# Patient Record
Sex: Female | Born: 1960 | Race: Asian | Hispanic: No | Marital: Married | Smoking: Never smoker
Health system: Southern US, Community
[De-identification: ages and names within clinical notes are randomized; demographics above are authoritative.]

## PROBLEM LIST (undated history)

## (undated) DIAGNOSIS — L719 Rosacea, unspecified: Secondary | ICD-10-CM

## (undated) DIAGNOSIS — I10 Essential (primary) hypertension: Secondary | ICD-10-CM

## (undated) DIAGNOSIS — J45909 Unspecified asthma, uncomplicated: Secondary | ICD-10-CM

## (undated) DIAGNOSIS — T7840XA Allergy, unspecified, initial encounter: Secondary | ICD-10-CM

## (undated) DIAGNOSIS — Z780 Asymptomatic menopausal state: Secondary | ICD-10-CM

## (undated) DIAGNOSIS — K219 Gastro-esophageal reflux disease without esophagitis: Secondary | ICD-10-CM

## (undated) DIAGNOSIS — Z9289 Personal history of other medical treatment: Secondary | ICD-10-CM

## (undated) DIAGNOSIS — E785 Hyperlipidemia, unspecified: Secondary | ICD-10-CM

## (undated) HISTORY — PX: COLONOSCOPY: SHX174

## (undated) HISTORY — PX: HEMORRHOID SURGERY: SHX153

## (undated) HISTORY — PX: UPPER GASTROINTESTINAL ENDOSCOPY: SHX188

## (undated) HISTORY — DX: Gastro-esophageal reflux disease without esophagitis: K21.9

## (undated) HISTORY — DX: Asymptomatic menopausal state: Z78.0

## (undated) HISTORY — DX: Essential (primary) hypertension: I10

## (undated) HISTORY — DX: Hyperlipidemia, unspecified: E78.5

## (undated) HISTORY — PX: OTHER SURGICAL HISTORY: SHX169

## (undated) HISTORY — DX: Unspecified asthma, uncomplicated: J45.909

## (undated) HISTORY — DX: Allergy, unspecified, initial encounter: T78.40XA

## (undated) HISTORY — PX: NASAL SINUS SURGERY: SHX719

## (undated) HISTORY — DX: Personal history of other medical treatment: Z92.89

## (undated) HISTORY — DX: Rosacea, unspecified: L71.9

---

## 1999-11-27 ENCOUNTER — Other Ambulatory Visit: Admission: RE | Admit: 1999-11-27 | Discharge: 1999-11-27 | Payer: Self-pay | Admitting: Obstetrics and Gynecology

## 2004-01-24 ENCOUNTER — Ambulatory Visit (HOSPITAL_COMMUNITY): Admission: RE | Admit: 2004-01-24 | Discharge: 2004-01-24 | Payer: Self-pay | Admitting: Family Medicine

## 2005-01-24 ENCOUNTER — Ambulatory Visit (HOSPITAL_COMMUNITY): Admission: RE | Admit: 2005-01-24 | Discharge: 2005-01-24 | Payer: Self-pay | Admitting: Family Medicine

## 2006-03-06 ENCOUNTER — Encounter (INDEPENDENT_AMBULATORY_CARE_PROVIDER_SITE_OTHER): Payer: Self-pay | Admitting: Specialist

## 2006-03-06 ENCOUNTER — Ambulatory Visit (HOSPITAL_COMMUNITY): Admission: RE | Admit: 2006-03-06 | Discharge: 2006-03-06 | Payer: Self-pay | Admitting: General Surgery

## 2006-03-24 ENCOUNTER — Ambulatory Visit (HOSPITAL_COMMUNITY): Admission: RE | Admit: 2006-03-24 | Discharge: 2006-03-24 | Payer: Self-pay | Admitting: *Deleted

## 2007-02-22 ENCOUNTER — Ambulatory Visit: Payer: Self-pay | Admitting: Internal Medicine

## 2007-02-22 LAB — CONVERTED CEMR LAB
ALT: 34 units/L (ref 0–35)
Albumin: 4.5 g/dL (ref 3.5–5.2)
Basophils Absolute: 0 10*3/uL (ref 0.0–0.1)
CO2: 21 meq/L (ref 19–32)
Chloride: 105 meq/L (ref 96–112)
Glucose, Bld: 118 mg/dL — ABNORMAL HIGH (ref 70–99)
HCT: 41.3 % (ref 36.0–46.0)
Lymphocytes Relative: 33 % (ref 12–46)
Lymphs Abs: 2.9 10*3/uL (ref 0.7–3.3)
Neutro Abs: 4.7 10*3/uL (ref 1.7–7.7)
Neutrophils Relative %: 54 % (ref 43–77)
Platelets: 242 10*3/uL (ref 150–400)
Potassium: 3.7 meq/L (ref 3.5–5.3)
RDW: 14.9 % — ABNORMAL HIGH (ref 11.5–14.0)
Sodium: 137 meq/L (ref 135–145)
TSH: 2.147 microintl units/mL (ref 0.350–5.50)
Total Bilirubin: 0.3 mg/dL (ref 0.3–1.2)
Total Protein: 8.2 g/dL (ref 6.0–8.3)
WBC: 8.8 10*3/uL (ref 4.0–10.5)

## 2007-02-23 ENCOUNTER — Ambulatory Visit: Payer: Self-pay | Admitting: *Deleted

## 2007-05-11 ENCOUNTER — Ambulatory Visit (HOSPITAL_COMMUNITY): Admission: RE | Admit: 2007-05-11 | Discharge: 2007-05-11 | Payer: Self-pay | Admitting: *Deleted

## 2007-05-24 ENCOUNTER — Ambulatory Visit: Payer: Self-pay | Admitting: Internal Medicine

## 2007-05-24 LAB — CONVERTED CEMR LAB
Cholesterol: 261 mg/dL — ABNORMAL HIGH (ref 0–200)
Total CHOL/HDL Ratio: 5.6

## 2008-07-17 ENCOUNTER — Ambulatory Visit (HOSPITAL_COMMUNITY): Admission: RE | Admit: 2008-07-17 | Discharge: 2008-07-17 | Payer: Self-pay | Admitting: Family Medicine

## 2010-07-07 ENCOUNTER — Encounter: Payer: Self-pay | Admitting: Family Medicine

## 2010-07-07 ENCOUNTER — Encounter: Payer: Self-pay | Admitting: *Deleted

## 2010-07-17 DIAGNOSIS — Z9289 Personal history of other medical treatment: Secondary | ICD-10-CM

## 2010-07-17 HISTORY — DX: Personal history of other medical treatment: Z92.89

## 2010-07-23 ENCOUNTER — Ambulatory Visit (INDEPENDENT_AMBULATORY_CARE_PROVIDER_SITE_OTHER): Payer: 59 | Admitting: Internal Medicine

## 2010-07-23 ENCOUNTER — Encounter: Payer: Self-pay | Admitting: Internal Medicine

## 2010-07-23 DIAGNOSIS — J3089 Other allergic rhinitis: Secondary | ICD-10-CM

## 2010-07-23 DIAGNOSIS — R079 Chest pain, unspecified: Secondary | ICD-10-CM

## 2010-07-23 DIAGNOSIS — R42 Dizziness and giddiness: Secondary | ICD-10-CM | POA: Insufficient documentation

## 2010-07-23 DIAGNOSIS — J302 Other seasonal allergic rhinitis: Secondary | ICD-10-CM | POA: Insufficient documentation

## 2010-07-23 DIAGNOSIS — E785 Hyperlipidemia, unspecified: Secondary | ICD-10-CM | POA: Insufficient documentation

## 2010-07-24 ENCOUNTER — Ambulatory Visit (INDEPENDENT_AMBULATORY_CARE_PROVIDER_SITE_OTHER)
Admission: RE | Admit: 2010-07-24 | Discharge: 2010-07-24 | Disposition: A | Discharge status: Home / Self Care | Payer: 59 | Source: Ambulatory Visit | Attending: Internal Medicine | Admitting: Internal Medicine

## 2010-07-24 ENCOUNTER — Other Ambulatory Visit: Payer: Self-pay | Admitting: Internal Medicine

## 2010-07-24 DIAGNOSIS — R079 Chest pain, unspecified: Secondary | ICD-10-CM

## 2010-07-26 ENCOUNTER — Other Ambulatory Visit (INDEPENDENT_AMBULATORY_CARE_PROVIDER_SITE_OTHER): Payer: 59

## 2010-07-26 ENCOUNTER — Encounter (INDEPENDENT_AMBULATORY_CARE_PROVIDER_SITE_OTHER): Payer: Self-pay | Admitting: *Deleted

## 2010-07-26 ENCOUNTER — Other Ambulatory Visit: Payer: Self-pay | Admitting: Internal Medicine

## 2010-07-26 DIAGNOSIS — R079 Chest pain, unspecified: Secondary | ICD-10-CM

## 2010-07-26 DIAGNOSIS — E785 Hyperlipidemia, unspecified: Secondary | ICD-10-CM

## 2010-07-26 LAB — BASIC METABOLIC PANEL
Calcium: 8.8 mg/dL (ref 8.4–10.5)
GFR: 121.1 mL/min (ref >60.00)
Potassium: 4.5 mEq/L (ref 3.5–5.1)
Sodium: 136 mEq/L (ref 135–145)

## 2010-07-26 LAB — LIPID PANEL
Cholesterol: 235 mg/dL — ABNORMAL HIGH (ref 0–200)
HDL: 57.6 mg/dL (ref >39.00)
Total CHOL/HDL Ratio: 4
Triglycerides: 83 mg/dL (ref 0.0–149.0)

## 2010-07-26 LAB — CBC WITH DIFFERENTIAL/PLATELET
Basophils Absolute: 0 10*3/uL (ref 0.0–0.1)
HCT: 39.2 % (ref 36.0–46.0)
Lymphs Abs: 2.3 10*3/uL (ref 0.7–4.0)
MCV: 87.7 fl (ref 78.0–100.0)
Monocytes Absolute: 0.5 10*3/uL (ref 0.1–1.0)
Monocytes Relative: 7.2 % (ref 3.0–12.0)
Neutrophils Relative %: 60.3 % (ref 43.0–77.0)
Platelets: 282 10*3/uL (ref 150.0–400.0)
RDW: 13.6 % (ref 11.5–14.6)
WBC: 7.4 10*3/uL (ref 4.5–10.5)

## 2010-07-29 ENCOUNTER — Encounter (INDEPENDENT_AMBULATORY_CARE_PROVIDER_SITE_OTHER): Payer: Self-pay | Admitting: *Deleted

## 2010-07-29 ENCOUNTER — Telehealth: Payer: Self-pay | Admitting: Internal Medicine

## 2010-07-30 ENCOUNTER — Encounter (INDEPENDENT_AMBULATORY_CARE_PROVIDER_SITE_OTHER): Payer: Self-pay | Admitting: *Deleted

## 2010-08-01 NOTE — Assessment & Plan Note (Signed)
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 her BP was elevated at 184/84.  Since that episode, she had   3 or 4 more episodes of dizziness and chest discomfort, usually at work when she is active. No symptoms at rest  The discomfort was mild, located at the left anterior chest, usually lasts one minute, sometimes she feels like it radiates to the left shoulder and the neck symptoms are not associated with palpitations or SOB Worse with eating? Her BP has been rechecked by herself several times and is within normal.  ROS No fever She has developed cough but only for the last 3 days She has developed heartburn and some upper abdominal discomfort but only for the last 2 days No nausea, vomiting, diarrhea No lower extremity edema or leg pain. No recent airplane trips No slurred speech, face weakness or motor deficits LMP 1 week ago   Preventive Screening-Counseling & Management  Alcohol-Tobacco     Smoking Status: never  Caffeine-Diet-Exercise     Does Patient Exercise: no      Drug Use:  no.    Current Medications (verified): 1)  None  Allergies (verified): 1)  ! Pcn  Past History:  Family History: Last updated: 07/23/2010 Diabetes -- M High cholesterol-mother Hypertension--mother stroke-- M MI--no Breast cancer----sister dx  ~ age 17  Gastric  Cancer-Father  Social History: Last updated: 07/23/2010 Married 2 children, 4 and 17 y/o Never Smoked Alcohol use-no Drug use-no Regular exercise-no but very active at work  Risk Factors: Exercise: no (07/23/2010)  Risk Factors: Smoking Status: never (07/23/2010)  Past Medical History: Allergic rhinitis Hyperlipidemia, mild not on BCP  Past Surgical History: "eardrum" surgery sinus surgery   Family History: Diabetes -- M High cholesterol-mother Hypertension--mother stroke-- M MI--no Breast cancer----sister dx  ~ age 43  Gastric Cancer-Father  Social History: Married 2 children, 9 and 34 y/o Never Smoked Alcohol use-no Drug use-no Regular exercise-no but very active at work Smoking Status:  never Drug Use:  no Does Patient Exercise:  no  Physical Exam  General:  alert and well-developed.   Neck:  no masses and no thyromegaly.   Lungs:  normal respiratory effort, no intercostal retractions, no accessory muscle use, and normal breath sounds.   Heart:  normal rate, regular rhythm, no murmur, and no gallop.   Abdomen:  soft, non-tender, no distention, no masses, no guarding, and no rigidity.   Extremities:  no lower extremity edema, calves  are symmetric and not tender Neurologic:  alert & oriented X3, cranial nerves II-XII intact, strength normal in all extremities, and gait normal.   Psych:  Oriented X3, memory intact for recent and remote, normally interactive, good eye contact, not anxious appearing, and not depressed appearing.  Impression & Recommendations:  Problem # 1:  CHEST PAIN (ICD-26.78)  51 year old lady who presents with episodes of dizziness and chest pain. Her cardiovascular risk are  : sedentary lifestyle, history of high cholesterol. no family history of MI, she does not smoke  EKG today -- no acute or worrisome changes  Plan: chest x-ray Labs Aspirin daily Stress test ER if symptoms severe.  Orders: T-2 View CXR  (71020TC) Cardiology Referral (Cardiology)  Problem # 2:  DIZZINESS (ICD-780.4) see #1, she has dizziness in the context of many other symptoms including chest pain. Neurological exam normal.  Complete Medication List: 1)  Aspirin 81 Mg Tbec (Aspirin) .... One by mouth daily  Patient Instructions: 1)  chest x-ray 2)  please contact fasting Within one week 3)  FLP, CBC, BMP--- dx CP 4)  call or go to the ER if you have  severe dizziness or chest pain 5)  Please schedule a follow-up appointment in 1 month.    Orders Added: 1)  T-2 View CXR [71020TC] 2)  New Patient Level III [[address]] 3)  Cardiology Referral [Cardiology]     Risk Factors:  Tobacco use:  never Drug use:  no Alcohol use:  no Exercise:  no

## 2010-08-06 ENCOUNTER — Telehealth (INDEPENDENT_AMBULATORY_CARE_PROVIDER_SITE_OTHER): Payer: Self-pay | Admitting: *Deleted

## 2010-08-07 ENCOUNTER — Encounter: Payer: Self-pay | Admitting: Cardiovascular Disease

## 2010-08-07 ENCOUNTER — Ambulatory Visit (HOSPITAL_COMMUNITY): Payer: 59 | Attending: Cardiology

## 2010-08-07 DIAGNOSIS — R079 Chest pain, unspecified: Secondary | ICD-10-CM

## 2010-08-07 NOTE — Letter (Signed)
Summary: Unable To Reach-Consult Scheduled  [facility] [place]  [address] [place], [place] [address]   Phone: [phone]  Fax: [phone]    07/29/2010 MRN: [id number]    Dear Ms. Ciaravino,   We have been unable to reach you by phone.  Please contact our office with an updated phone number.  If you have any question please call us.     Thank you,  Army Fossa CMA  July 29, 2010 11:17 AM

## 2010-08-07 NOTE — Progress Notes (Signed)
Summary: PEER TO PEER REQUEST  Phone Note Outgoing Call   Call placed by: Magdalen Spatz [facility],  July 29, 2010 10:09 AM Call placed to: Database administrator of Call: IN REFERENCE TO CARDIO/NUC STRESS TEST REFERRAL, [employer] INS[employer] DID NOT APPROVE BY PHONE.  ALL CLINICALS WERE FAXED TO CLINICAL REVIEW.  PER PHONE CALL W/SECURE HORIZONS, CLINICALS DID NOT MEET GUIDELINES FOR APPROVAL, NOW REQUIRING "PEER TO PEER", BY CALLING [phone], OPTION 4.  Initial call taken by: Magdalen Spatz [facility]  July 29, 2010 10:09 AM  Follow-up for Phone Call        please call and let me talk to them [facility] E. Osei Anger MD  July 29, 2010 1:08 PM   Additional Follow-up for Phone Call Additional follow up Details #1::        Renee-- do you have the case #, they want let me do anything without this. Army Fossa CMA,  July 29, 2010 4:25 PM  THE CASE # IS [id number]. Magdalen Spatz [facility]  July 29, 2010 4:36 PM     Additional Follow-up for Phone Call Additional follow up Details #2::    discussed with the company doctor, test approved. The approval number is as follows: [id number] please schedule test [facility] E. Prateek Knipple MD  July 29, 2010 4:47 PM

## 2010-08-07 NOTE — Letter (Signed)
Summary: Primary Care Consult Scheduled Letter  [facility] [place]  [address] [place], [place] [address]   Phone: [phone]  Fax: [phone]      07/30/2010 MRN: [id number]  [facility] [address] [place], [place]  [address]  [place]    Dear Ms. Pelzer,    We have scheduled an appointment for you.  At the recommendation of Dr. Willow Ora, we have scheduled you for a Cardiac Stress Test with Selena Batten on 08-07-2010 at 12:30pm.  Their address is [address], [address] floor, [place] [address]. The office phone number is [phone].  If this appointment day and time is not convenient for you, please feel free to call the office of the doctor you are being referred to at the number listed above and reschedule the appointment.    It is important for you to keep your scheduled appointments. We are here to make sure you are given good patient care.   Thank you,    Renee, Patient Care Coordinator [place]

## 2010-08-08 ENCOUNTER — Telehealth: Payer: Self-pay | Admitting: Internal Medicine

## 2010-08-13 NOTE — Progress Notes (Signed)
Summary: Nuclear Pre-Procedure  Phone Note Outgoing Call   Call placed by: Stanton Kidney, EMT-P,  August 06, 2010 2:54 PM Summary of Call: No Current Phone Number.... Unable to leave message with information on Myoview Information Sheet (see scanned document for details). Stanton Kidney, EMT-P  August 06, 2010 2:54 PM      Nuclear Med Background Indications for Stress Test: Evaluation for Ischemia     Symptoms: Chest Pain, Diaphoresis, Dizziness    Nuclear Pre-Procedure Height (in): 60.25

## 2010-08-13 NOTE — Progress Notes (Signed)
Summary: Updated info  Phone Note Call from Patient Call back at Home Phone [phone]   Summary of Call: Patient called the office stating that she was returning a call. Please advise. Initial call taken by: Lucious Groves CMA,  August 08, 2010 9:32 AM  Follow-up for Phone Call        Left message for pt to call back-- I do not see where anyone has called the pt. Army Fossa CMA  August 08, 2010 9:35 AM   Additional Follow-up for Phone Call Additional follow up Details #1::        Patient contact info had changed and was corrected. No further action required.  Additional Follow-up by: Lucious Groves CMA,  August 08, 2010 11:30 AM

## 2010-08-13 NOTE — Assessment & Plan Note (Addendum)
Summary: Cardiology Nuclear Testing--negative,+ hypertension  Nuclear Med Background Indications for Stress Test: Evaluation for Ischemia   History: Asthma   Symptoms: Chest Pain, Chest Tightness with Exertion, Diaphoresis, Dizziness, DOE, Fatigue, Fatigue with Exertion, Rapid HR, SOB  Symptoms Comments: Last CP 3 weeks ago.   Nuclear Pre-Procedure Cardiac Risk Factors: Hypertension, Lipids, NIDDM Caffeine/Decaff Intake: 530AM NPO After: 12:00 PM Lungs: Clear IV 0.9% NS with Angio Cath: 22g     IV Site: R Hand IV Started by: Bonnita Levan, RN Chest Size (in) 38     Cup Size D     Height (in): 60.25 Weight (lb): 152 BMI: 29.55  Nuclear Med Study 1 or 2 day study:  1 day     Stress Test Type:  Stress Reading MD:  Charlton Haws, MD     Referring MD:  J.Paz Resting Radionuclide:  Technetium 70m Tetrofosmin     Resting Radionuclide Dose:  11 mCi  Stress Radionuclide:  Technetium 25m Tetrofosmin     Stress Radionuclide Dose:  33 mCi   Stress Protocol Exercise Time (min):  8:00 min     Max HR:  160 bpm     Predicted Max HR:  173 bpm  Max Systolic BP: 218 mm Hg     Percent Max HR:  92.49 %     METS: 10.1 Rate Pressure Product:  04540    Stress Test Technologist:  Irean Hong,  RN     Nuclear Technologist:  Doyne Keel, CNMT  Rest Procedure  Myocardial perfusion imaging was performed at rest 45 minutes following the intravenous administration of Technetium 67m Tetrofosmin.  Stress Procedure  The patient exercised for eight minutes, RPE=15.  The patient stopped due to DOE   and denied any chest pain.  There were nonspecific ST-T wave changes, rare PVC. The patient had a hypertensive response to exercise.  Technetium 72m Tetrofosmin was injected at peak exercise and myocardial perfusion imaging was performed after a brief delay.  QPS Raw Data Images:  Normal; no motion artifact; normal heart/lung ratio. Stress Images:  Normal homogeneous uptake in all areas of the  myocardium. Rest Images:  Normal homogeneous uptake in all areas of the myocardium. Subtraction (SDS):  Normal Transient Ischemic Dilatation:  .90  (Normal <1.22)  Lung/Heart Ratio:  .34  (Normal <0.45)  Quantitative Gated Spect Images QGS EDV:  48 ml QGS ESV:  12 ml QGS EF:  76 % QGS cine images:  normal  Findings Normal nuclear study      Overall Impression  Exercise Capacity: Good exercise capacity. BP Response: Hypertensive blood pressure response. Clinical Symptoms: Dyspnea ECG Impression: No significant ST segment change suggestive of ischemia. Overall Impression: Normal stress nuclear study. Overall Impression Comments: Hypertensive at rest and stess  Appended Document: Cardiology Nuclear Testing advise patient: stress test was negative Followup next month as recommended  Appended Document: Stress Test   left message for pt to call back. Army Fossa CMA  August 12, 2010 10:13 AM  Patient notified. Lucious Groves CMA  August 12, 2010 4:39 PM

## 2010-08-23 ENCOUNTER — Ambulatory Visit (INDEPENDENT_AMBULATORY_CARE_PROVIDER_SITE_OTHER): Payer: 59 | Admitting: Internal Medicine

## 2010-08-23 ENCOUNTER — Encounter: Payer: Self-pay | Admitting: Internal Medicine

## 2010-08-23 DIAGNOSIS — I10 Essential (primary) hypertension: Secondary | ICD-10-CM

## 2010-08-23 DIAGNOSIS — E119 Type 2 diabetes mellitus without complications: Secondary | ICD-10-CM | POA: Insufficient documentation

## 2010-08-23 DIAGNOSIS — R7309 Other abnormal glucose: Secondary | ICD-10-CM

## 2010-08-23 DIAGNOSIS — R079 Chest pain, unspecified: Secondary | ICD-10-CM

## 2010-09-03 NOTE — Assessment & Plan Note (Signed)
Summary: 1 month followup///sph   Vital Signs:  Patient profile:   50 year old female Weight:      155.38 pounds Pulse rate:   86 / minute Pulse rhythm:   regular BP sitting:   126 / 84  (left arm) Cuff size:   regular  Vitals Entered By: Army Fossa CMA (August 23, 2010 9:02 AM) CC: 1 Month f/u- not fasting  Comments [employer]   History of Present Illness:  followup  she was seen recently with a number of symptoms including chest pain and dizziness Since the last office visit, no further symptoms.  she is doing better as far as diet    review of systems   denies anxiety  she occasionally has hot flashes  Has not checked her blood pressure lately    Current Medications (verified): 1)  Aspirin 81 Mg Tbec (Aspirin) .... One By Mouth Daily  Allergies (verified): 1)  ! Pcn  Past History:  Past Medical History: Reviewed history from 07/23/2010 and no changes required. Allergic rhinitis Hyperlipidemia, mild not on BCP  Past Surgical History: Reviewed history from 07/23/2010 and no changes required. "eardrum" surgery sinus surgery   Social History: Reviewed history from 07/23/2010 and no changes required. Married 2 children, 4 and 59 y/o Never Smoked Alcohol use-no Drug use-no Regular exercise-no but very active at work  Physical Exam  General:  alert and well-developed.   Lungs:  normal respiratory effort, no intercostal retractions, no accessory muscle use, and normal breath sounds.   Heart:  normal rate, regular rhythm, no murmur, and no gallop.     Impression & Recommendations:  Problem # 1:  CHEST PAIN (ICD-786.50)  recent episodes of chest pain and dizziness, resolved stress test negative, she did have increased blood pressure at that time.  plan: No further workup,  I wonder if the symptoms were anxiety related or hot  flashes.  Problem # 2:  HYPERLIPIDEMIA (ICD-272.4)  her last  cholesterol numbers were discussed, recommend diet and  exercise Labs Reviewed: SGOT: 30 (02/22/2007)   SGPT: 34 (02/22/2007)   HDL:57.60 (07/26/2010), 47 (05/24/2007)  LDL:172 (05/24/2007)  Chol:235 (07/26/2010), 261 (05/24/2007)  Trig:83.0 (07/26/2010), 210 (05/24/2007)  Problem # 3:  HYPERGLYCEMIA (ICD-790.29)  mild hyperglycemia, patient aware, recommend diet and exercise  Problem # 4:  ESSENTIAL HYPERTENSION, BENIGN (ICD-401.1)   mild elevated blood pressure  note that during her stress test Start on low-dose of coreg , see instructions  Her updated medication list for this problem includes:    Carvedilol 6.25 Mg Tabs (Carvedilol) .Marland Kitchen... 1 by mouth two times a day  Complete Medication List: 1)  Aspirin 81 Mg Tbec (Aspirin) .... One by mouth daily 2)  Carvedilol 6.25 Mg Tabs (Carvedilol) .Marland Kitchen.. 1 by mouth two times a day  Patient Instructions: 1)  Check your blood pressure 2 or 3 times a week. If it is more than 140/85 consistently,please let us know  2)  Please schedule a follow-up appointment in 4 months . Fasting physical  Prescriptions: CARVEDILOL 6.25 MG TABS (CARVEDILOL) 1 by mouth two times a day  #60 x 6   Entered and Authorized by:   Elita Quick E. Shadee Rathod MD   Signed by:   Nolon Rod. Mayara Paulson MD on 08/23/2010   Method used:   Print then Give to Patient   RxID:   [id number]    Orders Added: 1)  Est. Patient Level III [[address]]

## 2010-11-01 NOTE — Op Note (Signed)
Barbara Flores, Barbara Flores               ACCOUNT NO.:  [id number]   MEDICAL RECORD NO.:  [id number]          PATIENT TYPE:  AMB   LOCATION:  DAY                          FACILITY:  [facility]   PHYSICIAN:  Timothy E. Earlene Plater, M.D. DATE OF BIRTH:  [birth year]   DATE OF PROCEDURE:  03/08/2006  DATE OF DISCHARGE:  03/06/2006                                 OPERATIVE REPORT   PREOPERATIVE DIAGNOSIS:  Internal and external hemorrhoids.   POSTOPERATIVE DIAGNOSIS:  Internal and external hemorrhoids.   PROCEDURE:  Complex hemorrhoidectomy.   SURGEON:  Timothy E. Earlene Plater, M.D.   ANESTHESIA:  General.   This patient is otherwise healthy, perhaps overweight.  She does not give a  GI history that is abnormal, and she has since pregnancies developed rather  large, unpleasant hemorrhoids with frequent pain, swelling, burning and  bright red blood.  After careful discussion, she wishes to proceed with this  surgery.  It has been carefully discussed.  She was seen, identified and a  permit signed today.   She was taken to the operating room and placed supine and LMA anesthesia  provided.  She was placed in lithotomy, the perianal area inspected, prepped  and draped in the usual fashion.  The hemorrhoidal component of the problem  was extensive with a large, shaggy right anterior external hemorrhoid  extending across the anterior midline.  There was also an internal  component.  Left anterior there was a small internal and a moderate  external, and left posterior there was a large external.  I injected the  anus around and about with Marcaine, epinephrine and Wydase and massaged  that in well.  I placed the anoscope in the anorectum and then removed the  larger right anterior internal and external component, modified the external  component to remove the remaining excess skin along the anterior rim of the  anus.  The left anterior internal hemorrhoid was removed and oversewn with a  2-0 chromic.  The right  anterior hemorrhoid had been repaired with 2-0  chromic and 3-0 chromic external.  The large flap of skin in the left  posterior area was all external.  I incised it.  I removed all the  superficial varicosities and then trimmed the edges so that they would lay  smooth and flat at the anal opening.  This was closed also with 3-0 chromic.  This completed the procedure.  The anus was adequate, accepting the  operating anoscope.  The sphincter was intact.  There was no bleeding.  Gelfoam and dry sterile dressing applied.  She tolerated it well, count was  correct, and she was removed to the recovery room in good condition.   Written and verbal instructions were given, including Vicodin #48.  She will  be seen and followed as an outpatient.      Timothy E. Earlene Plater, M.D.  Electronically Signed    TED/MEDQ  D:  03/06/2006  T:  03/09/2006  Job:  [id number]

## 2010-12-23 ENCOUNTER — Encounter: Payer: Self-pay | Admitting: Internal Medicine

## 2010-12-23 ENCOUNTER — Ambulatory Visit (INDEPENDENT_AMBULATORY_CARE_PROVIDER_SITE_OTHER): Payer: 59 | Admitting: Internal Medicine

## 2010-12-23 DIAGNOSIS — R7309 Other abnormal glucose: Secondary | ICD-10-CM

## 2010-12-23 DIAGNOSIS — I10 Essential (primary) hypertension: Secondary | ICD-10-CM

## 2010-12-23 DIAGNOSIS — Z0000 Encounter for general adult medical examination without abnormal findings: Secondary | ICD-10-CM

## 2010-12-23 DIAGNOSIS — Z23 Encounter for immunization: Secondary | ICD-10-CM

## 2010-12-23 DIAGNOSIS — R079 Chest pain, unspecified: Secondary | ICD-10-CM

## 2010-12-23 DIAGNOSIS — R739 Hyperglycemia, unspecified: Secondary | ICD-10-CM

## 2010-12-23 LAB — TSH: TSH: 1.18 u[IU]/mL (ref 0.35–5.50)

## 2010-12-23 LAB — AST: AST: 21 U/L (ref 0–37)

## 2010-12-23 LAB — HEMOGLOBIN A1C: Hgb A1c MFr Bld: 6.2 % (ref 4.6–6.5)

## 2010-12-23 LAB — LDL CHOLESTEROL, DIRECT: Direct LDL: 206.5 mg/dL

## 2010-12-23 LAB — ALT: ALT: 20 U/L (ref 0–35)

## 2010-12-23 NOTE — Progress Notes (Signed)
  Subjective:    Patient ID: Barbara Flores, female    DOB: 28-May-1961, 50 y.o.   MRN: [id number]  HPI Complete physical exam, doing well  Past Medical History  Diagnosis Date  . Allergic rhinitis   . Hyperlipidemia   . History of exercise stress test 07-2010    normal except for elevated BP    Past Surgical History  Procedure Date  . Eardrum surgery   . Nasal sinus surgery     Family History: Diabetes -- M High cholesterol-mother Hypertension--mother stroke-- M MI--no Breast cancer----sister dx ~ age 73  Gastric Cancer-Father Colon ca--no  Social History: Married 2 children, 16 and 107 y/o Never Smoked Alcohol use-no Drug use-no Regular exercise-no but very active at work Smoking Status:  never Drug Use:  no Does Patient Exercise:  no  Review of Systems Ambulatory blood pressure is 138-140/70 Checks her BP several times a week Was recently seen with chest pain and dizziness, stress test negative, no further symptoms. No nausea, vomiting, diarrhea or blood in the stools. No GERD symptoms or abdominal pain. No dysuria or gross hematuria, and uterus are normal and regular. Lost 3 pounds in the last 4 months, is trying to eat better. She remains active at work, no routine exercises .      Objective:   Physical Exam  Constitutional: She is oriented to person, place, and time. She appears well-developed and well-nourished. No distress.  HENT:  Head: Normocephalic and atraumatic.  Neck: No thyromegaly present.  Cardiovascular: Normal rate, regular rhythm and normal heart sounds.   No murmur heard. Pulmonary/Chest: Effort normal and breath sounds normal. No respiratory distress. She has no wheezes. She has no rales.  Abdominal: Soft. She exhibits no distension. There is no tenderness. There is no rebound and no guarding.  Musculoskeletal: She exhibits no edema.  Lymphadenopathy:    She has no cervical adenopathy.  Neurological: She is alert and oriented to person,  place, and time.  Skin: Skin is warm and dry.  Psychiatric: She has a normal mood and affect. Her behavior is normal. Judgment and thought content normal.          Assessment & Plan:

## 2010-12-23 NOTE — Assessment & Plan Note (Addendum)
Td today Never had a colonoscopy, no family history of colon cancer Has not seen her gynecologist since 2008, she has a family history of breast cancer, strongly encouraged to see her gynecologist. Schedule mammogram? States she will call and make an appointment History of mildly elevated cholesterol and blood sugar---->  will check labs.  She has already starting to eating better and loose weight. Encouraged to continue doing so.

## 2010-12-23 NOTE — Patient Instructions (Signed)
Continue eating healthy and loos weight Check the  blood pressure 2 or 3 times a week, be sure it is less than 140/85. If it is consistently higher, let me know

## 2010-12-23 NOTE — Assessment & Plan Note (Signed)
Symptoms resolve, stress test negative-2012. She did have slightly elevated blood pressure

## 2010-12-23 NOTE — Assessment & Plan Note (Addendum)
Mildly elevated BP at time of stress test. Ambulatory BPs ok, See instructions

## 2010-12-26 ENCOUNTER — Telehealth: Payer: Self-pay | Admitting: *Deleted

## 2010-12-26 NOTE — Telephone Encounter (Signed)
Pt states that she has s/e to Lipitor she has been on this in the past.

## 2010-12-26 NOTE — Telephone Encounter (Signed)
Message left for patient to return my call.  

## 2010-12-26 NOTE — Telephone Encounter (Signed)
Message copied by Leanne Lovely on Thu Dec 26, 2010  3:09 PM ------      Message from: Barbara Flores      Created: Wed Dec 25, 2010  5:38 PM       Advised patient:      Her cholesterol is definitely increased, the bad cholesterol , LDL, has gotten worse. Recommend to start Lipitor 20 mg one by mouth each bedtime; she's not on birth control, needs to stop Lipitor if she suspected pregnancy.       She also has developed mild diabetes, she does not need medication but needs to improve her diet. Please arrange for dietitian referral.      Recommend to come back to the office in 3 months for a routine office visit, fasting.      Other labs wnl

## 2010-12-27 NOTE — Telephone Encounter (Signed)
Message left for patient to return my call.  

## 2010-12-27 NOTE — Telephone Encounter (Signed)
Change to pravachol 40 mg 1 po qhs

## 2010-12-30 MED ORDER — PRAVASTATIN SODIUM 40 MG PO TABS: 40.0000 mg | ORAL_TABLET | Freq: Every evening | ORAL | Status: DC

## 2010-12-30 NOTE — Telephone Encounter (Signed)
Pt is aware. She is also taking Red Yeast rice would like for me to let you know.

## 2010-12-30 NOTE — Telephone Encounter (Signed)
noted 

## 2011-03-23 ENCOUNTER — Other Ambulatory Visit: Payer: Self-pay | Admitting: Internal Medicine

## 2011-03-24 NOTE — Telephone Encounter (Signed)
Done

## 2011-04-17 ENCOUNTER — Ambulatory Visit (INDEPENDENT_AMBULATORY_CARE_PROVIDER_SITE_OTHER): Payer: BC Managed Care – PPO | Admitting: Internal Medicine

## 2011-04-17 ENCOUNTER — Encounter: Payer: Self-pay | Admitting: Internal Medicine

## 2011-04-17 VITALS — BP 128/70 | HR 69 | Temp 98.3°F | Resp 18 | Ht 60.0 in | Wt 148.1 lb

## 2011-04-17 DIAGNOSIS — J069 Acute upper respiratory infection, unspecified: Secondary | ICD-10-CM | POA: Insufficient documentation

## 2011-04-17 DIAGNOSIS — E785 Hyperlipidemia, unspecified: Secondary | ICD-10-CM

## 2011-04-17 DIAGNOSIS — E119 Type 2 diabetes mellitus without complications: Secondary | ICD-10-CM

## 2011-04-17 NOTE — Assessment & Plan Note (Signed)
Report that previously Lipitor causes headaches Pravachol caused feet cramps, self discontinued around 03-2011 She also discontinue red yeast rice Plan: Change to crestor, samples provided, reports no side effects from previous experience

## 2011-04-17 NOTE — Assessment & Plan Note (Signed)
Has changed her diet, is losing weight. Doing great

## 2011-04-17 NOTE — Assessment & Plan Note (Addendum)
Having a URI (?), see instructions. Also her BP was elevated today, related to over-the-counter medications?Marland Kitchen Would recommend simply Mucinex DM.

## 2011-04-17 NOTE — Progress Notes (Signed)
  Subjective:    Patient ID: Barbara Flores, female    DOB: [birth year], 50 y.o.   MRN: [id number]  HPI she developed what she thinks is a cold 2 days ago: Ringing in both ears, slightly dizzy, started to take TheraFlu over-the-counter. This morning she checked her blood pressure and reportedly was 229/84, she is here because he is concerned about her blood pressure. Additionally, she discontinue Pravachol due to  feet cramps  Past Medical History  Diagnosis Date  . Allergic rhinitis   . Hyperlipidemia   . History of exercise stress test 07-2010    normal except for elevated BP  . Diabetes mellitus 08/23/2010   Past Surgical History  Procedure Date  . Eardrum surgery   . Nasal sinus surgery     Review of Systems No fever, mild chills. No nausea, vomiting, cough. Very mild headache, no chest pain.    Objective:   Physical Exam  Constitutional: She is oriented to person, place, and time. She appears well-developed and well-nourished. No distress.  HENT:  Head: Normocephalic and atraumatic.       Face is symmetric, nontender to palpation. Tympanic membranes without redness or discharge. Nose is slightly congested. Throat without redness.  Eyes: EOM are normal. Pupils are equal, round, and reactive to light.  Cardiovascular: Normal rate, regular rhythm and normal heart sounds.   No murmur heard. Pulmonary/Chest: Effort normal and breath sounds normal. No respiratory distress. She has no wheezes. She has no rales.  Neurological: She is alert and oriented to person, place, and time.       Speech, gait and motor are normal  Skin: She is not diaphoretic.  Psychiatric: She has a normal mood and affect. Her behavior is normal. Judgment and thought content normal.        Assessment & Plan:

## 2011-04-17 NOTE — Patient Instructions (Signed)
Rest, fluids , tylenol For cough, take Mucinex DM twice a day as needed  Call if no better in few days Call anytime if the symptoms are severe Check the  blood pressure 2   times a day, be sure it is less than 140/85. If it is consistently higher, let me know Change your follow up to 1 month from today

## 2011-04-28 ENCOUNTER — Other Ambulatory Visit: Payer: Self-pay | Admitting: Internal Medicine

## 2011-04-30 ENCOUNTER — Ambulatory Visit (INDEPENDENT_AMBULATORY_CARE_PROVIDER_SITE_OTHER): Payer: BC Managed Care – PPO | Admitting: Internal Medicine

## 2011-04-30 ENCOUNTER — Encounter: Payer: Self-pay | Admitting: Internal Medicine

## 2011-04-30 ENCOUNTER — Ambulatory Visit: Payer: 59 | Admitting: Internal Medicine

## 2011-04-30 ENCOUNTER — Ambulatory Visit (INDEPENDENT_AMBULATORY_CARE_PROVIDER_SITE_OTHER)
Admission: RE | Admit: 2011-04-30 | Discharge: 2011-04-30 | Disposition: A | Discharge status: Home / Self Care | Payer: BC Managed Care – PPO | Source: Ambulatory Visit | Attending: Internal Medicine | Admitting: Internal Medicine

## 2011-04-30 VITALS — BP 140/84 | Temp 98.4°F | Ht 59.0 in | Wt 149.2 lb

## 2011-04-30 DIAGNOSIS — M79673 Pain in unspecified foot: Secondary | ICD-10-CM

## 2011-04-30 DIAGNOSIS — M79609 Pain in unspecified limb: Secondary | ICD-10-CM

## 2011-04-30 MED ORDER — CARVEDILOL 6.25 MG PO TABS: 6.2500 mg | ORAL_TABLET | Freq: Every day | ORAL | Status: DC

## 2011-04-30 MED ORDER — MELOXICAM 7.5 MG PO TABS: 7.5000 mg | ORAL_TABLET | Freq: Every day | ORAL | Status: DC

## 2011-04-30 NOTE — Patient Instructions (Signed)
Wide shoes, XR meloxicam as needed, take with food because may upset the stomach; stop it if problems  Call if no better

## 2011-04-30 NOTE — Progress Notes (Signed)
  Subjective:    Patient ID: Barbara Flores, female    DOB: 25-Nov-1960, 50 y.o.   MRN: [id number]  HPI R foot pain x 2 months, located at the plantar area, no swelling or redness  Past Medical History  Diagnosis Date  . Allergic rhinitis   . Hyperlipidemia   . History of exercise stress test 07-2010    normal except for elevated BP  . Diabetes mellitus 08/23/2010     Review of Systems No acute injury but works 7 d/week, does a lot of walking    Objective:   Physical Exam  Constitutional: She is oriented to person, place, and time. She appears well-developed and well-nourished.  HENT:  Head: Normocephalic and atraumatic.  Cardiovascular:       Normal pedal pulses   Musculoskeletal: She exhibits no edema.       Feet:       Feet, ankles: FROM  Neurological: She is alert and oriented to person, place, and time.          Assessment & Plan:  Foot pain: Has pesplanus, rec shoe insert, wide shoes XR to r/o stress fracture Pain is too proximal to be typical morton's neuroma If no better: ortho?

## 2011-04-30 NOTE — Telephone Encounter (Signed)
Rx sent to pharmacy   

## 2011-05-02 ENCOUNTER — Telehealth: Payer: Self-pay

## 2011-05-02 NOTE — Telephone Encounter (Signed)
Message copied by Beverely Low on Fri May 02, 2011 11:04 AM ------      Message from: Willow Ora E      Created: Thu May 01, 2011  5:56 PM       Advised patient, x-ray negative

## 2011-05-02 NOTE — Telephone Encounter (Signed)
Left message on personally identified voicemail to notify pt x-ray nl

## 2011-06-23 ENCOUNTER — Ambulatory Visit: Payer: Self-pay | Admitting: Internal Medicine

## 2011-07-11 ENCOUNTER — Other Ambulatory Visit: Payer: Self-pay | Admitting: Internal Medicine

## 2011-07-11 NOTE — Telephone Encounter (Signed)
Refill done.  

## 2011-08-01 ENCOUNTER — Encounter: Payer: Self-pay | Admitting: *Deleted

## 2011-08-01 ENCOUNTER — Ambulatory Visit (INDEPENDENT_AMBULATORY_CARE_PROVIDER_SITE_OTHER): Payer: BC Managed Care – PPO | Admitting: Internal Medicine

## 2011-08-01 VITALS — BP 112/64 | HR 85 | Temp 98.2°F | Wt 144.0 lb

## 2011-08-01 DIAGNOSIS — E785 Hyperlipidemia, unspecified: Secondary | ICD-10-CM

## 2011-08-01 DIAGNOSIS — Z0289 Encounter for other administrative examinations: Secondary | ICD-10-CM

## 2011-08-01 DIAGNOSIS — Z780 Asymptomatic menopausal state: Secondary | ICD-10-CM | POA: Insufficient documentation

## 2011-08-01 DIAGNOSIS — N951 Menopausal and female climacteric states: Secondary | ICD-10-CM

## 2011-08-01 DIAGNOSIS — E119 Type 2 diabetes mellitus without complications: Secondary | ICD-10-CM

## 2011-08-01 DIAGNOSIS — K5289 Other specified noninfective gastroenteritis and colitis: Secondary | ICD-10-CM

## 2011-08-01 LAB — HEMOGLOBIN A1C
Hgb A1c MFr Bld: 5.9 % — ABNORMAL HIGH (ref <5.7)
Mean Plasma Glucose: 123 mg/dL — ABNORMAL HIGH (ref <117)

## 2011-08-01 MED ORDER — PROMETHAZINE HCL 12.5 MG PO TABS: 12.5000 mg | ORAL_TABLET | Freq: Four times a day (QID) | ORAL | Status: DC | PRN

## 2011-08-01 MED ORDER — ROSUVASTATIN CALCIUM 20 MG PO TABS: 10.0000 mg | ORAL_TABLET | Freq: Every day | ORAL | Status: DC

## 2011-08-01 NOTE — Assessment & Plan Note (Signed)
Saw gyn today, to start BCP for menopause soon

## 2011-08-01 NOTE — Assessment & Plan Note (Signed)
Off crestor x 2 months, RF, labs on RTC

## 2011-08-01 NOTE — Patient Instructions (Signed)
Rest , lots of clear fluids, bland diet until better Call if no better in 2 days, fever, severe symptoms, blood in the stools  Phenergan as needed for nausea, will make you sleepy Do not go to work until better

## 2011-08-01 NOTE — Progress Notes (Signed)
  Subjective:    Patient ID: Barbara Flores, female    DOB: Jun 09, 1961, 51 y.o.   MRN: [id number]  HPI ROV As far as her diabetes, her diet has not been the best. High cholesterol, has not taking Crestor in 2 months. Needs a refill. She also developed nausea, vomiting, diarrhea 2 days ago. Husband was sick with similar symptoms.   PMH: Borderline diabetes hyperlipidemia Menopause  allergi rhinitis Stress test neg 07-2010, increased BP noted   Past Surgical History: "eardrum" surgery sinus surgery   Social History: Married, 2 children, 93 and 79 y/o Never Smoked Alcohol use-no Drug use-no Regular exercise-no but very active at work      Review of Systems No fever or chills Stools are described as loose on gray in color but no bloating or blood. She has abdominal cramps throughout. She saw her gynecologist today, dx w/ menopause, to start birth control pills pretty soon.    Objective:   Physical Exam Alert, oriented, in no apparent distress. Not pale or jaundice. Lungs are clear to auscultation bilaterally Cardiovascular regular rate and rhythm without a murmur. Abdomen not distended, soft, slight increased bowel sounds. Extremities without edema.    Assessment & Plan:  Gastroeneteritis: Recent cases of noravirus in the community Recommend conservative RX, see instructions  Patient works on [employer], strongly recommend not to go back to work until better.

## 2011-08-01 NOTE — Assessment & Plan Note (Signed)
Diet exercise discussed. Labs  

## 2011-08-03 ENCOUNTER — Encounter: Payer: Self-pay | Admitting: Internal Medicine

## 2011-08-14 ENCOUNTER — Other Ambulatory Visit: Payer: Self-pay | Admitting: Internal Medicine

## 2011-08-14 DIAGNOSIS — Z1231 Encounter for screening mammogram for malignant neoplasm of breast: Secondary | ICD-10-CM

## 2011-09-09 ENCOUNTER — Ambulatory Visit (HOSPITAL_COMMUNITY)
Admission: RE | Admit: 2011-09-09 | Discharge: 2011-09-09 | Disposition: A | Discharge status: Home / Self Care | Payer: BC Managed Care – PPO | Source: Ambulatory Visit | Attending: Internal Medicine | Admitting: Internal Medicine

## 2011-09-09 DIAGNOSIS — Z1231 Encounter for screening mammogram for malignant neoplasm of breast: Secondary | ICD-10-CM | POA: Insufficient documentation

## 2011-09-26 ENCOUNTER — Telehealth: Payer: Self-pay | Admitting: Internal Medicine

## 2011-09-26 NOTE — Telephone Encounter (Signed)
Patient states her blood pressure has been running about 140/82 and would like to know if she can increase the dosage on her bp medication.

## 2011-09-26 NOTE — Telephone Encounter (Signed)
Pt returned your call.  

## 2011-09-26 NOTE — Telephone Encounter (Signed)
Today BP 142/82, Pt does c/o some dizziness when BP elevated. Pt denies any SOB, chest pain, numbness or tingling headache or blurred vision.

## 2011-09-26 NOTE — Telephone Encounter (Signed)
142/82 may be slightly elevated for her. If she continues with dizziness or her BP continues to be more than 140/80, needs office visit. Otherwise recommend low salt diet, avoid Motrin or Motrin-like meds (if she is taking them).

## 2011-09-26 NOTE — Telephone Encounter (Signed)
Left message to call office

## 2011-09-29 NOTE — Telephone Encounter (Signed)
Pt returned your call.  

## 2011-09-29 NOTE — Telephone Encounter (Signed)
Pt return call,Left message to call office.  

## 2011-09-30 NOTE — Telephone Encounter (Signed)
Discuss with patient  

## 2011-10-06 ENCOUNTER — Other Ambulatory Visit: Payer: Self-pay | Admitting: Internal Medicine

## 2011-10-06 NOTE — Telephone Encounter (Signed)
Refill done.  

## 2011-11-26 ENCOUNTER — Ambulatory Visit (INDEPENDENT_AMBULATORY_CARE_PROVIDER_SITE_OTHER): Payer: BC Managed Care – PPO | Admitting: Internal Medicine

## 2011-11-26 VITALS — BP 122/84 | HR 86 | Temp 98.1°F | Wt 149.0 lb

## 2011-11-26 DIAGNOSIS — J309 Allergic rhinitis, unspecified: Secondary | ICD-10-CM

## 2011-11-26 MED ORDER — FLUTICASONE PROPIONATE 50 MCG/ACT NA SUSP: 2.0000 | Freq: Every day | NASAL | Status: DC

## 2011-11-26 NOTE — Assessment & Plan Note (Signed)
Sx c/w allergies x 2 weeks See instructions

## 2011-11-26 NOTE — Patient Instructions (Addendum)
flonase every day x several weeks OTC claritin 10 mg 1 a day x 3-4 weeks Mucinex twice a day as needed Call if no better in 10 days, call any time if you get worse, severe symptoms, fever

## 2011-11-26 NOTE — Progress Notes (Signed)
  Subjective:    Patient ID: Barbara Flores, female    DOB: 1961-04-21, 51 y.o.   MRN: [id number]  HPI Acute visit 2 weeks history of frontal headache, watery eyes, sinus pressure. Is not taking any medications but has a history of allergies. Symptoms are not associated with nasal discharge.  PMH: Borderline diabetes hyperlipidemia Menopause   allergi rhinitis Stress test neg 07-2010, increased BP noted   Past Surgical History: "eardrum" surgery sinus surgery    Social History: Married, 2 children, 67 and 76 y/o Never Smoked Alcohol use-no Drug use-no Regular exercise-no but very active at work   Review of Systems No fever, chills for the last 2 days. No cough or wheezing No postnasal dripping but she has frequent sneezing and itchy-watery eyes.    Objective:   Physical Exam  General -- alert, well-developed, and overweight appearing. No apparent distress.  HEENT -- TMs normal, throat w/o redness, face symmetric and not tender to palpation, nose not congested  Lungs -- normal respiratory effort, no intercostal retractions, no accessory muscle use, and normal breath sounds.   Heart-- normal rate, regular rhythm, no murmur, and no gallop.   Neurologic-- alert & oriented X3 and strength normal in all extremities. Psych-- Cognition and judgment appear intact. Alert and cooperative with normal attention span and concentration.  not anxious appearing and not depressed appearing.      Assessment & Plan:

## 2011-11-27 ENCOUNTER — Encounter: Payer: Self-pay | Admitting: Internal Medicine

## 2011-12-01 ENCOUNTER — Ambulatory Visit: Payer: BC Managed Care – PPO | Admitting: Internal Medicine

## 2011-12-29 ENCOUNTER — Encounter: Payer: Self-pay | Admitting: Internal Medicine

## 2011-12-29 ENCOUNTER — Ambulatory Visit (INDEPENDENT_AMBULATORY_CARE_PROVIDER_SITE_OTHER): Payer: BC Managed Care – PPO | Admitting: Internal Medicine

## 2011-12-29 VITALS — BP 160/82 | HR 76 | Temp 97.8°F | Wt 154.0 lb

## 2011-12-29 DIAGNOSIS — E119 Type 2 diabetes mellitus without complications: Secondary | ICD-10-CM

## 2011-12-29 DIAGNOSIS — I10 Essential (primary) hypertension: Secondary | ICD-10-CM

## 2011-12-29 DIAGNOSIS — J45909 Unspecified asthma, uncomplicated: Secondary | ICD-10-CM

## 2011-12-29 DIAGNOSIS — E785 Hyperlipidemia, unspecified: Secondary | ICD-10-CM

## 2011-12-29 DIAGNOSIS — Z0000 Encounter for general adult medical examination without abnormal findings: Secondary | ICD-10-CM

## 2011-12-29 DIAGNOSIS — J4520 Mild intermittent asthma, uncomplicated: Secondary | ICD-10-CM | POA: Insufficient documentation

## 2011-12-29 MED ORDER — ALBUTEROL SULFATE HFA 108 (90 BASE) MCG/ACT IN AERS: 2.0000 | INHALATION_SPRAY | Freq: Four times a day (QID) | RESPIRATORY_TRACT | Status: DC | PRN

## 2011-12-29 NOTE — Patient Instructions (Signed)
Albuterol inhaler as needed, if the  "tightness" does not improve in the next few days or if you get worse--->  let me know. Please schedule labs, fasting FLP, CMP, CBC, TSH--- dx v70 Hemoglobin A1c--- dx  diabetes

## 2011-12-29 NOTE — Assessment & Plan Note (Signed)
today for the first time she mentioned she has asthma, in the past she used to see a specialist and used an inhaler. At this point she c/o some chest tightness, she believes due to asthma. No chest pain. I also noted a negstress test last year. Plan: Empiric albuterol. If no better she needs to let me know.

## 2011-12-29 NOTE — Assessment & Plan Note (Addendum)
Td 2012 Never had a colonoscopy, no family history of colon cancer Saw gynecologist 2013, neg MMG 07-2011 ( she has a family history of breast cancer) hot flashes, recommend to discuss with gynecology Diet and exercise discussed

## 2011-12-29 NOTE — Assessment & Plan Note (Signed)
Due for labs

## 2011-12-29 NOTE — Assessment & Plan Note (Addendum)
Currently on Crestor, reports good compliance. Labs

## 2011-12-29 NOTE — Progress Notes (Signed)
  Subjective:    Patient ID: Barbara Flores, female    DOB: 1961/05/06, 51 y.o.   MRN: [id number]  HPI CPX  PMH: Borderline diabetes hyperlipidemia Menopause   allergic rhinitis asthma Stress test neg 07-2010, increased BP noted   Past Surgical History: "eardrum" surgery sinus surgery    Social History: Married, 2 children, 97 and 73 y/o Never Smoked Alcohol use-no Drug use-no Regular exercise-no but very active at work Diet-- not healthy  FH DM-- M CAD-- no Beast ca-- siter Colon ca--no  Review of Systems In general doing well, for the last couple of days she is having some "chest tightness" thinks related to asthma, she however denies specifically cough or wheezing. She also denies chest pain or shortness of breath. Currently she ran out of albuterol  which she takes sporadically. No nausea, vomiting, diarrhea or blood in the stools. No anxiety or depression. She admits to hot flashes, was prescribed HRT per gynecology according to the patient but she couldn't tolerate it.     Objective:   Physical Exam General -- alert, well-developed. No apparent distress.  Neck, no thyromegaly HEENT -- TMs normal, throat w/o redness, face symmetric and not tender to palpation, nose not congested   Lungs -- normal respiratory effort, no intercostal retractions, no accessory muscle use, and normal breath sounds.   Heart-- normal rate, regular rhythm, no murmur, and no gallop.   Abdomen--soft, non-tender, no distention, no masses, no HSM, no guarding, and no rigidity.   Extremities-- no pretibial edema bilaterally  Neurologic-- alert & oriented X3 and strength normal in all extremities. Psych-- Cognition and judgment appear intact. Alert and cooperative with normal attention span and concentration.  not anxious appearing and not depressed appearing.      Assessment & Plan:

## 2011-12-29 NOTE — Assessment & Plan Note (Signed)
BP elevated today, ambulatory BPs usually 140/80, she did not take her medication today. Recommend to check her BP from time to time, if not close to her goal of 120/80, she needs to let me know.

## 2012-01-02 ENCOUNTER — Other Ambulatory Visit (INDEPENDENT_AMBULATORY_CARE_PROVIDER_SITE_OTHER): Payer: BC Managed Care – PPO

## 2012-01-02 DIAGNOSIS — E119 Type 2 diabetes mellitus without complications: Secondary | ICD-10-CM

## 2012-01-02 DIAGNOSIS — Z0000 Encounter for general adult medical examination without abnormal findings: Secondary | ICD-10-CM

## 2012-01-02 LAB — CBC WITH DIFFERENTIAL/PLATELET
Basophils Relative: 0.3 % (ref 0.0–3.0)
Eosinophils Relative: 1.4 % (ref 0.0–5.0)
Lymphocytes Relative: 27.1 % (ref 12.0–46.0)
MCV: 86.6 fl (ref 78.0–100.0)
Monocytes Absolute: 0.5 10*3/uL (ref 0.1–1.0)
Monocytes Relative: 7.1 % (ref 3.0–12.0)
Neutrophils Relative %: 64.1 % (ref 43.0–77.0)
Platelets: 262 10*3/uL (ref 150.0–400.0)
RBC: 4.36 Mil/uL (ref 3.87–5.11)
WBC: 7 10*3/uL (ref 4.5–10.5)

## 2012-01-02 LAB — COMPREHENSIVE METABOLIC PANEL
Albumin: 4.1 g/dL (ref 3.5–5.2)
Alkaline Phosphatase: 57 U/L (ref 39–117)
BUN: 14 mg/dL (ref 6–23)
CO2: 23 mEq/L (ref 19–32)
Calcium: 8.7 mg/dL (ref 8.4–10.5)
GFR: 110.78 mL/min (ref >60.00)
Glucose, Bld: 113 mg/dL — ABNORMAL HIGH (ref 70–99)
Potassium: 4.1 mEq/L (ref 3.5–5.1)
Sodium: 138 mEq/L (ref 135–145)
Total Protein: 7.5 g/dL (ref 6.0–8.3)

## 2012-01-02 LAB — TSH: TSH: 1.61 u[IU]/mL (ref 0.35–5.50)

## 2012-01-02 LAB — LIPID PANEL
Cholesterol: 165 mg/dL (ref 0–200)
VLDL: 12.8 mg/dL (ref 0.0–40.0)

## 2012-01-02 LAB — HEMOGLOBIN A1C: Hgb A1c MFr Bld: 6.1 % (ref 4.6–6.5)

## 2012-01-02 NOTE — Progress Notes (Signed)
Labs only

## 2012-01-05 ENCOUNTER — Encounter: Payer: Self-pay | Admitting: *Deleted

## 2012-02-24 ENCOUNTER — Encounter: Payer: Self-pay | Admitting: *Deleted

## 2012-02-24 ENCOUNTER — Ambulatory Visit (INDEPENDENT_AMBULATORY_CARE_PROVIDER_SITE_OTHER): Payer: BC Managed Care – PPO | Admitting: Internal Medicine

## 2012-02-24 ENCOUNTER — Encounter: Payer: Self-pay | Admitting: Internal Medicine

## 2012-02-24 VITALS — BP 130/80 | HR 80 | Temp 98.1°F | Wt 152.0 lb

## 2012-02-24 DIAGNOSIS — J069 Acute upper respiratory infection, unspecified: Secondary | ICD-10-CM

## 2012-02-24 MED ORDER — AZITHROMYCIN 250 MG PO TABS: ORAL_TABLET | ORAL | Status: AC

## 2012-02-24 NOTE — Patient Instructions (Addendum)
Rest, fluids , tylenol For cough, take Mucinex DM twice a day as needed  For congestion okay to use a low dose of behind the counter Sudafed 30 mg 3 times a day as needed. Take the antibiotic as prescribed , Zithromax, only if you are not feeling better in the next few days. Use albuterol as needed for wheezing Call if no better next week Call anytime if the symptoms are severe

## 2012-02-24 NOTE — Assessment & Plan Note (Signed)
URI, Symptoms consistent with URI, likely viral. Recommend conservative treatment including a low dose of Sudafed since her blood pressure is very well-controlled. If not better she is to start Zithromax, she is allergic to penicillin

## 2012-02-24 NOTE — Progress Notes (Signed)
  Subjective:    Patient ID: Barbara Flores, female    DOB: August 17, 1960, 51 y.o.   MRN: [id number]  HPI Acute visit  sudden onset of sinus pressure, cough, runny nose and frontal headache yesterday. Taking Mucinex.  PMH: Borderline diabetes hyperlipidemia Menopause   allergic rhinitis asthma Stress test neg 07-2010, increased BP noted   Past Surgical History: "eardrum" surgery sinus surgery    Social History: Married, 2 children, 23 and 6 y/o Never Smoked Alcohol use-no Drug use-no Regular exercise-no but very active at work Diet-- not healthy  FH DM-- M CAD-- no Beast ca-- siter Colon ca--no   Review of Systems She has a history of asthma, currently not wheezing, not using  Albuterol in the last few days  Denies shortness of breath No nausea, vomiting or diarrhea     Objective:   Physical Exam  General -- alert, well-developed. No apparent distress.  HEENT -- R TM normal, L TM slt bulge, no red or d/c, throat w/o redness, face symmetric and  tender to palpation at both the maxillary and frontal sinuses, nose quite congested EOMI. Lungs -- normal respiratory effort, no intercostal retractions, no accessory muscle use, and normal breath sounds.   Heart-- normal rate, regular rhythm, no murmur, and no gallop.   Psych-- Cognition and judgment appear intact. Alert and cooperative with normal attention span and concentration.  not anxious appearing and not depressed appearing.       Assessment & Plan:

## 2012-04-09 ENCOUNTER — Other Ambulatory Visit: Payer: Self-pay | Admitting: Internal Medicine

## 2012-04-09 NOTE — Telephone Encounter (Signed)
Refill done.  

## 2012-05-11 ENCOUNTER — Other Ambulatory Visit: Payer: Self-pay | Admitting: Internal Medicine

## 2012-05-11 NOTE — Telephone Encounter (Signed)
Spoke with pharmacy & confirmed pt still has refills left on file. Left msg on pt's vmail notifying her.

## 2012-05-11 NOTE — Telephone Encounter (Signed)
Pt called and scheduled January med check - would not take a December appt- but she needs to have her refills done before that time. She would like you to call her and let her know if she can get refills. I told her that I would pass the message along.

## 2012-05-11 NOTE — Telephone Encounter (Signed)
refill Coreg 6.25MG  tab #30 Take one tablet by mouth every day last fill 9.23.13 last ov 7.15.13 V70

## 2012-06-05 ENCOUNTER — Other Ambulatory Visit: Payer: Self-pay | Admitting: Internal Medicine

## 2012-06-22 ENCOUNTER — Ambulatory Visit: Payer: BC Managed Care – PPO | Admitting: Internal Medicine

## 2012-06-24 LAB — HM MAMMOGRAPHY

## 2012-06-24 LAB — HM PAP SMEAR: HM PAP: NORMAL

## 2012-06-29 ENCOUNTER — Ambulatory Visit (INDEPENDENT_AMBULATORY_CARE_PROVIDER_SITE_OTHER): Payer: BC Managed Care – PPO | Admitting: Internal Medicine

## 2012-06-29 ENCOUNTER — Encounter: Payer: Self-pay | Admitting: Internal Medicine

## 2012-06-29 VITALS — BP 136/84 | HR 77 | Temp 98.1°F | Wt 154.0 lb

## 2012-06-29 DIAGNOSIS — E785 Hyperlipidemia, unspecified: Secondary | ICD-10-CM

## 2012-06-29 DIAGNOSIS — E119 Type 2 diabetes mellitus without complications: Secondary | ICD-10-CM

## 2012-06-29 DIAGNOSIS — I10 Essential (primary) hypertension: Secondary | ICD-10-CM

## 2012-06-29 MED ORDER — ROSUVASTATIN CALCIUM 20 MG PO TABS: 10.0000 mg | ORAL_TABLET | Freq: Every day | ORAL | Status: DC

## 2012-06-29 NOTE — Assessment & Plan Note (Signed)
Good compliance with medication, good BP today

## 2012-06-29 NOTE — Assessment & Plan Note (Signed)
Last FLP satisfactory, continue with Crestor, refill provided

## 2012-06-29 NOTE — Assessment & Plan Note (Signed)
Reports a negative eye exam July 2013. We discussed diet and exercise, read "Diabetes for Dummies"? Plan: Labs

## 2012-06-29 NOTE — Progress Notes (Signed)
  Subjective:    Patient ID: Barbara Flores, female    DOB: 06-23-1960, 52 y.o.   MRN: [id number]  HPI ROV Good medication compliance with medications. Has a history of asthma, has not used Ventolin lately.  Past Medical History  Diagnosis Date  . Allergic rhinitis   . Hyperlipidemia   . History of exercise stress test 07-2010    normal except for elevated BP  . Diabetes mellitus 08/23/2010  . Asthma   . Menopause    Past Surgical History  Procedure Date  . Eardrum surgery   . Nasal sinus surgery    History   Social History  . Marital Status: Married    Spouse Name: N/A    Number of Children: 2  . Years of Education: N/A   Occupational History  . [employer]    Social History Main Topics  . Smoking status: Never Smoker   . Smokeless tobacco: Never Used  . Alcohol Use: Yes     Comment: red wine sometimes   . Drug Use: No  . Sexually Active: Not on file   Other Topics Concern  . Not on file   Social History Narrative   2 children, 22 and 20 y/oRegular exercise- no but very active at work     Review of Systems In general feels well, her diet has not changed much, there is room for improvement. She remains extremity active, has 2 jobs. Still having issues with hot flashes. Ambulatory BPs around 130/80.  Had a negative eye exam July 2013    Objective:   Physical Exam General -- alert, well-developed, and well-nourished.   Lungs -- normal respiratory effort, no intercostal retractions, no accessory muscle use, and normal breath sounds.   Heart-- normal rate, regular rhythm, no murmur, and no gallop.   Extremities-- no pretibial edema bilaterally  Psych-- Cognition and judgment appear intact. Alert and cooperative with normal attention span and concentration.  not anxious appearing and not depressed appearing.        Assessment & Plan:

## 2012-06-29 NOTE — Patient Instructions (Signed)

## 2012-06-30 LAB — HEMOGLOBIN A1C: Hgb A1c MFr Bld: 6 % (ref 4.6–6.5)

## 2012-07-01 ENCOUNTER — Encounter: Payer: Self-pay | Admitting: *Deleted

## 2012-08-09 ENCOUNTER — Ambulatory Visit (INDEPENDENT_AMBULATORY_CARE_PROVIDER_SITE_OTHER): Payer: BC Managed Care – PPO | Admitting: Family Medicine

## 2012-08-09 ENCOUNTER — Encounter: Payer: Self-pay | Admitting: Family Medicine

## 2012-08-09 VITALS — BP 112/68 | HR 77 | Temp 98.2°F | Wt 156.2 lb

## 2012-08-09 DIAGNOSIS — M7710 Lateral epicondylitis, unspecified elbow: Secondary | ICD-10-CM

## 2012-08-09 DIAGNOSIS — M7712 Lateral epicondylitis, left elbow: Secondary | ICD-10-CM

## 2012-08-09 MED ORDER — PREDNISONE 10 MG PO TABS: ORAL_TABLET | ORAL | Status: DC

## 2012-08-09 NOTE — Progress Notes (Signed)
  Subjective:    Barbara Flores is a 52 y.o. female who presents with left elbow pain. Onset of the symptoms was several weeks ago. Inciting event: none known. Current symptoms include: point tenderness laterally and medially. Pain is aggravated by: grasping, lifting heavy objects, lifting trays of food. Symptoms have progressed to a point and plateaued. Patient has had no prior elbow problems. Evaluation to date: none. Treatment to date: OTC analgesics.  The following portions of the patient's history were reviewed and updated as appropriate: allergies, current medications, past family history, past medical history, past social history, past surgical history and problem list.  Review of Systems Pertinent items are noted in HPI.   Objective:    BP 112/68  Pulse 77  Temp(Src) 98.2 F (36.8 C) (Oral)  Wt 156 lb 3.2 oz (70.852 kg)  BMI 31.53 kg/m2  SpO2 77% Right elbow: without deformity  Left elbow:  tenderness over medial epicondyle and tenderness over lateral epicondyle   X-ray right elbow: not indicated   Assessment:    left elbow tendonitis    Plan:    Natural history and expected course discussed. Questions answered. Rest, ice, compression, and elevation (RICE) therapy. Reduction in offending activity. Tennis elbow splint. pred taper and ortho if no better

## 2012-08-09 NOTE — Patient Instructions (Signed)
Tennis Elbow  Your caregiver has diagnosed you with a condition often referred to as "tennis elbow." This results from small tears or soreness (inflammation) at the start (origin) of the extensor muscles of the forearm. Although the condition is often called tennis or golfer's elbow, it is caused by any repetitive action performed by your elbow.  HOME CARE INSTRUCTIONS   If the condition has been short lived, rest may be the only treatment required. Using your opposite hand or arm to perform the task may help. Even changing your grip may help rest the extremity. These may even prevent the condition from recurring.   Longer standing problems, however, will often be relieved faster by:   Using anti-inflammatory agents.   Applying ice packs for 30 minutes at the end of the working day, at bed time, or when activities are finished.   Your caregiver may also have you wear a splint or sling. This will allow the inflamed tendon to heal.  At times, steroid injections aided with a local anesthetic will be required along with splinting for 1 to 2 weeks. Two to three steroid injections will often solve the problem. In some long standing cases, the inflamed tendon does not respond to conservative (non-surgical) therapy. Then surgery may be required to repair it.  MAKE SURE YOU:    Understand these instructions.   Will watch your condition.   Will get help right away if you are not doing well or get worse.  Document Released: 06/02/2005 Document Revised: 08/25/2011 Document Reviewed: 01/19/2008  [employer] Patient Information 2013 [employer].

## 2012-08-19 ENCOUNTER — Telehealth: Payer: Self-pay | Admitting: Internal Medicine

## 2012-08-19 DIAGNOSIS — M7712 Lateral epicondylitis, left elbow: Secondary | ICD-10-CM

## 2012-08-19 NOTE — Telephone Encounter (Signed)
Referral entered  

## 2012-08-19 NOTE — Telephone Encounter (Signed)
Patient called requesting referral for her tennis elbow.

## 2012-08-19 NOTE — Telephone Encounter (Signed)
Please advise 

## 2012-08-19 NOTE — Telephone Encounter (Signed)
To Dr Pearletha Forge, sports med

## 2012-08-25 ENCOUNTER — Ambulatory Visit (INDEPENDENT_AMBULATORY_CARE_PROVIDER_SITE_OTHER): Payer: BC Managed Care – PPO | Admitting: Family Medicine

## 2012-08-25 ENCOUNTER — Other Ambulatory Visit: Payer: Self-pay | Admitting: Internal Medicine

## 2012-08-25 ENCOUNTER — Encounter: Payer: Self-pay | Admitting: Family Medicine

## 2012-08-25 VITALS — BP 172/94 | HR 87 | Ht 59.0 in | Wt 150.0 lb

## 2012-08-25 DIAGNOSIS — M25522 Pain in left elbow: Secondary | ICD-10-CM

## 2012-08-25 DIAGNOSIS — Z1231 Encounter for screening mammogram for malignant neoplasm of breast: Secondary | ICD-10-CM

## 2012-08-25 DIAGNOSIS — IMO0002 Reserved for concepts with insufficient information to code with codable children: Secondary | ICD-10-CM

## 2012-08-25 DIAGNOSIS — S56912A Strain of unspecified muscles, fascia and tendons at forearm level, left arm, initial encounter: Secondary | ICD-10-CM

## 2012-08-25 DIAGNOSIS — M25529 Pain in unspecified elbow: Secondary | ICD-10-CM

## 2012-08-25 MED ORDER — IBUPROFEN 800 MG PO TABS: 800.0000 mg | ORAL_TABLET | Freq: Three times a day (TID) | ORAL | Status: DC | PRN

## 2012-08-25 NOTE — Patient Instructions (Addendum)
You have a forearm strain, not true lateral epicondylitis though the treatment is similar. The only difference is a cortisone shot is unlikely to be helpful for this. Try to avoid painful activities as much as possible. Ice the area 3-4 times a day for 15 minutes at a time. Ibuprofen 800mg  three times a day with food - I've sent this to your [facility]. Counterforce brace as directed can help unload area - wear this regularly if it provides you with relief. Home Pronation/supination with 1 pound weight (or hammer), wrist extension, stretching - do these once a day; exercises 3 sets of 10. Consider formal physical therapy. Consider nitro patches if the above is not helping. Follow up with me in 1 month to 6 weeks for reevaluation.

## 2012-08-26 ENCOUNTER — Encounter: Payer: Self-pay | Admitting: Family Medicine

## 2012-08-26 DIAGNOSIS — M25522 Pain in left elbow: Secondary | ICD-10-CM | POA: Insufficient documentation

## 2012-08-26 NOTE — Assessment & Plan Note (Signed)
Left forearm strain - not a true epicondylitis as she is not tender here though treated similarly except injection is unlikely to be of benefit to her.  Start home exercise program which I demonstrated to her today.  She states she doesn't have time to do formal PT.  Ibuprofen three times a day, counterforce brace, wrist brace to help prevent overuse.  Consider formal PT, nitro patches if not improving.  F/u in 1 month to 6 weeks.

## 2012-08-26 NOTE — Progress Notes (Signed)
Subjective:    Patient ID: Barbara Flores, female    DOB: 1960-09-23, 52 y.o.   MRN: [id number]  PCP: Dr. Drue Novel  HPI 52 yo F here for left elbow pain.  Patient denies known injury. She states elbow and forearm just started hurting on outside about 3 weeks ago. 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 Some slight swelling maybe but no bruising. Is right handed. No prior elbow/arm injuries. Has been icing, taking tylenol, massaging area. Using a counterforce brace with minimal relief. Difficulty gripping things. No numbness/tingling. Pain radiates down forearm.  Past Medical History  Diagnosis Date  . Allergic rhinitis   . Hyperlipidemia   . History of exercise stress test 07-2010    normal except for elevated BP  . Diabetes mellitus 08/23/2010  . Asthma   . Menopause     Current Outpatient Prescriptions on File Prior to Visit  Medication Sig Dispense Refill  . albuterol (VENTOLIN HFA) 108 (90 BASE) MCG/ACT inhaler Inhale 2 puffs into the lungs every 6 (six) hours as needed for wheezing.  1 Inhaler  1  . aspirin 81 MG tablet Take 81 mg by mouth daily.        . carvedilol (COREG) 6.25 MG tablet TAKE ONE TABLET BY MOUTH EVERY DAY  30 tablet  4  . ERRIN 0.35 MG tablet Take 1 tablet by mouth daily.      Marland Kitchen FINACEA 15 % cream       . fluticasone (FLONASE) 50 MCG/ACT nasal spray Place 2 sprays into the nose daily.  16 g  6  . MIRVASO 0.33 % GEL       . rosuvastatin (CRESTOR) 20 MG tablet Take 0.5 tablets (10 mg total) by mouth daily.  30 tablet  6   No current facility-administered medications on file prior to visit.    Past Surgical History  Procedure Laterality Date  . Eardrum surgery    . Nasal sinus surgery      Allergies  Allergen Reactions  . Penicillins     History   Social History  . Marital Status: Married    Spouse Name: Barbara Flores    Number of Children: 2  . Years of Education: Barbara Flores   Occupational History  . [employer]    Social History  Main Topics  . Smoking status: Never Smoker   . Smokeless tobacco: Never Used  . Alcohol Use: Yes     Comment: red wine sometimes   . Drug Use: No  . Sexually Active: Not on file   Other Topics Concern  . Not on file   Social History Narrative   2 children, 62 and 38 y/o   Regular exercise- no but very active at work           Family History  Problem Relation Age of Onset  . Diabetes Mother   . Hyperlipidemia Mother   . Hypertension Mother   . Stroke Mother   . Heart attack Neg Hx   . Breast cancer Sister 61  . Stomach cancer Father     BP 172/94  Pulse 87  Ht 4\' 11"  (1.499 m)  Wt 150 lb (68.04 kg)  BMI 30.28 kg/m2  Review of Systems See HPI above.    Objective:   Physical Exam Gen: NAD  L elbow: No gross deformity, swelling, bruising. TTP extensor mass of forearm.  No tenderness at lateral epicondyle, elsewhere about elbow or forearm. Collateral ligaments intact.  Pain reproduced with wrist extension.  No pain with 3rd finger extension, supination, elbow motions (flexion/extension). Strength 5/5 with all these motions. NVI distally.    Assessment & Plan:  1. Left forearm strain - not a true epicondylitis as she is not tender here though treated similarly except injection is unlikely to be of benefit to her.  Start home exercise program which I demonstrated to her today.  She states she doesn't have time to do formal PT.  Ibuprofen three times a day, counterforce brace, wrist brace to help prevent overuse.  Consider formal PT, nitro patches if not improving.  F/u in 1 month to 6 weeks.

## 2012-09-08 ENCOUNTER — Ambulatory Visit: Payer: BC Managed Care – PPO | Admitting: Family Medicine

## 2012-09-09 ENCOUNTER — Ambulatory Visit (HOSPITAL_COMMUNITY)
Admission: RE | Admit: 2012-09-09 | Discharge: 2012-09-09 | Disposition: A | Discharge status: Home / Self Care | Payer: BC Managed Care – PPO | Source: Ambulatory Visit | Attending: Internal Medicine | Admitting: Internal Medicine

## 2012-09-09 DIAGNOSIS — Z1231 Encounter for screening mammogram for malignant neoplasm of breast: Secondary | ICD-10-CM | POA: Insufficient documentation

## 2012-09-29 ENCOUNTER — Encounter: Payer: Self-pay | Admitting: Family Medicine

## 2012-09-29 ENCOUNTER — Ambulatory Visit (INDEPENDENT_AMBULATORY_CARE_PROVIDER_SITE_OTHER): Payer: BC Managed Care – PPO | Admitting: Family Medicine

## 2012-09-29 VITALS — BP 178/104 | HR 81 | Ht 59.0 in | Wt 155.0 lb

## 2012-09-29 DIAGNOSIS — M25529 Pain in unspecified elbow: Secondary | ICD-10-CM

## 2012-09-29 DIAGNOSIS — M25522 Pain in left elbow: Secondary | ICD-10-CM

## 2012-09-29 NOTE — Patient Instructions (Addendum)
Your pain has now localized to the lateral epicondyle, consistent with lateral epicondylitis. Try to avoid painful activities as much as possible. Ice the area 3-4 times a day for 15 minutes at a time. Ibuprofen 800mg  three times a day with food. Counterforce brace as directed can help unload area - wear this regularly if it provides you with relief. Home Pronation/supination with 1 pound weight (or hammer), wrist extension, stretching - do these once a day; exercises 3 sets of 10. You were given a cortisone shot today. If you want to try the patches, call me and we can send this to your pharmacy. Follow up with me in 1 month to 6 weeks for reevaluation. Light duty until Monday because you were given a shot today.

## 2012-09-30 ENCOUNTER — Encounter: Payer: Self-pay | Admitting: Family Medicine

## 2012-09-30 NOTE — Progress Notes (Signed)
Subjective:    Patient ID: Barbara Flores, female    DOB: 1960-12-31, 52 y.o.   MRN: [id number]  PCP: Dr. Drue Novel  HPI  52 yo F here for f/u left elbow pain.  3/12: Patient denies known injury. She states elbow and forearm just started hurting on outside about 3 weeks ago. 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 Some slight swelling maybe but no bruising. Is right handed. No prior elbow/arm injuries. Has been icing, taking tylenol, massaging area. Using a counterforce brace with minimal relief. Difficulty gripping things. No numbness/tingling. Pain radiates down forearm.  4/16: Patient reports pain has worsened instead of improved since last visit. Using counterforce brace. Doing home exercises. Taking ibuprofen regularly. Difficulty lifting anything. Pain has localized more at elbow bone laterally now.  Past Medical History  Diagnosis Date  . Allergic rhinitis   . Hyperlipidemia   . History of exercise stress test 07-2010    normal except for elevated BP  . Diabetes mellitus 08/23/2010  . Asthma   . Menopause     Current Outpatient Prescriptions on File Prior to Visit  Medication Sig Dispense Refill  . albuterol (VENTOLIN HFA) 108 (90 BASE) MCG/ACT inhaler Inhale 2 puffs into the lungs every 6 (six) hours as needed for wheezing.  1 Inhaler  1  . aspirin 81 MG tablet Take 81 mg by mouth daily.        . carvedilol (COREG) 6.25 MG tablet TAKE ONE TABLET BY MOUTH EVERY DAY  30 tablet  4  . ERRIN 0.35 MG tablet Take 1 tablet by mouth daily.      Marland Kitchen FINACEA 15 % cream       . fluticasone (FLONASE) 50 MCG/ACT nasal spray Place 2 sprays into the nose daily.  16 g  6  . ibuprofen (ADVIL,MOTRIN) 800 MG tablet Take 1 tablet (800 mg total) by mouth every 8 (eight) hours as needed for pain.  90 tablet  1  . MIRVASO 0.33 % GEL       . rosuvastatin (CRESTOR) 20 MG tablet Take 0.5 tablets (10 mg total) by mouth daily.  30 tablet  6   No current  facility-administered medications on file prior to visit.    Past Surgical History  Procedure Laterality Date  . Eardrum surgery    . Nasal sinus surgery      Allergies  Allergen Reactions  . Penicillins     History   Social History  . Marital Status: Married    Spouse Name: N/A    Number of Children: 2  . Years of Education: N/A   Occupational History  . [employer]    Social History Main Topics  . Smoking status: Never Smoker   . Smokeless tobacco: Never Used  . Alcohol Use: Yes     Comment: red wine sometimes   . Drug Use: No  . Sexually Active: Not on file   Other Topics Concern  . Not on file   Social History Narrative   2 children, 65 and 42 y/o   Regular exercise- no but very active at work           Family History  Problem Relation Age of Onset  . Diabetes Mother   . Hyperlipidemia Mother   . Hypertension Mother   . Stroke Mother   . Heart attack Neg Hx   . Breast cancer Sister 43  . Stomach cancer Father  BP 178/104  Pulse 81  Ht 4\' 11"  (1.499 m)  Wt 155 lb (70.308 kg)  BMI 31.29 kg/m2  LMP 09/01/2012  Review of Systems  See HPI above.    Objective:   Physical Exam  Gen: NAD  L elbow: No gross deformity, swelling, bruising. TTP lateral epicondyle focally.  Less TTP extensor mass of forearm.  No tenderness elsewhere about elbow or forearm. Collateral ligaments intact. Pain reproduced with wrist extension, less with 3rd finger extension.  No pain supination, elbow motions (flexion/extension). Strength 5/5 with all these motions. NVI distally.    Assessment & Plan:  1. Left lateral epicondylitis - has now localized to epicondyle consistent with epicondylitis.  Discussed options (PT, nitro, injection).  She is limited regarding doing PT because of working two jobs, not much free time.  She would like to try injection.  If not improving over next couple weeks advised to call me and we can send in nitro patches.  Reinforced doing home  exercises daily.  Will place on light duty until Monday.  After informed written consent patient was seated on exam table.  Left elbow prepped with alcohol swab just distal to lateral epicondyle in maximal area of pain and injected with 2:1 marcaine: depomedrol with several needle fenestrations.  Patient tolerated procedure well without immediate complications.

## 2012-09-30 NOTE — Assessment & Plan Note (Signed)
Left lateral epicondylitis - has now localized to epicondyle consistent with epicondylitis.  Discussed options (PT, nitro, injection).  She is limited regarding doing PT because of working two jobs, not much free time.  She would like to try injection.  If not improving over next couple weeks advised to call me and we can send in nitro patches.  Reinforced doing home exercises daily.  Will place on light duty until Monday.  After informed written consent patient was seated on exam table.  Left elbow prepped with alcohol swab just distal to lateral epicondyle in maximal area of pain and injected with 2:1 marcaine: depomedrol with several needle fenestrations.  Patient tolerated procedure well without immediate complications.

## 2012-11-01 ENCOUNTER — Ambulatory Visit (INDEPENDENT_AMBULATORY_CARE_PROVIDER_SITE_OTHER): Payer: BC Managed Care – PPO | Admitting: Family Medicine

## 2012-11-01 ENCOUNTER — Encounter: Payer: Self-pay | Admitting: Family Medicine

## 2012-11-01 VITALS — BP 162/89 | HR 85 | Ht 59.0 in | Wt 155.0 lb

## 2012-11-01 DIAGNOSIS — M25529 Pain in unspecified elbow: Secondary | ICD-10-CM

## 2012-11-01 DIAGNOSIS — M25522 Pain in left elbow: Secondary | ICD-10-CM

## 2012-11-02 ENCOUNTER — Encounter: Payer: Self-pay | Admitting: Family Medicine

## 2012-11-02 NOTE — Patient Instructions (Addendum)
Follow up as needed

## 2012-11-02 NOTE — Assessment & Plan Note (Signed)
Left lateral epicondylitis - about 90% improved with injection.  Encouraged to continue with home exercise program until pain is completely gone.  F/u as needed at this point.  Consider repeat injection, PT, nitro, if she worsens instead of completely improves.

## 2012-11-02 NOTE — Progress Notes (Signed)
Subjective:    Patient ID: Barbara Flores, female    DOB: 04-17-1961, 52 y.o.   MRN: [id number]  PCP: Dr. Drue Novel  HPI  52 yo F here for f/u left elbow pain.  3/12: Patient denies known injury. She states elbow and forearm just started hurting on outside about 3 weeks ago. 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 Some slight swelling maybe but no bruising. Is right handed. No prior elbow/arm injuries. Has been icing, taking tylenol, massaging area. Using a counterforce brace with minimal relief. Difficulty gripping things. No numbness/tingling. Pain radiates down forearm.  4/16: Patient reports pain has worsened instead of improved since last visit. Using counterforce brace. Doing home exercises. Taking ibuprofen regularly. Difficulty lifting anything. Pain has localized more at elbow bone laterally now.  5/19: Patient reports she is nearly completely improved. Injection helped a lot. Still doing home exercises. Wears counterforce brace as needed. Ibuprofen occasionally when she has some pain. Is back to full duty.  Past Medical History  Diagnosis Date  . Allergic rhinitis   . Hyperlipidemia   . History of exercise stress test 07-2010    normal except for elevated BP  . Diabetes mellitus 08/23/2010  . Asthma   . Menopause     Current Outpatient Prescriptions on File Prior to Visit  Medication Sig Dispense Refill  . albuterol (VENTOLIN HFA) 108 (90 BASE) MCG/ACT inhaler Inhale 2 puffs into the lungs every 6 (six) hours as needed for wheezing.  1 Inhaler  1  . aspirin 81 MG tablet Take 81 mg by mouth daily.        . carvedilol (COREG) 6.25 MG tablet TAKE ONE TABLET BY MOUTH EVERY DAY  30 tablet  4  . ERRIN 0.35 MG tablet Take 1 tablet by mouth daily.      Marland Kitchen FINACEA 15 % cream       . fluticasone (FLONASE) 50 MCG/ACT nasal spray Place 2 sprays into the nose daily.  16 g  6  . ibuprofen (ADVIL,MOTRIN) 800 MG tablet Take 1 tablet (800 mg total)  by mouth every 8 (eight) hours as needed for pain.  90 tablet  1  . MIRVASO 0.33 % GEL       . rosuvastatin (CRESTOR) 20 MG tablet Take 0.5 tablets (10 mg total) by mouth daily.  30 tablet  6   No current facility-administered medications on file prior to visit.    Past Surgical History  Procedure Laterality Date  . Eardrum surgery    . Nasal sinus surgery      Allergies  Allergen Reactions  . Penicillins     History   Social History  . Marital Status: Married    Spouse Name: N/A    Number of Children: 2  . Years of Education: N/A   Occupational History  . [employer]    Social History Main Topics  . Smoking status: Never Smoker   . Smokeless tobacco: Never Used  . Alcohol Use: Yes     Comment: red wine sometimes   . Drug Use: No  . Sexually Active: Not on file   Other Topics Concern  . Not on file   Social History Narrative   2 children, 58 and 37 y/o   Regular exercise- no but very active at work           Family History  Problem Relation Age of Onset  . Diabetes Mother   . Hyperlipidemia  Mother   . Hypertension Mother   . Stroke Mother   . Heart attack Neg Hx   . Breast cancer Sister 16  . Stomach cancer Father     BP 162/89  Pulse 85  Ht 4\' 11"  (1.499 m)  Wt 155 lb (70.308 kg)  BMI 31.29 kg/m2  Review of Systems  See HPI above.    Objective:   Physical Exam  Gen: NAD  L elbow: No gross deformity, swelling, bruising. No longer with TTP lateral epicondyle or extensor mass of forearm.No tenderness elsewhere about elbow or forearm. No pain with resisted wrist extension, 3rd finger extension.  No pain supination, elbow motions (flexion/extension). Strength 5/5 with all these motions. NVI distally.    Assessment & Plan:  1. Left lateral epicondylitis - about 90% improved with injection.  Encouraged to continue with home exercise program until pain is completely gone.  F/u as needed at this point.  Consider repeat injection, PT, nitro, if she  worsens instead of completely improves.

## 2012-11-04 ENCOUNTER — Telehealth: Payer: Self-pay | Admitting: Internal Medicine

## 2012-11-04 NOTE — Telephone Encounter (Signed)
Patient Information:  Caller Name: Marcelle Overlie  Phone: [phone]  Patient: Barbara Flores, Barbara Flores  Gender: Female  DOB: 11-22-1960  Age: 52 Years  PCP: Willow Ora  Pregnant: No  Office Follow Up:  Does the office need to follow up with this patient?: Yes  Instructions For The Office: Regarding blood pressure med and side effects.  Thank You.  RN Note:  Appt declined.  Caller requests Provider to review and will come in if Provider requests.  Ok to leave a VM per caller.    Symptoms  Reason For Call & Symptoms: Increased in blood pressure on 5/21 with dizziness.   Pt had HBP readings for 184/95, 174/95, and 150/95, yesterday (5/21).  Blood pressure, today 5/22 is150/95.  Intermittent lightheadedness.  Caller wants to know if her blood pressure medications needs adjusted.   Reviewed Health History In EMR: Yes  Reviewed Medications In EMR: Yes  Reviewed Allergies In EMR: Yes  Reviewed Surgeries / Procedures: Yes  Date of Onset of Symptoms: 11/03/2012 OB / GYN:  LMP: 10/13/2012  Guideline(s) Used:  High Blood Pressure  Disposition Per Guideline:   Discuss with PCP and Callback by Nurse Today  Reason For Disposition Reached:   Taking BP medications and feels is having side effects (e.g., impotence, cough, dizziness)  Advice Given:  Call Back If:  Headache, blurred vision, difficulty talking, or difficulty walking occurs  Chest pain or difficulty breathing occurs  You want to go in to the office for a blood pressure check  You become worse.  Patient Will Follow Care Advice:  YES

## 2012-11-04 NOTE — Telephone Encounter (Signed)
Pt declined appt. Please advise.   

## 2012-11-04 NOTE — Telephone Encounter (Signed)
BP is elevated, if she has any headaches, chest pain, shortness of breath or leg edema -- needs to go to the ER. Otherwise needs office visit to a BP check . In the meantime, if she is taking motrin-advil or similar medications OTC, needs to avoid them  as they may increase her BP.

## 2012-11-04 NOTE — Telephone Encounter (Signed)
Discussed with pt, schedule appt for 5.23.14 @ 2pm.

## 2012-11-05 ENCOUNTER — Encounter: Payer: Self-pay | Admitting: Internal Medicine

## 2012-11-05 ENCOUNTER — Ambulatory Visit (INDEPENDENT_AMBULATORY_CARE_PROVIDER_SITE_OTHER): Payer: BC Managed Care – PPO | Admitting: Internal Medicine

## 2012-11-05 VITALS — BP 162/94 | HR 87 | Temp 98.2°F | Wt 155.0 lb

## 2012-11-05 DIAGNOSIS — I10 Essential (primary) hypertension: Secondary | ICD-10-CM

## 2012-11-05 MED ORDER — LOSARTAN POTASSIUM-HCTZ 50-12.5 MG PO TABS: 1.0000 | ORAL_TABLET | Freq: Every day | ORAL | Status: DC

## 2012-11-05 MED ORDER — FLUTICASONE PROPIONATE 50 MCG/ACT NA SUSP: 2.0000 | Freq: Every day | NASAL | Status: DC

## 2012-11-05 NOTE — Assessment & Plan Note (Addendum)
BPs elevated for the last 2 months, etiology unclear. May be related to sporadic Motrin intake. Plan: D/c coreg (she is on a low dose) start losartan HCT BMP in 2 weeks Continue with low salt diet Discontinued tumeric  although is unclear if that is causing any problems.

## 2012-11-05 NOTE — Patient Instructions (Addendum)
Stop coreg, tumeric  and motrin Tylenol  500 mg OTC 2 tabs a day every 8 hours as needed for pain Start losartan HCT, take one every morning Continue checking your BPs daily, call if BPs are not gradually going down to around 130/80. Schedule labs in 2 weeks (BMP --dx hypertension) Please come back in 2 months for your routine checkup.

## 2012-11-05 NOTE — Progress Notes (Signed)
  Subjective:    Patient ID: Barbara Flores, female    DOB: [birth year], 52 y.o.   MRN: [id number]  HPI Acute visit for hypertension management. The patient stats her BP was well-controlled up until last week, in the last few days it has been as high as in the 180s. On chart review however, her BP has been elevated for the last 2 months. In general she feels well. For the last few months she has been doing a low-salt diet. She has been prescribed Motrin 800 mg at night, she takes no more than one or 2 tablets a week. On BCP for at least 6 months. Stress is not increased from baseline. Good compliance with Coreg and the rest of the medications.  Past Medical History  Diagnosis Date  . Allergic rhinitis   . Hyperlipidemia   . History of exercise stress test 07-2010    normal except for elevated BP  . Diabetes mellitus 08/23/2010  . Asthma   . Menopause    Past Surgical History  Procedure Laterality Date  . Eardrum surgery    . Nasal sinus surgery     History   Social History  . Marital Status: Married    Spouse Name: N/A    Number of Children: 2  . Years of Education: N/A   Occupational History  . [employer]    Social History Main Topics  . Smoking status: Never Smoker   . Smokeless tobacco: Never Used  . Alcohol Use: Yes     Comment: red wine sometimes   . Drug Use: No  . Sexually Active: Not on file   Other Topics Concern  . Not on file   Social History Narrative   2 children, 59 and 49 y/o   Regular exercise- no but very active at work           Review of Systems No chest pain or shortness of breath. No lower extremity edema. No nosebleeds. Has elbow pain, sees the sports medicine doctor, had a local injection 4 weeks ago.  Denies taking any herbs or over-the-counter except for tumeric for the last week.    Objective:   Physical Exam BP 162/94  Pulse 87  Temp(Src) 98.2 F (36.8 C) (Oral)  Wt 155 lb (70.308 kg)  BMI 31.29 kg/m2  SpO2 98%  General -- alert,  well-developed, NAD Neck --no thyromegaly  Lungs -- normal respiratory effort, no intercostal retractions, no accessory muscle use, and normal breath sounds.   Heart-- normal rate, regular rhythm, no murmur, and no gallop.   Abdomen--soft, non-tender, no bruit Extremities-- no pretibial edema bilaterally  Psych-- Cognition and judgment appear intact. Alert and cooperative with normal attention span and concentration.  not anxious appearing and not depressed appearing.       Assessment & Plan:

## 2012-11-17 ENCOUNTER — Other Ambulatory Visit: Payer: Self-pay | Admitting: Internal Medicine

## 2012-11-17 DIAGNOSIS — I10 Essential (primary) hypertension: Secondary | ICD-10-CM

## 2012-11-19 ENCOUNTER — Other Ambulatory Visit (INDEPENDENT_AMBULATORY_CARE_PROVIDER_SITE_OTHER): Payer: BC Managed Care – PPO

## 2012-11-19 DIAGNOSIS — I10 Essential (primary) hypertension: Secondary | ICD-10-CM

## 2012-11-19 LAB — BASIC METABOLIC PANEL
BUN: 14 mg/dL (ref 6–23)
Chloride: 106 mEq/L (ref 96–112)
GFR: 117.91 mL/min (ref >60.00)
Potassium: 3.9 mEq/L (ref 3.5–5.1)
Sodium: 137 mEq/L (ref 135–145)

## 2012-11-24 ENCOUNTER — Encounter: Payer: Self-pay | Admitting: *Deleted

## 2012-12-29 ENCOUNTER — Encounter: Payer: BC Managed Care – PPO | Admitting: Internal Medicine

## 2013-01-27 ENCOUNTER — Ambulatory Visit (INDEPENDENT_AMBULATORY_CARE_PROVIDER_SITE_OTHER): Payer: BC Managed Care – PPO | Admitting: Internal Medicine

## 2013-01-27 ENCOUNTER — Encounter: Payer: Self-pay | Admitting: *Deleted

## 2013-01-27 ENCOUNTER — Encounter: Payer: Self-pay | Admitting: Internal Medicine

## 2013-01-27 VITALS — BP 148/80 | HR 81 | Temp 98.2°F | Ht 60.5 in | Wt 156.4 lb

## 2013-01-27 DIAGNOSIS — I10 Essential (primary) hypertension: Secondary | ICD-10-CM

## 2013-01-27 DIAGNOSIS — Z0000 Encounter for general adult medical examination without abnormal findings: Secondary | ICD-10-CM

## 2013-01-27 DIAGNOSIS — E785 Hyperlipidemia, unspecified: Secondary | ICD-10-CM

## 2013-01-27 DIAGNOSIS — E119 Type 2 diabetes mellitus without complications: Secondary | ICD-10-CM

## 2013-01-27 LAB — CBC WITH DIFFERENTIAL/PLATELET
Basophils Relative: 0.5 % (ref 0.0–3.0)
Eosinophils Absolute: 0.1 10*3/uL (ref 0.0–0.7)
HCT: 40.5 % (ref 36.0–46.0)
Hemoglobin: 13.7 g/dL (ref 12.0–15.0)
Lymphocytes Relative: 31.5 % (ref 12.0–46.0)
Lymphs Abs: 2.6 10*3/uL (ref 0.7–4.0)
MCHC: 33.8 g/dL (ref 30.0–36.0)
Monocytes Relative: 7.4 % (ref 3.0–12.0)
Neutro Abs: 4.8 10*3/uL (ref 1.4–7.7)
RBC: 4.52 Mil/uL (ref 3.87–5.11)

## 2013-01-27 LAB — LIPID PANEL
Cholesterol: 161 mg/dL (ref 0–200)
LDL Cholesterol: 91 mg/dL (ref 0–99)
Triglycerides: 119 mg/dL (ref 0.0–149.0)

## 2013-01-27 LAB — ALT: ALT: 18 U/L (ref 0–35)

## 2013-01-27 LAB — AST: AST: 21 U/L (ref 0–37)

## 2013-01-27 NOTE — Assessment & Plan Note (Addendum)
Good compliance and tolerance w/ losratan HCT, post losartan BMP ok amb BP yesterday good  Plan-- monitor BPs more often, see instructions

## 2013-01-27 NOTE — Assessment & Plan Note (Signed)
Labs

## 2013-01-27 NOTE — Progress Notes (Signed)
  Subjective:    Patient ID: Barbara Flores, female    DOB: [birth year], 52 y.o.   MRN: [id number]  HPI CPX  Past Medical History  Diagnosis Date  . Allergic rhinitis   . Hyperlipidemia   . History of exercise stress test 07-2010    normal except for elevated BP  . Diabetes mellitus 08/23/2010  . Asthma   . Menopause    Past Surgical History  Procedure Laterality Date  . Eardrum surgery    . Nasal sinus surgery     History   Social History  . Marital Status: Married    Spouse Name: N/A    Number of Children: 2  . Years of Education: N/A   Occupational History  . [employer]    Social History Main Topics  . Smoking status: Never Smoker   . Smokeless tobacco: Never Used  . Alcohol Use: Yes     Comment: red wine sometimes   . Drug Use: No  . Sexual Activity: Not on file   Other Topics Concern  . Not on file   Social History Narrative   2 children, 73 and 2 y/o   Regular exercise- no but very active at work          Family History  Problem Relation Age of Onset  . Diabetes Mother   . Hyperlipidemia Mother   . Hypertension Mother   . Stroke Mother   . Heart attack Neg Hx   . Breast cancer Sister 50  . Stomach cancer Father   . Colon cancer Neg Hx       Review of Systems Diet--trying to watch Exercise--has 2 jobs, very active all the time, not routine exercise per se. No chest pain or shortness or breath No  nausea, vomiting, abdominal pain or blood in the stools. No dysuria gross hematuria. No Anxiety depression.     Objective:   Physical Exam BP 148/80  Pulse 81  Temp(Src) 98.2 F (36.8 C) (Oral)  Ht 5' 0.5" (1.537 m)  Wt 156 lb 6.4 oz (70.943 kg)  BMI 30.03 kg/m2  SpO2 98%\ General -- alert, well-developed, NAD Neck --no thyromegaly Lungs -- normal respiratory effort, no intercostal retractions, no accessory muscle use, and normal breath sounds.   Heart-- normal rate, regular rhythm, no murmur, and no gallop.   Abdomen--soft, non-tender, no  distention, no masses  Extremities-- no pretibial edema bilaterally Neurologic-- alert & oriented X3 and strength normal in all extremities. Psych-- Cognition and judgment appear intact. Alert and cooperative with normal attention span and concentration.  not anxious appearing and not depressed appearing.       Assessment & Plan:

## 2013-01-27 NOTE — Assessment & Plan Note (Addendum)
Td 2012 Never had a colonoscopy, no family history of colon cancer Saw gynecologist 2014, neg MMG 07-2012 ( she has a family history of breast cancer) labs Diet and exercise discussed

## 2013-01-27 NOTE — Assessment & Plan Note (Signed)
A1c concept, goals  discussed, labs

## 2013-01-27 NOTE — Patient Instructions (Addendum)
Check the  blood pressure 2 or 3 times a week, be sure it is between 110/60 and 140/85. If it is consistently higher or lower, let me know Next visit in 4 months, please make an appointment , no fasting

## 2013-01-31 ENCOUNTER — Encounter: Payer: Self-pay | Admitting: *Deleted

## 2013-02-23 ENCOUNTER — Other Ambulatory Visit: Payer: Self-pay | Admitting: Internal Medicine

## 2013-02-23 NOTE — Telephone Encounter (Signed)
rx refilled per protocol. DJR  

## 2013-05-30 ENCOUNTER — Ambulatory Visit: Payer: BC Managed Care – PPO | Admitting: Internal Medicine

## 2013-06-24 ENCOUNTER — Ambulatory Visit (INDEPENDENT_AMBULATORY_CARE_PROVIDER_SITE_OTHER): Payer: Private Health Insurance - Indemnity | Admitting: Internal Medicine

## 2013-06-24 ENCOUNTER — Encounter: Payer: Self-pay | Admitting: Internal Medicine

## 2013-06-24 VITALS — BP 122/78 | HR 75 | Temp 97.6°F | Wt 152.0 lb

## 2013-06-24 DIAGNOSIS — E119 Type 2 diabetes mellitus without complications: Secondary | ICD-10-CM

## 2013-06-24 DIAGNOSIS — I10 Essential (primary) hypertension: Secondary | ICD-10-CM

## 2013-06-24 DIAGNOSIS — J45909 Unspecified asthma, uncomplicated: Secondary | ICD-10-CM

## 2013-06-24 DIAGNOSIS — E785 Hyperlipidemia, unspecified: Secondary | ICD-10-CM

## 2013-06-24 NOTE — Assessment & Plan Note (Signed)
Good compliance of medication, BP today very good, seems to be doing very well, check a BMP

## 2013-06-24 NOTE — Assessment & Plan Note (Signed)
On diet control only, Check the A1c

## 2013-06-24 NOTE — Progress Notes (Signed)
Pre visit review using our clinic review tool, if applicable. No additional management support is needed unless otherwise documented below in the visit note. 

## 2013-06-24 NOTE — Assessment & Plan Note (Signed)
Had a flu shot already, uses albuterol very rarely. Doing well.

## 2013-06-24 NOTE — Assessment & Plan Note (Signed)
Good compliance with Crestor, last flp ok

## 2013-06-24 NOTE — Progress Notes (Signed)
   Subjective:    Patient ID: Barbara Flores, female    DOB: [birth year], 54 y.o.   MRN: [id number]  HPI Routine office visit Hypertension--good medication compliance, ambulatory BPs normal per patient, no readings High cholesterol--on Crestor, good compliance, no apparent side effects. Asthma--hardly ever uses her inhaler Diabetes--doing very well with exercise, diet usually okay except in the last 2 weeks. No ambulatory CBGs  Past Medical History  Diagnosis Date  . Allergic rhinitis   . Hyperlipidemia   . History of exercise stress test 07-2010    normal except for elevated BP  . Diabetes mellitus 08/23/2010  . Asthma   . Menopause   . Rosacea    Past Surgical History  Procedure Laterality Date  . Eardrum surgery    . Nasal sinus surgery     Review of Systems Denies chest pain or shortness or breath No nausea, vomiting, diarrhea    Objective:   Physical Exam BP 122/78  Pulse 75  Temp(Src) 97.6 F (36.4 C)  Wt 152 lb (68.947 kg)  SpO2 98% General -- alert, well-developed, NAD.   Lungs -- normal respiratory effort, no intercostal retractions, no accessory muscle use, and normal breath sounds.  Heart-- normal rate, regular rhythm, no murmur.   Extremities-- no pretibial edema bilaterally  Neurologic--  alert & oriented X3. Speech normal, gait normal, strength normal in all extremities.   Psych-- Cognition and judgment appear intact. Cooperative with normal attention span and concentration. No anxious or depressed appearing.      Assessment & Plan:

## 2013-06-24 NOTE — Patient Instructions (Signed)
Get your blood work before you leave   Next visit is for a physical exam in 7 months  , fasting Please make an appointment    Check the  blood pressure  Monthly be sure it is between 110/60 and 140/85. Ideal blood pressure is 120/80. If it is consistently higher or lower, let me know

## 2013-06-25 LAB — BASIC METABOLIC PANEL
BUN: 12 mg/dL (ref 6–23)
CALCIUM: 9.9 mg/dL (ref 8.4–10.5)
CO2: 25 mEq/L (ref 19–32)
CREATININE: 0.65 mg/dL (ref 0.50–1.10)
Chloride: 101 mEq/L (ref 96–112)
GLUCOSE: 83 mg/dL (ref 70–99)
Potassium: 4 mEq/L (ref 3.5–5.3)
SODIUM: 135 meq/L (ref 135–145)

## 2013-06-25 LAB — HEMOGLOBIN A1C
Hgb A1c MFr Bld: 6.2 % — ABNORMAL HIGH (ref <5.7)
Mean Plasma Glucose: 131 mg/dL — ABNORMAL HIGH (ref <117)

## 2013-06-26 ENCOUNTER — Other Ambulatory Visit: Payer: Self-pay | Admitting: Internal Medicine

## 2013-06-26 ENCOUNTER — Encounter: Payer: Self-pay | Admitting: Internal Medicine

## 2013-06-27 ENCOUNTER — Encounter: Payer: Self-pay | Admitting: *Deleted

## 2013-06-30 ENCOUNTER — Telehealth: Payer: Self-pay

## 2013-06-30 NOTE — Telephone Encounter (Signed)
Relevant patient education assigned to patient using Emmi. ° °

## 2013-08-01 ENCOUNTER — Encounter: Payer: Self-pay | Admitting: Cardiology

## 2013-08-16 ENCOUNTER — Ambulatory Visit (INDEPENDENT_AMBULATORY_CARE_PROVIDER_SITE_OTHER): Payer: Private Health Insurance - Indemnity | Admitting: Internal Medicine

## 2013-08-16 ENCOUNTER — Encounter: Payer: Self-pay | Admitting: Internal Medicine

## 2013-08-16 VITALS — BP 147/84 | HR 86 | Temp 98.1°F | Wt 153.0 lb

## 2013-08-16 DIAGNOSIS — J069 Acute upper respiratory infection, unspecified: Secondary | ICD-10-CM

## 2013-08-16 MED ORDER — SULFAMETHOXAZOLE-TMP DS 800-160 MG PO TABS: 1.0000 | ORAL_TABLET | Freq: Two times a day (BID) | ORAL | Status: DC

## 2013-08-16 NOTE — Progress Notes (Signed)
Pre visit review using our clinic review tool, if applicable. No additional management support is needed unless otherwise documented below in the visit note. 

## 2013-08-16 NOTE — Patient Instructions (Signed)
Rest, fluids , tylenol For cough, take Mucinex DM twice a day as needed  For congestion use OTC Nasocort: 2 nasal sprays on each side of the nose daily until you feel better  Take the antibiotic as prescribed  (Bactrim DS ) if not improving in the next 3 days Call anytime if the symptoms are persistent or severe

## 2013-08-16 NOTE — Progress Notes (Signed)
Subjective:    Patient ID: Barbara Flores, female    DOB: 19-May-1961, 53 y.o.   MRN: [id number]  DOS:  08/16/2013 Type of  visit: Acute visit  Symptoms started 3 days ago with pressure behind the eyes, "feels like I have sinusitis". Asthma is okay, not needing albuterol frequently. Taking different OTCs for cough without much help   ROS Mild subjective fever and chills. Dry cough, dry. + Runny nose, mild ST, occasional sneezing. Denies chest congestion   Past Medical History  Diagnosis Date  . Allergic rhinitis   . Hyperlipidemia   . History of exercise stress test 07-2010    normal except for elevated BP  . Diabetes mellitus 08/23/2010  . Asthma   . Menopause   . Rosacea     Past Surgical History  Procedure Laterality Date  . Eardrum surgery    . Nasal sinus surgery      History   Social History  . Marital Status: Married    Spouse Name: N/A    Number of Children: 2  . Years of Education: N/A   Occupational History  . [employer]    Social History Main Topics  . Smoking status: Never Smoker   . Smokeless tobacco: Never Used  . Alcohol Use: Yes     Comment: red wine sometimes   . Drug Use: No  . Sexual Activity: Not on file   Other Topics Concern  . Not on file   Social History Narrative   2 children, 63 and 22 y/o   Regular exercise- no but very active at work               Medication List       This list is accurate as of: 08/16/13  3:34 PM.  Always use your most recent med list.               albuterol 108 (90 BASE) MCG/ACT inhaler  Commonly known as:  VENTOLIN HFA  Inhale 2 puffs into the lungs every 6 (six) hours as needed for wheezing.     aspirin 81 MG tablet  Take 81 mg by mouth daily.     doxycycline 100 MG capsule  Commonly known as:  VIBRAMYCIN  For rosacea, rx elsewhere     ERRIN 0.35 MG tablet  Generic drug:  norethindrone  Take 1 tablet by mouth daily.     FINACEA 15 % cream  Generic drug:  Azelaic Acid     fluticasone 50  MCG/ACT nasal spray  Commonly known as:  FLONASE  Place 2 sprays into the nose daily.     losartan-hydrochlorothiazide 50-12.5 MG per tablet  Commonly known as:  HYZAAR  TAKE ONE TABLET BY MOUTH ONCE DAILY     rosuvastatin 20 MG tablet  Commonly known as:  CRESTOR  Take 0.5 tablets (10 mg total) by mouth daily.     sulfamethoxazole-trimethoprim 800-160 MG per tablet  Commonly known as:  BACTRIM DS  Take 1 tablet by mouth 2 (two) times daily.           Objective:   Physical Exam BP 147/84  Pulse 86  Temp(Src) 98.1 F (36.7 C)  Wt 153 lb (69.4 kg)  SpO2 98% General -- alert, well-developed, NAD.   HEENT-- Not pale. TMs: + scars ( from previous infections ?) but no bulging -red-d/c, throat symmetric, no redness or discharge. Face symmetric, sinuses not tender to palpation. Nose congested.  Lungs -- normal respiratory effort, no  intercostal retractions, no accessory muscle use, and normal breath sounds.  Heart-- normal rate, regular rhythm, no murmur.   Extremities-- no pretibial edema bilaterally  Neurologic--  alert & oriented X3. Speech normal, gait normal, strength normal in all extremities.  EOMI,  Psych-- Cognition and judgment appear intact. Cooperative with normal attention span and concentration. No anxious or depressed appearing.        Assessment & Plan:   URI,  early sinusitis?  Allergic component? See instructions. Patient is allergic to penicillin, she felt a Z-Pak was "too strong" ; takes doxy for rosacea consequently we'll use Bactrim if needed

## 2013-08-18 ENCOUNTER — Telehealth: Payer: Self-pay | Admitting: Internal Medicine

## 2013-08-18 ENCOUNTER — Other Ambulatory Visit (HOSPITAL_COMMUNITY): Payer: Self-pay | Admitting: Obstetrics & Gynecology

## 2013-08-18 DIAGNOSIS — Z1231 Encounter for screening mammogram for malignant neoplasm of breast: Secondary | ICD-10-CM

## 2013-08-18 NOTE — Telephone Encounter (Signed)
Ok to write work excuse 

## 2013-08-18 NOTE — Telephone Encounter (Signed)
Patient called and stated that dr Larose Kells was suppose to write her a letter for visit 08/16/2013 and she returned back to work on 08/17/2013. Please advise

## 2013-08-18 NOTE — Telephone Encounter (Signed)
Done . Pt notified.  

## 2013-09-23 ENCOUNTER — Ambulatory Visit (HOSPITAL_COMMUNITY)
Admission: RE | Admit: 2013-09-23 | Discharge: 2013-09-23 | Disposition: A | Discharge status: Home / Self Care | Payer: Private Health Insurance - Indemnity | Source: Ambulatory Visit | Attending: Obstetrics & Gynecology | Admitting: Obstetrics & Gynecology

## 2013-09-23 DIAGNOSIS — Z1231 Encounter for screening mammogram for malignant neoplasm of breast: Secondary | ICD-10-CM | POA: Insufficient documentation

## 2013-10-17 ENCOUNTER — Ambulatory Visit (INDEPENDENT_AMBULATORY_CARE_PROVIDER_SITE_OTHER): Payer: Private Health Insurance - Indemnity | Admitting: Family Medicine

## 2013-10-17 VITALS — BP 145/86 | HR 92

## 2013-10-17 DIAGNOSIS — IMO0002 Reserved for concepts with insufficient information to code with codable children: Secondary | ICD-10-CM

## 2013-10-17 DIAGNOSIS — M25569 Pain in unspecified knee: Secondary | ICD-10-CM

## 2013-10-17 DIAGNOSIS — S76319A Strain of muscle, fascia and tendon of the posterior muscle group at thigh level, unspecified thigh, initial encounter: Secondary | ICD-10-CM

## 2013-10-17 MED ORDER — IBUPROFEN 600 MG PO TABS: 600.0000 mg | ORAL_TABLET | Freq: Three times a day (TID) | ORAL | Status: DC | PRN

## 2013-10-18 ENCOUNTER — Encounter: Payer: Self-pay | Admitting: Family Medicine

## 2013-10-18 DIAGNOSIS — M25569 Pain in unspecified knee: Secondary | ICD-10-CM | POA: Insufficient documentation

## 2013-10-18 NOTE — Progress Notes (Signed)
Patient ID: Barbara Flores, female   DOB: 1960-11-30, 53 y.o.   MRN: [id number]  PCP: Barbara November, MD  Subjective:   HPI: Patient is a 53 y.o. female here for right leg pain.  Patient reports for past month she has had posterior right leg pain. No known injury. Feels like she pulled a muscle here. No numbness or tingling. No back pain. No radiation of the pain. Has tried some ibuprofen for this.  Past Medical History  Diagnosis Date  . Allergic rhinitis   . Hyperlipidemia   . History of exercise stress test 07-2010    normal except for elevated BP  . Diabetes mellitus 08/23/2010  . Asthma   . Menopause   . Rosacea     Current Outpatient Prescriptions on File Prior to Visit  Medication Sig Dispense Refill  . albuterol (VENTOLIN HFA) 108 (90 BASE) MCG/ACT inhaler Inhale 2 puffs into the lungs every 6 (six) hours as needed for wheezing.  1 Inhaler  1  . aspirin 81 MG tablet Take 81 mg by mouth daily.        Marland Kitchen doxycycline (VIBRAMYCIN) 100 MG capsule For rosacea, rx elsewhere      . ERRIN 0.35 MG tablet Take 1 tablet by mouth daily.      Marland Kitchen FINACEA 15 % cream       . fluticasone (FLONASE) 50 MCG/ACT nasal spray Place 2 sprays into the nose daily.  16 g  6  . losartan-hydrochlorothiazide (HYZAAR) 50-12.5 MG per tablet TAKE ONE TABLET BY MOUTH ONCE DAILY  30 tablet  6  . rosuvastatin (CRESTOR) 20 MG tablet Take 0.5 tablets (10 mg total) by mouth daily.  30 tablet  6  . sulfamethoxazole-trimethoprim (BACTRIM DS) 800-160 MG per tablet Take 1 tablet by mouth 2 (two) times daily.  20 tablet  0   No current facility-administered medications on file prior to visit.    Past Surgical History  Procedure Laterality Date  . Eardrum surgery    . Nasal sinus surgery      Allergies  Allergen Reactions  . Penicillins     History   Social History  . Marital Status: Married    Spouse Name: N/A    Number of Children: 2  . Years of Education: N/A   Occupational History  . [employer]    Social  History Main Topics  . Smoking status: Never Smoker   . Smokeless tobacco: Never Used  . Alcohol Use: Yes     Comment: red wine sometimes   . Drug Use: No  . Sexual Activity: Not on file   Other Topics Concern  . Not on file   Social History Narrative   2 children, 48 and 93 y/o   Regular exercise- no but very active at work           Family History  Problem Relation Age of Onset  . Diabetes Mother   . Hyperlipidemia Mother   . Hypertension Mother   . Stroke Mother   . Heart attack Neg Hx   . Breast cancer Sister 33  . Stomach cancer Father   . Colon cancer Neg Hx     BP 145/86  Pulse 92  Review of Systems: See HPI above.    Objective:  Physical Exam:  Gen: NAD  Right leg: No gross deformity, swelling, bruising, muscle defect. TTP mid-hamstring reproducing her pain.  No defect here. FROM knee - mild pain on resisted knee flexion at both 90 and  30 degrees.  Strength 4-/5 at 30 degrees. NVI distally. Negative logroll of hip.    Assessment & Plan:  1. Right leg pain - 2/2 hamstring strain from overuse (does a lot of walking).  Shown home rehab exercises to do daily, handout provided.  Thigh sleeve - she will find one OTC instead of ours here.  Regular ibuprofen for next 7-10 days then as needed.  Consider physical therapy if not improving.  F/u prn.

## 2013-10-18 NOTE — Assessment & Plan Note (Signed)
2/2 hamstring strain from overuse (does a lot of walking).  Shown home rehab exercises to do daily, handout provided.  Thigh sleeve - she will find one OTC instead of ours here.  Regular ibuprofen for next 7-10 days then as needed.  Consider physical therapy if not improving.  F/u prn.

## 2013-10-20 ENCOUNTER — Telehealth: Payer: Self-pay | Admitting: Internal Medicine

## 2013-10-20 DIAGNOSIS — I10 Essential (primary) hypertension: Secondary | ICD-10-CM

## 2013-10-20 MED ORDER — LOSARTAN POTASSIUM-HCTZ 50-12.5 MG PO TABS: ORAL_TABLET | ORAL | Status: DC

## 2013-10-20 NOTE — Telephone Encounter (Signed)
Caller name:Jadalee Marbach Relation to MK:[id number] Call back number: Pharmacy:[facility] on [address]  Reason for call: to request a refill for losartan-hydrochlorothiazide a 90 day supply.

## 2013-10-20 NOTE — Telephone Encounter (Signed)
Rf for [employer] sent to [facility]

## 2013-11-04 ENCOUNTER — Telehealth: Payer: Self-pay | Admitting: Internal Medicine

## 2013-11-04 DIAGNOSIS — I10 Essential (primary) hypertension: Secondary | ICD-10-CM

## 2013-11-04 MED ORDER — LOSARTAN POTASSIUM-HCTZ 50-12.5 MG PO TABS: ORAL_TABLET | ORAL | Status: DC

## 2013-11-04 NOTE — Telephone Encounter (Signed)
90 day supply with 1 refill of Losartan-HCTZ e-scribed to [facility], [place]. JG//CMA

## 2013-11-04 NOTE — Telephone Encounter (Signed)
Caller name: Anaiyah  Call back number:[phone]   Reason for call: to request a refill for losartan-hydrochlorothiazide a 90 day supply.  The pharmacy only received x30 with 6 refills.

## 2013-11-08 ENCOUNTER — Telehealth: Payer: Self-pay | Admitting: Internal Medicine

## 2013-11-08 NOTE — Telephone Encounter (Signed)
Relevant patient education assigned to patient using Emmi. ° °

## 2013-11-10 ENCOUNTER — Other Ambulatory Visit: Payer: Self-pay | Admitting: *Deleted

## 2014-01-30 ENCOUNTER — Encounter: Payer: Self-pay | Admitting: Internal Medicine

## 2014-01-30 ENCOUNTER — Ambulatory Visit (INDEPENDENT_AMBULATORY_CARE_PROVIDER_SITE_OTHER): Payer: Private Health Insurance - Indemnity | Admitting: Internal Medicine

## 2014-01-30 VITALS — BP 146/77 | HR 75 | Temp 98.2°F | Ht 60.0 in | Wt 149.5 lb

## 2014-01-30 DIAGNOSIS — Z0000 Encounter for general adult medical examination without abnormal findings: Secondary | ICD-10-CM

## 2014-01-30 DIAGNOSIS — E039 Hypothyroidism, unspecified: Secondary | ICD-10-CM

## 2014-01-30 DIAGNOSIS — Z23 Encounter for immunization: Secondary | ICD-10-CM

## 2014-01-30 DIAGNOSIS — E785 Hyperlipidemia, unspecified: Secondary | ICD-10-CM

## 2014-01-30 DIAGNOSIS — E119 Type 2 diabetes mellitus without complications: Secondary | ICD-10-CM

## 2014-01-30 LAB — COMPREHENSIVE METABOLIC PANEL
ALK PHOS: 55 U/L (ref 39–117)
ALT: 14 U/L (ref 0–35)
AST: 16 U/L (ref 0–37)
Albumin: 4.3 g/dL (ref 3.5–5.2)
BILIRUBIN TOTAL: 0.6 mg/dL (ref 0.2–1.2)
BUN: 12 mg/dL (ref 6–23)
CO2: 25 meq/L (ref 19–32)
CREATININE: 0.7 mg/dL (ref 0.4–1.2)
Calcium: 9 mg/dL (ref 8.4–10.5)
Chloride: 105 mEq/L (ref 96–112)
GFR: 106.43 mL/min (ref >60.00)
Glucose, Bld: 79 mg/dL (ref 70–99)
Potassium: 3.7 mEq/L (ref 3.5–5.1)
Sodium: 138 mEq/L (ref 135–145)
Total Protein: 7.7 g/dL (ref 6.0–8.3)

## 2014-01-30 LAB — CBC WITH DIFFERENTIAL/PLATELET
BASOS ABS: 0 10*3/uL (ref 0.0–0.1)
Basophils Relative: 0.3 % (ref 0.0–3.0)
Eosinophils Absolute: 0.1 10*3/uL (ref 0.0–0.7)
Eosinophils Relative: 0.9 % (ref 0.0–5.0)
HEMATOCRIT: 41.4 % (ref 36.0–46.0)
HEMOGLOBIN: 14.2 g/dL (ref 12.0–15.0)
LYMPHS ABS: 2.9 10*3/uL (ref 0.7–4.0)
Lymphocytes Relative: 32.7 % (ref 12.0–46.0)
MCHC: 34.3 g/dL (ref 30.0–36.0)
MCV: 90.1 fl (ref 78.0–100.0)
MONOS PCT: 7.4 % (ref 3.0–12.0)
Monocytes Absolute: 0.7 10*3/uL (ref 0.1–1.0)
NEUTROS ABS: 5.3 10*3/uL (ref 1.4–7.7)
Neutrophils Relative %: 58.7 % (ref 43.0–77.0)
Platelets: 278 10*3/uL (ref 150.0–400.0)
RBC: 4.6 Mil/uL (ref 3.87–5.11)
RDW: 14.3 % (ref 11.5–15.5)
WBC: 9 10*3/uL (ref 4.0–10.5)

## 2014-01-30 LAB — TSH: TSH: 0.27 u[IU]/mL — ABNORMAL LOW (ref 0.35–4.50)

## 2014-01-30 LAB — LIPID PANEL
CHOL/HDL RATIO: 4
Cholesterol: 225 mg/dL — ABNORMAL HIGH (ref 0–200)
HDL: 52.7 mg/dL (ref >39.00)
LDL Cholesterol: 156 mg/dL — ABNORMAL HIGH (ref 0–99)
NONHDL: 172.3
Triglycerides: 82 mg/dL (ref 0.0–149.0)
VLDL: 16.4 mg/dL (ref 0.0–40.0)

## 2014-01-30 LAB — HEMOGLOBIN A1C: HEMOGLOBIN A1C: 5.9 % (ref 4.6–6.5)

## 2014-01-30 NOTE — Assessment & Plan Note (Addendum)
Td 2012 pnm shot today Never had a colonoscopy, no family history of colon cancer, different options discussed, elected IFOB Female care per gynecologist   neg MMG 08-2013 ( she has a family history of breast cancer) labs Diet -- doing better  exercise discussed Chronic medical problems: Seems to be taken the medications as prescribed, labs, RTC 6 months

## 2014-01-30 NOTE — Progress Notes (Signed)
Subjective:    Patient ID: Barbara Flores, female    DOB: 28-Nov-1960, 53 y.o.   MRN: [id number]  DOS:  01/30/2014 Type of visit - description: CPX History: Feeling well, ambulatory BPs 120/75 usually, no recent ambulatory blood sugars. Good compliance of medications. Diet has improved    ROS No  CP, SOB Denies  nausea, vomiting diarrhea, blood in the stools No abdominal pain (-) cough, sputum production (-) wheezing, chest congestion No dysuria, gross hematuria, difficulty urinating  No anxiety, depressio No headaches.Denies dizziness       Past Medical History  Diagnosis Date  . Allergic rhinitis   . Hyperlipidemia   . History of exercise stress test 07-2010    normal except for elevated BP  . Diabetes mellitus 08/23/2010  . Asthma   . Menopause   . Rosacea     Past Surgical History  Procedure Laterality Date  . Eardrum surgery    . Nasal sinus surgery      History   Social History  . Marital Status: Married    Spouse Name: N/A    Number of Children: 2  . Years of Education: N/A   Occupational History  . [employer]    Social History Main Topics  . Smoking status: Never Smoker   . Smokeless tobacco: Never Used  . Alcohol Use: Yes     Comment: red wine sometimes   . Drug Use: No  . Sexual Activity: Not on file   Other Topics Concern  . Not on file   Social History Narrative   Household pt, husband, 1 child    2 children, 23 and 69 y/o                  Medication List       This list is accurate as of: 01/30/14  7:09 PM.  Always use your most recent med list.               albuterol 108 (90 BASE) MCG/ACT inhaler  Commonly known as:  VENTOLIN HFA  Inhale 2 puffs into the lungs every 6 (six) hours as needed for wheezing.     aspirin 81 MG tablet  Take 81 mg by mouth daily.     doxycycline 100 MG capsule  Commonly known as:  VIBRAMYCIN  For rosacea, rx elsewhere     ERRIN 0.35 MG tablet  Generic drug:  norethindrone  Take 1 tablet by  mouth daily.     FINACEA 15 % cream  Generic drug:  Azelaic Acid     fluticasone 50 MCG/ACT nasal spray  Commonly known as:  FLONASE  Place 2 sprays into the nose daily.     ibuprofen 600 MG tablet  Commonly known as:  ADVIL,MOTRIN  Take 1 tablet (600 mg total) by mouth every 8 (eight) hours as needed.     losartan-hydrochlorothiazide 50-12.5 MG per tablet  Commonly known as:  HYZAAR  TAKE ONE TABLET BY MOUTH ONCE DAILY     rosuvastatin 20 MG tablet  Commonly known as:  CRESTOR  Take 0.5 tablets (10 mg total) by mouth daily.           Objective:   Physical Exam BP 146/77  Pulse 75  Temp(Src) 98.2 F (36.8 C) (Oral)  Ht 5' (1.524 m)  Wt 149 lb 8 oz (67.813 kg)  BMI 29.20 kg/m2  SpO2 98%  LMP 01/30/2014 General -- alert, well-developed, NAD.  Neck --no thyromegaly  HEENT-- Not pale.  Lungs -- normal respiratory effort, no intercostal retractions, no accessory muscle use, and normal breath sounds.  Heart-- normal rate, regular rhythm, no murmur.  Abdomen-- Not distended, good bowel sounds,soft, non-tender.  Extremities-- no pretibial edema bilaterally  Neurologic--  alert & oriented X3. Speech normal, gait appropriate for age, strength symmetric and appropriate for age.  Psych-- Cognition and judgment appear intact. Cooperative with normal attention span and concentration. No anxious or depressed appearing.      Assessment & Plan:

## 2014-01-30 NOTE — Patient Instructions (Signed)
Get your blood work before you leave   Next visit is for routine check up regards your blood sugar , blood pressure in 6 months  No need to come back fasting Please make an appointment

## 2014-01-30 NOTE — Progress Notes (Signed)
Pre-visit discussion using our clinic review tool. No additional management support is needed unless otherwise documented below in the visit note.  

## 2014-02-02 ENCOUNTER — Telehealth: Payer: Self-pay | Admitting: Internal Medicine

## 2014-02-02 MED ORDER — ROSUVASTATIN CALCIUM 20 MG PO TABS
20.0000 mg | ORAL_TABLET | Freq: Every day | ORAL | Status: DC
Start: 2014-02-02 — End: 2014-02-17

## 2014-02-02 NOTE — Telephone Encounter (Signed)
Caller name: Harbor  Relation to pt: self  Call back number: [phone]  Reason for call: pt requesting medication change due to price being excessive. Medication is rosuvastatin (CRESTOR) 20 MG tablet and in addition requesting a 90 day supply due to insurance coverage. Pt would like a follow up regarding the new medication

## 2014-02-02 NOTE — Addendum Note (Signed)
Addended by: Wilfrid Lund on: 02/02/2014 01:29 PM   Modules accepted: Orders

## 2014-02-02 NOTE — Telephone Encounter (Signed)
Please Advise

## 2014-02-03 ENCOUNTER — Other Ambulatory Visit: Payer: Self-pay

## 2014-02-03 MED ORDER — SIMVASTATIN 40 MG PO TABS: 40.0000 mg | ORAL_TABLET | Freq: Every day | ORAL | Status: DC

## 2014-02-03 NOTE — Telephone Encounter (Signed)
Change to simvastatin 40 mg 1 by mouth each bedtime #90, no refills. Needs labs in 6 weeks as planned

## 2014-02-03 NOTE — Telephone Encounter (Signed)
Medication sent to [place], appt scheduled 04/17/2014 for lab work.

## 2014-02-17 ENCOUNTER — Ambulatory Visit (INDEPENDENT_AMBULATORY_CARE_PROVIDER_SITE_OTHER): Payer: Private Health Insurance - Indemnity | Admitting: Internal Medicine

## 2014-02-17 ENCOUNTER — Encounter: Payer: Self-pay | Admitting: Internal Medicine

## 2014-02-17 VITALS — BP 132/84 | HR 87 | Temp 98.2°F | Wt 148.8 lb

## 2014-02-17 DIAGNOSIS — R109 Unspecified abdominal pain: Secondary | ICD-10-CM

## 2014-02-17 NOTE — Progress Notes (Signed)
Pre visit review using our clinic review tool, if applicable. No additional management support is needed unless otherwise documented below in the visit note. 

## 2014-02-17 NOTE — Patient Instructions (Signed)
Go to the lab before you leave   Call if not better in few days or if the pain is severe

## 2014-02-17 NOTE — Progress Notes (Signed)
Subjective:    Patient ID: Barbara Flores, female    DOB: 24-Aug-1960, 53 y.o.   MRN: [id number]  DOS:  02/17/2014 Type of visit - description : Acute Interval history: 2 months history of on and off pain at the right anterior flank, pain is burning/sharp, last a minute or 2. No associated with nausea but increase after she eats.  ROS Denies fever or chills. No constipation, diarrhea or blood in the stools. No rash @ the flank. No dysuria, gross motor or difficulty urinating.   Past Medical History  Diagnosis Date  . Allergic rhinitis   . Hyperlipidemia   . History of exercise stress test 07-2010    normal except for elevated BP  . Diabetes mellitus 08/23/2010  . Asthma   . Menopause   . Rosacea     Past Surgical History  Procedure Laterality Date  . Eardrum surgery    . Nasal sinus surgery      History   Social History  . Marital Status: Married    Spouse Name: N/A    Number of Children: 2  . Years of Education: N/A   Occupational History  . [employer]    Social History Main Topics  . Smoking status: Never Smoker   . Smokeless tobacco: Never Used  . Alcohol Use: Yes     Comment: red wine sometimes   . Drug Use: No  . Sexual Activity: Not on file   Other Topics Concern  . Not on file   Social History Narrative   Household pt, husband, 1 child    2 children, 71 and 18 y/o                  Medication List       This list is accurate as of: 02/17/14 11:59 PM.  Always use your most recent med list.               albuterol 108 (90 BASE) MCG/ACT inhaler  Commonly known as:  VENTOLIN HFA  Inhale 2 puffs into the lungs every 6 (six) hours as needed for wheezing.     aspirin 81 MG tablet  Take 81 mg by mouth daily.     doxycycline 100 MG capsule  Commonly known as:  VIBRAMYCIN  For rosacea, rx elsewhere     ERRIN 0.35 MG tablet  Generic drug:  norethindrone  Take 1 tablet by mouth daily.     FINACEA 15 % cream  Generic drug:  Azelaic Acid     fluticasone 50 MCG/ACT nasal spray  Commonly known as:  FLONASE  Place 2 sprays into the nose daily.     ibuprofen 600 MG tablet  Commonly known as:  ADVIL,MOTRIN  Take 1 tablet (600 mg total) by mouth every 8 (eight) hours as needed.     losartan-hydrochlorothiazide 50-12.5 MG per tablet  Commonly known as:  HYZAAR  TAKE ONE TABLET BY MOUTH ONCE DAILY     simvastatin 40 MG tablet  Commonly known as:  ZOCOR  Take 1 tablet (40 mg total) by mouth at bedtime.           Objective:   Physical Exam  Abdominal:     BP 132/84  Pulse 87  Temp(Src) 98.2 F (36.8 C) (Oral)  Wt 148 lb 13 oz (67.5 kg)  SpO2 98%  LMP 01/30/2014  General -- alert, well-developed, NAD.   HEENT-- Not pale. Lungs -- normal respiratory effort, no intercostal retractions, no accessory muscle  use, and normal breath sounds.  Heart-- normal rate, regular rhythm, no murmur.  Abdomen-- Not distended, good bowel sounds,soft, non-tender. No rebound or rigidity. No mass,organomegaly. Extremities-- no pretibial edema bilaterally  Neurologic--  alert & oriented X3. Speech normal, gait appropriate for age, strength symmetric and appropriate for age.   Psych-- Cognition and judgment appear intact. Cooperative with normal attention span and concentration. No anxious or depressed appearing.       Assessment & Plan:    Right flank pain Anterior right flank pain for the last 2 months, review of systems is negative except for the fact that it increase after she eats. DDX: Atypical presentation of gallbladder or kidney stones. Plan:  UA, ultrasound, If workup negative will recommend observation for a few weeks but knows to  call me if she's not improving after that

## 2014-02-18 LAB — URINALYSIS, ROUTINE W REFLEX MICROSCOPIC
Bilirubin Urine: NEGATIVE
Glucose, UA: NEGATIVE mg/dL
Hgb urine dipstick: NEGATIVE
Ketones, ur: NEGATIVE mg/dL
LEUKOCYTES UA: NEGATIVE
NITRITE: NEGATIVE
PH: 7 (ref 5.0–8.0)
Protein, ur: NEGATIVE mg/dL
Specific Gravity, Urine: 1.009 (ref 1.005–1.030)
Urobilinogen, UA: 0.2 mg/dL (ref 0.0–1.0)

## 2014-02-21 ENCOUNTER — Ambulatory Visit (HOSPITAL_BASED_OUTPATIENT_CLINIC_OR_DEPARTMENT_OTHER)
Admission: RE | Admit: 2014-02-21 | Discharge: 2014-02-21 | Disposition: A | Discharge status: Home / Self Care | Payer: Managed Care, Other (non HMO) | Source: Ambulatory Visit | Attending: Internal Medicine | Admitting: Internal Medicine

## 2014-02-21 DIAGNOSIS — R1011 Right upper quadrant pain: Secondary | ICD-10-CM | POA: Diagnosis present

## 2014-02-21 DIAGNOSIS — R109 Unspecified abdominal pain: Secondary | ICD-10-CM

## 2014-02-21 DIAGNOSIS — E7800 Pure hypercholesterolemia, unspecified: Secondary | ICD-10-CM | POA: Diagnosis not present

## 2014-02-21 DIAGNOSIS — I10 Essential (primary) hypertension: Secondary | ICD-10-CM | POA: Insufficient documentation

## 2014-02-21 DIAGNOSIS — R7309 Other abnormal glucose: Secondary | ICD-10-CM | POA: Diagnosis not present

## 2014-02-21 DIAGNOSIS — R935 Abnormal findings on diagnostic imaging of other abdominal regions, including retroperitoneum: Secondary | ICD-10-CM | POA: Insufficient documentation

## 2014-02-22 ENCOUNTER — Other Ambulatory Visit (INDEPENDENT_AMBULATORY_CARE_PROVIDER_SITE_OTHER): Payer: Managed Care, Other (non HMO)

## 2014-02-22 DIAGNOSIS — Z0000 Encounter for general adult medical examination without abnormal findings: Secondary | ICD-10-CM

## 2014-02-22 LAB — FECAL OCCULT BLOOD, IMMUNOCHEMICAL: FECAL OCCULT BLD: NEGATIVE

## 2014-03-06 ENCOUNTER — Telehealth: Payer: Self-pay | Admitting: Internal Medicine

## 2014-03-06 NOTE — Telephone Encounter (Signed)
Please advise 

## 2014-03-06 NOTE — Telephone Encounter (Signed)
Spoke with Pt and gave her instructions and advise on medication per Dr. Larose Kells.

## 2014-03-06 NOTE — Telephone Encounter (Signed)
Pt states that she is still having pain on the right side, and wants to know if dr. Larose Kells can call her in an rx to take when the pain occurs or if there is an otc he suggest.

## 2014-03-06 NOTE — Telephone Encounter (Signed)
Recommend Tylenol  500 mg OTC 2 tabs a day every 8 hours as needed for pain

## 2014-03-31 ENCOUNTER — Encounter: Payer: Self-pay | Admitting: Family

## 2014-03-31 ENCOUNTER — Ambulatory Visit (INDEPENDENT_AMBULATORY_CARE_PROVIDER_SITE_OTHER): Payer: Managed Care, Other (non HMO) | Admitting: Family

## 2014-03-31 VITALS — BP 161/76 | HR 74 | Temp 98.0°F | Resp 18 | Ht 60.0 in | Wt 149.4 lb

## 2014-03-31 DIAGNOSIS — J4520 Mild intermittent asthma, uncomplicated: Secondary | ICD-10-CM

## 2014-03-31 DIAGNOSIS — R059 Cough, unspecified: Secondary | ICD-10-CM | POA: Insufficient documentation

## 2014-03-31 DIAGNOSIS — R05 Cough: Secondary | ICD-10-CM

## 2014-03-31 DIAGNOSIS — I10 Essential (primary) hypertension: Secondary | ICD-10-CM

## 2014-03-31 MED ORDER — BENZONATATE 100 MG PO CAPS: 100.0000 mg | ORAL_CAPSULE | Freq: Three times a day (TID) | ORAL | Status: DC | PRN

## 2014-03-31 MED ORDER — ALBUTEROL SULFATE HFA 108 (90 BASE) MCG/ACT IN AERS: 2.0000 | INHALATION_SPRAY | Freq: Four times a day (QID) | RESPIRATORY_TRACT | Status: DC | PRN

## 2014-03-31 NOTE — Progress Notes (Signed)
Pre visit review using our clinic review tool, if applicable. No additional management support is needed unless otherwise documented below in the visit note. 

## 2014-03-31 NOTE — Assessment & Plan Note (Addendum)
Most likely etiology: Viral versus allergy. Trial of zyrtec and prn tessalon. May use albuterol as needed. Pt is instructed to call if symptoms worsen or if not improved in 1 week.

## 2014-03-31 NOTE — Patient Instructions (Signed)
You may use tessalon as needed for cough. Add zyrtec once daily You may use albuterol as needed. Call if symptoms worsen, if you develop fever,  or if not improved in 1 week.

## 2014-03-31 NOTE — Assessment & Plan Note (Signed)
BP up today, but pt just resumed meds and has been well controlled on previous visits. Continue current meds.

## 2014-03-31 NOTE — Progress Notes (Signed)
Subjective:    Patient ID: Barbara Flores, female    DOB: 29-Aug-1960, 54 y.o.   MRN: [id number]  HPI  Barbara Flores is a 53 yr old female who presents today with chief complaint of cough. Reports that she developed cough while in [place] 2 weeks ago. This apparently occurred after eating fish and has been associated with an itchy throat. Reports that her cough is dry.  Denies associated fever.  Last night she reports that she developed some sinus drainage.  She reports that she was exposed to 2 family members in [place] with cough.  Reports mild nausea today which she attributes to sinus drainage.  Denies hemoptysis.   HTN- reports that she was out of BP meds x 3 weeks and restarted today.  BP Readings from Last 3 Encounters:  03/31/14 161/76  02/17/14 132/84  01/30/14 146/77      Review of Systems See HPI  Past Medical History  Diagnosis Date  . Allergic rhinitis   . Hyperlipidemia   . History of exercise stress test 07-2010    normal except for elevated BP  . Diabetes mellitus 08/23/2010  . Asthma   . Menopause   . Rosacea     History   Social History  . Marital Status: Married    Spouse Name: N/A    Number of Children: 2  . Years of Education: N/A   Occupational History  . [employer]    Social History Main Topics  . Smoking status: Never Smoker   . Smokeless tobacco: Never Used  . Alcohol Use: Yes     Comment: red wine sometimes   . Drug Use: No  . Sexual Activity: Not on file   Other Topics Concern  . Not on file   Social History Narrative   Household pt, husband, 1 child    2 children, 68 and 28 y/o              Past Surgical History  Procedure Laterality Date  . Eardrum surgery    . Nasal sinus surgery      Family History  Problem Relation Age of Onset  . Diabetes Mother   . Hyperlipidemia Mother   . Hypertension Mother   . Stroke Mother   . Heart attack Neg Hx   . Breast cancer Sister 33  . Stomach cancer Father   . Colon cancer Neg Hx      Allergies  Allergen Reactions  . Penicillins   . Amoxicillin Rash    Current Outpatient Prescriptions on File Prior to Visit  Medication Sig Dispense Refill  . aspirin 81 MG tablet Take 81 mg by mouth daily.        Marland Kitchen doxycycline (VIBRAMYCIN) 100 MG capsule For rosacea, rx elsewhere      . ERRIN 0.35 MG tablet Take 1 tablet by mouth daily.      Marland Kitchen FINACEA 15 % cream       . ibuprofen (ADVIL,MOTRIN) 600 MG tablet Take 1 tablet (600 mg total) by mouth every 8 (eight) hours as needed.  90 tablet  1  . losartan-hydrochlorothiazide (HYZAAR) 50-12.5 MG per tablet TAKE ONE TABLET BY MOUTH ONCE DAILY  90 tablet  1  . simvastatin (ZOCOR) 40 MG tablet Take 1 tablet (40 mg total) by mouth at bedtime.  90 tablet  0  . fluticasone (FLONASE) 50 MCG/ACT nasal spray Place 2 sprays into the nose daily.  16 g  6   No current facility-administered medications on  file prior to visit.    BP 161/76  Pulse 74  Temp(Src) 98 F (36.7 C) (Oral)  Resp 18  Ht 5' (1.524 m)  Wt 149 lb 6.4 oz (67.767 kg)  BMI 29.18 kg/m2  SpO2 100%       Objective:   Physical Exam  Constitutional: She is oriented to person, place, and time. She appears well-developed and well-nourished. No distress.  HENT:  Head: Normocephalic and atraumatic.  Right Ear: Tympanic membrane and ear canal normal.  Left Ear: Tympanic membrane and ear canal normal.  Mouth/Throat: No oropharyngeal exudate or posterior oropharyngeal erythema.  Cardiovascular: Normal rate and regular rhythm.   No murmur heard. Pulmonary/Chest: Effort normal and breath sounds normal. No respiratory distress. She has no wheezes. She has no rales. She exhibits no tenderness.  Neurological: She is alert and oriented to person, place, and time.  Psychiatric: She has a normal mood and affect. Her behavior is normal. Judgment and thought content normal.          Assessment & Plan:

## 2014-04-04 ENCOUNTER — Telehealth: Payer: Self-pay | Admitting: Internal Medicine

## 2014-04-04 MED ORDER — AZITHROMYCIN 250 MG PO TABS: ORAL_TABLET | ORAL | Status: DC

## 2014-04-04 NOTE — Telephone Encounter (Signed)
Caller name: Hasset  Call back number:[phone] Pharmacy: [facility]  Reason for call:  Pt was seen by Melissa on 10/16.  Still having bad cough, would like something called in if we can.  Advise

## 2014-04-04 NOTE — Telephone Encounter (Signed)
Rx sent for zpak. She can use delsym as needed for cough. Needs follow up in office if not improved in 2-3 days.

## 2014-04-04 NOTE — Telephone Encounter (Signed)
Notified pt and she voices understanding. 

## 2014-04-06 ENCOUNTER — Ambulatory Visit (HOSPITAL_BASED_OUTPATIENT_CLINIC_OR_DEPARTMENT_OTHER)
Admission: RE | Admit: 2014-04-06 | Discharge: 2014-04-06 | Disposition: A | Discharge status: Home / Self Care | Payer: Managed Care, Other (non HMO) | Source: Ambulatory Visit | Attending: Medical | Admitting: Medical

## 2014-04-06 ENCOUNTER — Ambulatory Visit (INDEPENDENT_AMBULATORY_CARE_PROVIDER_SITE_OTHER): Payer: Managed Care, Other (non HMO) | Admitting: Medical

## 2014-04-06 ENCOUNTER — Encounter: Payer: Self-pay | Admitting: Medical

## 2014-04-06 VITALS — BP 143/81 | HR 86 | Temp 98.6°F | Ht 60.0 in | Wt 146.6 lb

## 2014-04-06 DIAGNOSIS — R05 Cough: Secondary | ICD-10-CM

## 2014-04-06 DIAGNOSIS — R059 Cough, unspecified: Secondary | ICD-10-CM

## 2014-04-06 MED ORDER — BECLOMETHASONE DIPROPIONATE 40 MCG/ACT IN AERS: 2.0000 | INHALATION_SPRAY | Freq: Two times a day (BID) | RESPIRATORY_TRACT | Status: DC

## 2014-04-06 MED ORDER — HYDROCODONE-HOMATROPINE 5-1.5 MG/5ML PO SYRP: 5.0000 mL | ORAL_SOLUTION | Freq: Three times a day (TID) | ORAL | Status: DC | PRN

## 2014-04-06 MED ORDER — PREDNISONE 20 MG PO TABS: 20.0000 mg | ORAL_TABLET | Freq: Two times a day (BID) | ORAL | Status: DC

## 2014-04-06 NOTE — Patient Instructions (Signed)
For your cough that is mostly at night, I will prescribe 3 days of oral prednisone, qvar inhaler and continue albuterol inhaler every 4-6 hours as needed shortness of breath or wheezing.Your cough at night may represent a asthma flair. I will prescribe hydromet for your cough as well. Discontinue the benzonatate.  Continue azithromycin. I do want to get cxr today.  Follow up in 7 days or as needed.

## 2014-04-06 NOTE — Progress Notes (Signed)
Pre visit review using our clinic review tool, if applicable. No additional management support is needed unless otherwise documented below in the visit note. 

## 2014-04-06 NOTE — Progress Notes (Signed)
   Subjective:    Patient ID: Barbara Flores, female    DOB: [birth year], 53 y.o.   MRN: [id number]  HPI  Pt in with coughing for 3 weeks. Pt saw Melissa NP last week. Pt initially on zyrtec, benzonatate and albuterol(if needed.)  Pt states that albuterol did help some with dry cough. Zyrtec she state did not help much. Neither did benzonatate. Pt does have history of asthma. No sneezing or itching eyes.  Pt is on azithromycin. She is on the last day.   Pt states most of cough is at night. I did not hear any cough at all during the exam. Then later as I was giving her the AVS she started to get a tickle in her throat and cough a little bit.  She reports no fevers,chills or diaphoresis.    Review of Systems  Constitutional: Negative for fever, chills, diaphoresis and fatigue.  HENT: Negative for congestion, drooling, ear pain, hearing loss, nosebleeds, postnasal drip, rhinorrhea, sinus pressure, sneezing and sore throat.   Respiratory: Positive for cough. Negative for chest tightness, shortness of breath and wheezing.   Cardiovascular: Negative for chest pain and palpitations.  Gastrointestinal: Negative.   Genitourinary: Negative.   Musculoskeletal: Negative.   Skin: Negative.   Neurological: Negative.   Hematological: Negative for adenopathy. Does not bruise/bleed easily.  Psychiatric/Behavioral: Negative.        Objective:   Physical Exam  General  Mental Status - Alert. General Appearance - Well groomed. Not in acute distress.  Skin Rashes- No Rashes.  HEENT Head- Normal. Ear Auditory Canal - Left- Normal. Right - Normal.Tympanic Membrane- Left- Normal. Right- Normal. Eye Sclera/Conjunctiva- Left- Normal. Right- Normal. Nose & Sinuses Nasal Mucosa- Left- mild Boggy = Congested. Right- mild Boggy = Congested. Mouth & Throat Lips: Upper Lip- Normal: no dryness, cracking, pallor, cyanosis, or vesicular eruption. Lower Lip-Normal: no dryness, cracking, pallor, cyanosis or  vesicular eruption. Buccal Mucosa- Bilateral- No Aphthous ulcers. Oropharynx- No Discharge or Erythema. Tonsils: Characteristics- Bilateral- No Erythema or Congestion. Size/Enlargement- Bilateral- No enlargement. Discharge- bilateral-None.  Neck Neck- Supple. No Masses.   Chest and Lung Exam Auscultation: Breath Sounds:-Normal. Clear, even and unlabored. Mild shallow breathing.  Cardiovascular Auscultation:Rythm- Regular, rate and rhythm. Murmurs & Other Heart Sounds:Ausculatation of the heart reveal- No Murmurs.  Lymphatic Head & Neck General Head & Neck Lymphatics: Bilateral: Description- No Localized lymphadenopathy.        Assessment & Plan:

## 2014-04-06 NOTE — Assessment & Plan Note (Addendum)
She does have a history of allergy per her report and most of her cough is at night. She reports that albuterol helps the most of all previous treatments. So I did prescribe her 3 days of short course prednisone, Qvar inhaler and encourage her to continue the albuterol. I did go ahead and order a chest x-ray today. She will finish the last day of Zithromax today. I also advised her she could stop the benzonatate and I did prescribe her Hydromet syrup.

## 2014-04-08 ENCOUNTER — Telehealth: Payer: Self-pay | Admitting: Medical

## 2014-04-08 NOTE — Telephone Encounter (Signed)
Stop the hydromet. She was on benzonatate earlier but on low dose. She can take 2 of the benzonate every 8 hrs prn cough. She was on low dose. Max dose is 200 mg q 8 hrs. If hydromet made her nausea and caused vomiting it is possible codeine based syrup could do the same. Is she having severe coughing episodes and vomiting or just random vomiting? If vomiting persist after med change let us know.

## 2014-04-10 MED ORDER — BENZONATATE 200 MG PO CAPS: 200.0000 mg | ORAL_CAPSULE | Freq: Three times a day (TID) | ORAL | Status: DC | PRN

## 2014-04-10 NOTE — Telephone Encounter (Signed)
Will send in rx of the benzonatate 200 mg tabs. 1 tab po q 8 hrs prn cough.

## 2014-04-10 NOTE — Telephone Encounter (Signed)
Patient notified. Medication sent.

## 2014-04-17 ENCOUNTER — Other Ambulatory Visit: Payer: Private Health Insurance - Indemnity

## 2014-04-19 ENCOUNTER — Other Ambulatory Visit: Payer: Self-pay

## 2014-04-19 ENCOUNTER — Other Ambulatory Visit (INDEPENDENT_AMBULATORY_CARE_PROVIDER_SITE_OTHER): Payer: Managed Care, Other (non HMO)

## 2014-04-19 DIAGNOSIS — E039 Hypothyroidism, unspecified: Secondary | ICD-10-CM

## 2014-04-19 DIAGNOSIS — E785 Hyperlipidemia, unspecified: Secondary | ICD-10-CM

## 2014-04-19 LAB — LIPID PANEL
Cholesterol: 168 mg/dL (ref 0–200)
HDL: 55.5 mg/dL (ref >39.00)
LDL CALC: 86 mg/dL (ref 0–99)
NonHDL: 112.5
Total CHOL/HDL Ratio: 3
Triglycerides: 133 mg/dL (ref 0.0–149.0)
VLDL: 26.6 mg/dL (ref 0.0–40.0)

## 2014-04-19 LAB — T3, FREE: T3 FREE: 3.2 pg/mL (ref 2.3–4.2)

## 2014-04-19 LAB — TSH: TSH: 1.36 u[IU]/mL (ref 0.35–4.50)

## 2014-04-19 LAB — T4, FREE: Free T4: 0.86 ng/dL (ref 0.60–1.60)

## 2014-04-19 MED ORDER — SIMVASTATIN 40 MG PO TABS: 40.0000 mg | ORAL_TABLET | Freq: Every day | ORAL | Status: DC

## 2014-05-01 ENCOUNTER — Telehealth: Payer: Self-pay | Admitting: Internal Medicine

## 2014-05-01 NOTE — Telephone Encounter (Signed)
Saw Edward on 04/06/14, does not feel any better. Can antibiotics be called in ? [facility]. Please advise

## 2014-05-01 NOTE — Telephone Encounter (Signed)
To Barbara Flores

## 2014-05-01 NOTE — Telephone Encounter (Signed)
Pt seen 25 days ago and states not better. Requesting antibiotic. She needs to be seen in office. Too long of time transpired to simpy call in rx.

## 2014-05-01 NOTE — Telephone Encounter (Signed)
Please advise 

## 2014-05-03 ENCOUNTER — Ambulatory Visit (INDEPENDENT_AMBULATORY_CARE_PROVIDER_SITE_OTHER): Payer: Managed Care, Other (non HMO) | Admitting: Medical

## 2014-05-03 ENCOUNTER — Encounter: Payer: Self-pay | Admitting: Medical

## 2014-05-03 VITALS — BP 151/90 | HR 88 | Temp 98.3°F | Ht 60.0 in | Wt 147.8 lb

## 2014-05-03 DIAGNOSIS — J3089 Other allergic rhinitis: Secondary | ICD-10-CM

## 2014-05-03 DIAGNOSIS — I10 Essential (primary) hypertension: Secondary | ICD-10-CM

## 2014-05-03 DIAGNOSIS — R05 Cough: Secondary | ICD-10-CM

## 2014-05-03 DIAGNOSIS — R059 Cough, unspecified: Secondary | ICD-10-CM

## 2014-05-03 MED ORDER — LOSARTAN POTASSIUM-HCTZ 100-25 MG PO TABS: 1.0000 | ORAL_TABLET | Freq: Every day | ORAL | Status: DC

## 2014-05-03 MED ORDER — HYDROCOD POLST-CHLORPHEN POLST 10-8 MG/5ML PO LQCR: 5.0000 mL | Freq: Two times a day (BID) | ORAL | Status: DC | PRN

## 2014-05-03 MED ORDER — METHYLPREDNISOLONE ACETATE 80 MG/ML IJ SUSP
80.0000 mg | Freq: Once | INTRAMUSCULAR | Status: AC
  Administered 2014-05-04: 80 mg via INTRAMUSCULAR

## 2014-05-03 MED ORDER — AZITHROMYCIN 250 MG PO TABS: ORAL_TABLET | ORAL | Status: DC

## 2014-05-03 NOTE — Progress Notes (Signed)
Pre visit review using our clinic review tool, if applicable. No additional management support is needed unless otherwise documented below in the visit note. 

## 2014-05-03 NOTE — Progress Notes (Signed)
Subjective:    Patient ID: Barbara Flores, female    DOB: March 16, 1961, 53 y.o.   MRN: [id number]  HPI  Pt had a cough now for 2.5 months. Started while on trip in [place].  Pt used to see an allergist and weekly injection/ immune therapy years ago but stopped. She stopped that in 2002. Pt feels a lot of post nasal drainage on the back of her throat. She thinks cause is from pnd and has itchy throat.(Sneezing and itching eyes.) No fever and no chills. Pt has not been wheezing. No reflux.  Pt has failed prednisone, qvar, hycodan, zyrtec, benzonatate, and azithromycin.  Pt cxr was negative.  Past Medical History  Diagnosis Date  . Allergic rhinitis   . Hyperlipidemia   . History of exercise stress test 07-2010    normal except for elevated BP  . Diabetes mellitus 08/23/2010  . Asthma   . Menopause   . Rosacea     History   Social History  . Marital Status: Married    Spouse Name: N/A    Number of Children: 2  . Years of Education: N/A   Occupational History  . [employer]    Social History Main Topics  . Smoking status: Never Smoker   . Smokeless tobacco: Never Used  . Alcohol Use: Yes     Comment: red wine sometimes   . Drug Use: No  . Sexual Activity: Not on file   Other Topics Concern  . Not on file   Social History Narrative   Household pt, husband, 1 child    2 children, 82 and 50 y/o              Past Surgical History  Procedure Laterality Date  . Eardrum surgery    . Nasal sinus surgery      Family History  Problem Relation Age of Onset  . Diabetes Mother   . Hyperlipidemia Mother   . Hypertension Mother   . Stroke Mother   . Heart attack Neg Hx   . Breast cancer Sister 84  . Stomach cancer Father   . Colon cancer Neg Hx     Allergies  Allergen Reactions  . Penicillins   . Amoxicillin Rash    Current Outpatient Prescriptions on File Prior to Visit  Medication Sig Dispense Refill  . albuterol (VENTOLIN HFA) 108 (90 BASE) MCG/ACT inhaler  Inhale 2 puffs into the lungs every 6 (six) hours as needed for wheezing. 1 Inhaler 1  . aspirin 81 MG tablet Take 81 mg by mouth daily.      . beclomethasone (QVAR) 40 MCG/ACT inhaler Inhale 2 puffs into the lungs 2 (two) times daily. 1 Inhaler 2  . benzonatate (TESSALON) 200 MG capsule Take 1 capsule (200 mg total) by mouth 3 (three) times daily as needed for cough. 30 capsule 0  . cetirizine (ZYRTEC) 10 MG chewable tablet Chew 10 mg by mouth daily.    Marland Kitchen ERRIN 0.35 MG tablet Take 1 tablet by mouth daily.    Marland Kitchen FINACEA 15 % cream     . HYDROcodone-homatropine (HYCODAN) 5-1.5 MG/5ML syrup Take 5 mLs by mouth every 8 (eight) hours as needed for cough. 120 mL 0  . ibuprofen (ADVIL,MOTRIN) 600 MG tablet Take 1 tablet (600 mg total) by mouth every 8 (eight) hours as needed. 90 tablet 1  . simvastatin (ZOCOR) 40 MG tablet Take 1 tablet (40 mg total) by mouth at bedtime. 90 tablet 1  . fluticasone (FLONASE)  50 MCG/ACT nasal spray Place 2 sprays into the nose daily. 16 g 6   No current facility-administered medications on file prior to visit.    BP 151/90 mmHg  Pulse 88  Temp(Src) 98.3 F (36.8 C) (Oral)  Ht 5' (1.524 m)  Wt 147 lb 12.8 oz (67.042 kg)  BMI 28.87 kg/m2  SpO2 98%  LMP 01/28/2014    Review of Systems  Constitutional: Negative for fever, chills and fatigue.  HENT: Positive for congestion, postnasal drip and sneezing. Negative for ear discharge, ear pain, nosebleeds, rhinorrhea, sinus pressure, sore throat and trouble swallowing.   Eyes: Positive for itching.  Respiratory: Positive for cough. Negative for chest tightness, shortness of breath and wheezing.   Cardiovascular: Negative for chest pain and palpitations.  Gastrointestinal: Negative for nausea, vomiting, abdominal pain, diarrhea and constipation.  Musculoskeletal: Negative for back pain.  Neurological: Negative for dizziness, tremors, seizures, syncope, weakness, light-headedness, numbness and headaches.    Hematological: Negative for adenopathy. Does not bruise/bleed easily.  Psychiatric/Behavioral: Negative for suicidal ideas, behavioral problems and dysphoric mood. The patient is not nervous/anxious.        Objective:   Physical Exam   General  Mental Status - Alert. General Appearance - Well groomed. Not in acute distress.  Skin Rashes- No Rashes.  HEENT Head- Normal. Ear Auditory Canal - Left- Normal. Right - Normal.Tympanic Membrane- Left- Normal. Right- mild dull and red center Eye Sclera/Conjunctiva- Left- Normal. Right- Normal. Nose & Sinuses Nasal Mucosa- Left-  Boggy +Congested. Right-  Boggy + Congested. Mouth & Throat Lips: Upper Lip- Normal: no dryness, cracking, pallor, cyanosis, or vesicular eruption. Lower Lip-Normal: no dryness, cracking, pallor, cyanosis or vesicular eruption. Buccal Mucosa- Bilateral- No Aphthous ulcers. Oropharynx- No Discharge or Erythema. PND. Tonsils: Characteristics- Bilateral- No Erythema or Congestion. Size/Enlargement- Bilateral- No enlargement. Discharge- bilateral-None.  Neck Neck- Supple. No Masses.   Chest and Lung Exam Auscultation: Breath Sounds:-Normal.CTA  Cardiovascular Auscultation:Rythm- Regular, Rate and Rythm Murmurs & Other Heart Sounds:Ausculatation of the heart reveal- No Murmurs.  Lymphatic Head & Neck General Head & Neck Lymphatics: Bilateral: Description- No Localized lymphadenopathy.         Assessment & Plan:

## 2014-05-03 NOTE — Patient Instructions (Addendum)
I do think your dry chronic cough is related to allergies/post nasal drainage. We gave you depo-medrol im today and tussionex cough syrup. I will refer you to allergist as well.  Your rt tm does look mild red, if your ear starts to hurt then start azithromycin. Otherwise advised not to start.  Your blood pressure is elevated 3 time over last 3 visits. I will increase your bp medication today to hyzaar 100/12.5mg   Follow up in 3 wks or as needed.

## 2014-05-04 NOTE — Assessment & Plan Note (Signed)
I do think your dry chronic cough is related to allergies/post nasal drainage. We gave you depo-medrol im today and tussionex cough syrup. I will refer you to allergist as well.

## 2014-05-04 NOTE — Assessment & Plan Note (Signed)
Last 3 visits her bp has been high. Will go ahead and increase her bp med hyzaar from 50/12.5 q day to 100/12.5 q day.

## 2014-05-04 NOTE — Assessment & Plan Note (Signed)
Lilkely allergy related and will refer to allergist since failed various tx and xray was negative.

## 2014-05-16 ENCOUNTER — Other Ambulatory Visit: Payer: Self-pay

## 2014-05-29 ENCOUNTER — Telehealth: Payer: Self-pay | Admitting: Internal Medicine

## 2014-05-29 MED ORDER — LOSARTAN POTASSIUM-HCTZ 100-25 MG PO TABS: 1.0000 | ORAL_TABLET | Freq: Every day | ORAL | Status: DC

## 2014-05-29 NOTE — Telephone Encounter (Signed)
LMOM informing her of Dr. Larose Kells recommendations.

## 2014-05-29 NOTE — Telephone Encounter (Signed)
was seen with cough last month, recommend to try Mucinex DM, Tussionex is not a long-term solution. If she is not better needs a office visit or see  her allergist

## 2014-05-29 NOTE — Telephone Encounter (Signed)
Pt is requesting refill on cough medication: Tussionex Pennkinetic 10-8mg /70mL. Please advise.     Losartan refilled for 90 days.

## 2014-05-29 NOTE — Telephone Encounter (Signed)
Caller name: Corynn Relation to pt: self Call back number: [phone] [facility]: [facility] [address]  Reason for call:   Requesting a 90 day supply of losartan  Also, requesting another refill of the cough medication.

## 2014-06-27 ENCOUNTER — Institutional Professional Consult (permissible substitution): Payer: Managed Care, Other (non HMO) | Admitting: Internal Medicine

## 2014-07-17 LAB — HM DIABETES EYE EXAM

## 2014-07-20 ENCOUNTER — Ambulatory Visit (INDEPENDENT_AMBULATORY_CARE_PROVIDER_SITE_OTHER): Payer: Managed Care, Other (non HMO) | Admitting: Internal Medicine

## 2014-07-20 ENCOUNTER — Encounter: Payer: Self-pay | Admitting: Internal Medicine

## 2014-07-20 ENCOUNTER — Other Ambulatory Visit: Payer: Managed Care, Other (non HMO)

## 2014-07-20 VITALS — BP 120/78 | HR 82 | Ht 59.0 in | Wt 152.6 lb

## 2014-07-20 DIAGNOSIS — J302 Other seasonal allergic rhinitis: Secondary | ICD-10-CM

## 2014-07-20 DIAGNOSIS — Z91018 Allergy to other foods: Secondary | ICD-10-CM

## 2014-07-20 DIAGNOSIS — J45909 Unspecified asthma, uncomplicated: Secondary | ICD-10-CM

## 2014-07-20 DIAGNOSIS — J3089 Other allergic rhinitis: Principal | ICD-10-CM

## 2014-07-20 DIAGNOSIS — J4520 Mild intermittent asthma, uncomplicated: Secondary | ICD-10-CM

## 2014-07-20 DIAGNOSIS — J309 Allergic rhinitis, unspecified: Secondary | ICD-10-CM

## 2014-07-20 NOTE — Progress Notes (Signed)
07/20/14- 54  yoF never smoker, Dr Harvie Heck referral for allergy consult Originally from [place], in this country since 1980 on first arrival she had asthma which was worse when she moved to [place]. Sinus surgery in 1997. After moving here, allergist here around 2000-skin test positive and treated with allergy shots until she had large local reaction and stopped. Since then just using Claritin. Occasional sneezing and nocturnal cough but asthma has generally been well controlled with Ventolin and Qvar. Vacation in [place] 3 weeks last fall-caught a cold with persistent cough and runny nose. Now feels well, after antibiotic and cough medicine Seasonal-springtime sneeze and cough. Usual triggers-humidity, angry, pollens Food allergy-thinks shellfish make her throat itch. Home environment: Lives in apartment. Water leak was fixed. No mold. Dog indoors. No smokers. 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  Prior to Admission medications   Medication Sig Start Date End Date Taking? Authorizing Provider  albuterol (VENTOLIN HFA) 108 (90 BASE) MCG/ACT inhaler Inhale 2 puffs into the lungs every 6 (six) hours as needed for wheezing. 03/31/14 11/17/15 Yes Debbrah Alar, NP  aspirin 81 MG tablet Take 81 mg by mouth daily.     Yes Historical Provider, MD  beclomethasone (QVAR) 40 MCG/ACT inhaler Inhale 2 puffs into the lungs 2 (two) times daily. 04/06/14  Yes Meriam Sprague Saguier, PA-C  cetirizine (ZYRTEC) 10 MG chewable tablet Chew 10 mg by mouth daily.   Yes Historical Provider, MD  ERRIN 0.35 MG tablet Take 1 tablet by mouth daily. 06/09/12  Yes Historical Provider, MD  FINACEA 15 % cream  06/22/12  Yes Historical Provider, MD  fluticasone (FLONASE) 50 MCG/ACT nasal spray Place 2 sprays into the nose daily. 11/05/12 07/20/14 Yes Colon Branch, MD  ibuprofen (ADVIL,MOTRIN) 600 MG tablet Take 1 tablet (600 mg total) by mouth every 8 (eight) hours as needed. 10/17/13  Yes Dene Gentry, MD  loratadine  (CLARITIN) 10 MG tablet Take 10 mg by mouth daily.   Yes Historical Provider, MD  losartan-hydrochlorothiazide (HYZAAR) 100-25 MG per tablet Take 1 tablet by mouth daily. 05/29/14  Yes Colon Branch, MD  simvastatin (ZOCOR) 40 MG tablet Take 1 tablet (40 mg total) by mouth at bedtime. 04/19/14  Yes Colon Branch, MD   Past Medical History  Diagnosis Date  . Allergic rhinitis   . Hyperlipidemia   . History of exercise stress test 07-2010    normal except for elevated BP  . Diabetes mellitus 08/23/2010  . Asthma   . Menopause   . Rosacea    Past Surgical History  Procedure Laterality Date  . Eardrum surgery    . Nasal sinus surgery     Family History  Problem Relation Age of Onset  . Diabetes Mother   . Hyperlipidemia Mother   . Hypertension Mother   . Stroke Mother   . Heart attack Neg Hx   . Breast cancer Sister 29  . Stomach cancer Father   . Colon cancer Neg Hx    History   Social History  . Marital Status: Married    Spouse Name: N/A    Number of Children: 2  . Years of Education: N/A   Occupational History  . [employer]    Social History Main Topics  . Smoking status: Never Smoker   . Smokeless tobacco: Never Used  . Alcohol Use: Yes     Comment: red wine sometimes   . Drug Use: No  . Sexual Activity: Not on file  Other Topics Concern  . Not on file   Social History Narrative   Household pt, husband, 1 child    2 children, 71 and 23 y/o             ROS-see HPI Constitutional:   No-   weight loss, night sweats, fevers, chills, fatigue, lassitude. HEENT:   No-  headaches, difficulty swallowing, tooth/dental problems, sore throat,       No-  sneezing, itching, ear ache, +nasal congestion, +post nasal drip,  CV:  No-   chest pain, orthopnea, PND, swelling in lower extremities, anasarca,                                  dizziness, palpitations Resp: No-   shortness of breath with exertion or at rest.              No-   productive cough,  + non-productive cough,  No-  coughing up of blood.              No-   change in color of mucus.  No- wheezing.   Skin: No-   rash or lesions. GI:  No-   heartburn, indigestion, abdominal pain, nausea, vomiting, diarrhea,                 change in bowel habits, loss of appetite GU: No-   dysuria, change in color of urine, no urgency or frequency.  No- flank pain. MS:  No-   joint pain or swelling.  No- decreased range of motion.  No- back pain. Neuro-     nothing unusual Psych:  No- change in mood or affect. No depression or anxiety.  No memory loss.  OBJ- Physical Exam General- Alert, Oriented, Affect-appropriate, Distress- none acute Skin- rash-none, lesions- none, excoriation- none Lymphadenopathy- none Head- atraumatic            Eyes- Gross vision intact, PERRLA, conjunctivae and secretions clear            Ears- Hearing, canals-normal            Nose- Clear, no-Septal dev, mucus, polyps, erosion, perforation             Throat- Mallampati III , mucosa clear , drainage- none, tonsils- atrophic Neck- flexible , trachea midline, no stridor , thyroid nl, carotid no bruit Chest - symmetrical excursion , unlabored           Heart/CV- RRR , no murmur , no gallop  , no rub, nl s1 s2                           - JVD- none , edema- none, stasis changes- none, varices- none           Lung- clear to P&A, wheeze- none, cough- none , dullness-none, rub- none           Chest wall-  Abd- tender-no, distended-no, bowel sounds-present, HSM- no Br/ Gen/ Rectal- Not done, not indicated Extrem- cyanosis- none, clubbing, none, atrophy- none, strength- nl Neuro- grossly intact to observation

## 2014-07-20 NOTE — Patient Instructions (Signed)
Order- lab- Allergy profile, Food IgE profile, Seafood Allergy panel    Dx - Seasonal and perennial allergic rhinitis, Food allergy, Allergic asthma  As less sedating alternatives to benadryl- Claritin/ loratadine and Allegra/ fexofenadine  Best to avoid foods and exposures you know may trigger your allergies/ asthma

## 2014-07-21 LAB — ALLERGEN FOOD PROFILE SPECIFIC IGE
Apple: <0.1 kU/L
IgE (Immunoglobulin E), Serum: 127 kU/L — ABNORMAL HIGH (ref <115)
Peanut IgE: <0.1 kU/L
Shrimp IgE: <0.1 kU/L
Soybean IgE: <0.1 kU/L
Tuna IgE: <0.1 kU/L
Wheat IgE: <0.1 kU/L

## 2014-07-21 LAB — ALLERGY FULL PROFILE
Allergen, D pternoyssinus,d7: <0.1 kU/L
Allergen,Goose feathers, e70: <0.1 kU/L
BOX ELDER: 0.86 kU/L — AB
Bahia Grass: <0.1 kU/L
Bermuda Grass: <0.1 kU/L
Common Ragweed: 7.93 kU/L — ABNORMAL HIGH
Dog Dander: <0.1 kU/L
Fescue: <0.1 kU/L
G005 Rye, Perennial: <0.1 kU/L
GOLDENROD: 0.42 kU/L — AB
Helminthosporium halodes: <0.1 kU/L
Lamb's Quarters: <0.1 kU/L
Plantain: <0.1 kU/L
Stemphylium Botryosum: <0.1 kU/L
Timothy Grass: <0.1 kU/L

## 2014-07-21 LAB — ALLERGEN SEAFOOD PANEL
Crab: <0.1 kU/L
Crayfish: <0.1 kU/L
Scallop IgE: <0.1 kU/L
Shrimp IgE: <0.1 kU/L

## 2014-07-22 DIAGNOSIS — Z91018 Allergy to other foods: Secondary | ICD-10-CM | POA: Insufficient documentation

## 2014-07-22 NOTE — Assessment & Plan Note (Signed)
Discussed complexities of food allergy evaluation. Avoid foods which cause problems. Plan-food allergy profile, seafood profile

## 2014-07-22 NOTE — Assessment & Plan Note (Signed)
Well-controlled with Ventolin and Qvar. No sleep disturbance. Using rescue inhaler only every few weeks.

## 2014-07-22 NOTE — Assessment & Plan Note (Signed)
Initially she thought acute symptoms visiting [place] for an allergy but it sounds more as if she caught a cold while there. She now feels well. There is a history consistent with seasonal allergic rhinitis. Plan-allergy profiles. Consider therapeutic choices on return.

## 2014-07-24 ENCOUNTER — Telehealth: Payer: Self-pay | Admitting: *Deleted

## 2014-07-24 ENCOUNTER — Telehealth: Payer: Self-pay | Admitting: Internal Medicine

## 2014-07-24 NOTE — Telephone Encounter (Signed)
See prior phone note on patient.

## 2014-07-24 NOTE — Telephone Encounter (Signed)
lmomtcb for pt - will send msg to Katie to f/u on as msg was closed.

## 2014-07-24 NOTE — Telephone Encounter (Signed)
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

## 2014-07-24 NOTE — Telephone Encounter (Signed)
Pt called back-aware of recs from CY and will wait for April visit.

## 2014-07-24 NOTE — Telephone Encounter (Signed)
We will be able to treat with allergy shots. There will be time when she comes back in April to discuss this and other treatment for ragweed allergy at that visit.

## 2014-08-02 ENCOUNTER — Ambulatory Visit: Payer: Private Health Insurance - Indemnity | Admitting: Internal Medicine

## 2014-08-08 ENCOUNTER — Encounter: Payer: Self-pay | Admitting: Internal Medicine

## 2014-08-08 ENCOUNTER — Ambulatory Visit (INDEPENDENT_AMBULATORY_CARE_PROVIDER_SITE_OTHER): Payer: Managed Care, Other (non HMO) | Admitting: Internal Medicine

## 2014-08-08 ENCOUNTER — Other Ambulatory Visit (HOSPITAL_COMMUNITY): Payer: Self-pay | Admitting: Obstetrics & Gynecology

## 2014-08-08 VITALS — BP 122/79 | HR 86 | Temp 98.2°F | Ht 59.0 in | Wt 153.5 lb

## 2014-08-08 DIAGNOSIS — E119 Type 2 diabetes mellitus without complications: Secondary | ICD-10-CM

## 2014-08-08 DIAGNOSIS — Z1231 Encounter for screening mammogram for malignant neoplasm of breast: Secondary | ICD-10-CM

## 2014-08-08 DIAGNOSIS — I10 Essential (primary) hypertension: Secondary | ICD-10-CM

## 2014-08-08 LAB — BASIC METABOLIC PANEL
BUN: 11 mg/dL (ref 6–23)
CHLORIDE: 106 meq/L (ref 96–112)
CO2: 26 mEq/L (ref 19–32)
Calcium: 8.6 mg/dL (ref 8.4–10.5)
Creatinine, Ser: 0.71 mg/dL (ref 0.40–1.20)
GFR: 111.4 mL/min (ref >60.00)
GLUCOSE: 88 mg/dL (ref 70–99)
POTASSIUM: 3.7 meq/L (ref 3.5–5.1)
Sodium: 137 mEq/L (ref 135–145)

## 2014-08-08 LAB — HEMOGLOBIN A1C: HEMOGLOBIN A1C: 6.1 % (ref 4.6–6.5)

## 2014-08-08 NOTE — Progress Notes (Signed)
Pre visit review using our clinic review tool, if applicable. No additional management support is needed unless otherwise documented below in the visit note. 

## 2014-08-08 NOTE — Progress Notes (Signed)
Subjective:    Patient ID: Barbara Flores, female    DOB: Feb 28, 1961, 54 y.o.   MRN: [id number]  DOS:  08/08/2014 Type of visit - description : rov Interval history:  Was seen several months ago with abdominal pain, symptoms subsided. Hypertension, BP was elevated when she was seen for an acute problem 04-2014, losartan dose increased. Ambulatory BPs in the 120/70. Allergies, saw the specialist already, next visit by April 2016.    Review of Systems  denies chest pain, occasional shortness of breath related to asthma, good response to inhalers. No nausea, vomiting, diarrhea No dysuria or gross hematuria   Past Medical History  Diagnosis Date  . Allergic rhinitis   . Hyperlipidemia   . History of exercise stress test 07-2010    normal except for elevated BP  . Diabetes mellitus 08/23/2010  . Asthma   . Menopause   . Rosacea     Past Surgical History  Procedure Laterality Date  . Eardrum surgery    . Nasal sinus surgery      History   Social History  . Marital Status: Married    Spouse Name: N/A  . Number of Children: 2  . Years of Education: N/A   Occupational History  . [employer]    Social History Main Topics  . Smoking status: Never Smoker   . Smokeless tobacco: Never Used  . Alcohol Use: Yes     Comment: red wine sometimes   . Drug Use: No  . Sexual Activity: Not on file   Other Topics Concern  . Not on file   Social History Narrative   Household pt, husband, 1 child    2 children, 22 and 25 y/o                  Medication List       This list is accurate as of: 08/08/14 11:59 PM.  Always use your most recent med list.               albuterol 108 (90 BASE) MCG/ACT inhaler  Commonly known as:  VENTOLIN HFA  Inhale 2 puffs into the lungs every 6 (six) hours as needed for wheezing.     aspirin 81 MG tablet  Take 81 mg by mouth daily.     beclomethasone 40 MCG/ACT inhaler  Commonly known as:  QVAR  Inhale 2 puffs into the lungs 2 (two) times  daily.     ERRIN 0.35 MG tablet  Generic drug:  norethindrone  Take 1 tablet by mouth daily.     FINACEA 15 % cream  Generic drug:  Azelaic Acid     fluticasone 50 MCG/ACT nasal spray  Commonly known as:  FLONASE  Place 2 sprays into the nose daily.     ibuprofen 600 MG tablet  Commonly known as:  ADVIL,MOTRIN  Take 1 tablet (600 mg total) by mouth every 8 (eight) hours as needed.     loratadine 10 MG tablet  Commonly known as:  CLARITIN  Take 10 mg by mouth daily.     losartan-hydrochlorothiazide 100-25 MG per tablet  Commonly known as:  HYZAAR  Take 1 tablet by mouth daily.     simvastatin 40 MG tablet  Commonly known as:  ZOCOR  Take 1 tablet (40 mg total) by mouth at bedtime.           Objective:   Physical Exam BP 122/79 mmHg  Pulse 86  Temp(Src) 98.2 F (36.8 C) (  Oral)  Ht 4\' 11"  (1.499 m)  Wt 153 lb 8 oz (69.627 kg)  BMI 30.99 kg/m2  SpO2 99%  LMP 06/30/2014 (Approximate) General:   Well developed, well nourished . NAD.  HEENT:  Normocephalic . Face symmetric, atraumatic  Lungs:  CTA B Normal respiratory effort, no intercostal retractions, no accessory muscle use. Heart: RRR,  no murmur.  Muscle skeletal: no pretibial edema bilaterally  Skin: Not pale. Not jaundice Neurologic:  alert & oriented X3.  Speech normal, gait appropriate for age and unassisted Psych--  Cognition and judgment appear intact.  Cooperative with normal attention span and concentration.  Behavior appropriate. No anxious or depressed appearing.        Assessment & Plan:    Right flank pain, Ultrasound 02-2014 negative, symptoms essentially resolved.

## 2014-08-08 NOTE — Assessment & Plan Note (Signed)
Hyzaar dose increased recently, BP better, check a BMP, refill as needed

## 2014-08-08 NOTE — Patient Instructions (Signed)
Get your blood work before you leave    Come back by 8 -2016  for a  office visit physical exam   No need to come back fasting

## 2014-08-08 NOTE — Assessment & Plan Note (Signed)
Eye exam negative few days ago Labs

## 2014-08-09 ENCOUNTER — Telehealth: Payer: Self-pay | Admitting: Internal Medicine

## 2014-08-09 NOTE — Telephone Encounter (Signed)
Caller name:[name] Relationship to patient:Self Can be [phone] Pharmacy:[facility] [address] Reason for call: PT was seen yesterday and stating that she thinks she may have sinus infection- requesting zpack to be called in.

## 2014-08-09 NOTE — Telephone Encounter (Signed)
Please advise 

## 2014-08-09 NOTE — Telephone Encounter (Signed)
Recommend to continue with Flonase and Claritin. Also start taking Mucinex DM OTC Call in Astelin nasal spray 2 sprays in each side of the nose twice a day. Call a Rx w/ one refill If she's not improving in few days to let us know or OV

## 2014-08-10 MED ORDER — AZELASTINE HCL 0.1 % NA SOLN: 2.0000 | Freq: Two times a day (BID) | NASAL | Status: DC

## 2014-08-10 NOTE — Telephone Encounter (Signed)
LMOM informing Pt of Dr. Larose Kells recommendations. Astelin sent to [facility] as requested.

## 2014-09-18 ENCOUNTER — Encounter: Payer: Self-pay | Admitting: Internal Medicine

## 2014-09-18 ENCOUNTER — Ambulatory Visit (INDEPENDENT_AMBULATORY_CARE_PROVIDER_SITE_OTHER): Payer: Managed Care, Other (non HMO) | Admitting: Internal Medicine

## 2014-09-18 VITALS — BP 118/78 | HR 94 | Ht 59.0 in | Wt 151.6 lb

## 2014-09-18 DIAGNOSIS — J302 Other seasonal allergic rhinitis: Secondary | ICD-10-CM

## 2014-09-18 DIAGNOSIS — J3089 Other allergic rhinitis: Principal | ICD-10-CM

## 2014-09-18 DIAGNOSIS — J309 Allergic rhinitis, unspecified: Secondary | ICD-10-CM | POA: Diagnosis not present

## 2014-09-18 DIAGNOSIS — Z91018 Allergy to other foods: Secondary | ICD-10-CM

## 2014-09-18 MED ORDER — OLOPATADINE HCL 0.7 % OP SOLN: 1.0000 [drp] | Freq: Every day | OPHTHALMIC | Status: DC

## 2014-09-18 MED ORDER — METHYLPREDNISOLONE ACETATE 80 MG/ML IJ SUSP
80.0000 mg | Freq: Once | INTRAMUSCULAR | Status: AC
  Administered 2014-09-18: 80 mg via INTRAMUSCULAR

## 2014-09-18 NOTE — Patient Instructions (Signed)
Sample Pazeo eye drops if available   1 drop in each eye once daily for allergy  Depo 80  Generic Claritin is called loratadine Generic Allegra is fexofenadine  Please call as needed

## 2014-09-18 NOTE — Progress Notes (Signed)
07/20/14- 55  yoF never smoker, Dr Harvie Heck referral for allergy consult Originally from [place], in this country since 1980 on first arrival she had asthma which was worse when she moved to [place]. Sinus surgery in 1997. After moving here, allergist here around 2000-skin test positive and treated with allergy shots until she had large local reaction and stopped. Since then just using Claritin. Occasional sneezing and nocturnal cough but asthma has generally been well controlled with Ventolin and Qvar. Vacation in [place] 3 weeks last fall-caught a cold with persistent cough and runny nose. Now feels well, after antibiotic and cough medicine Seasonal-springtime sneeze and cough. Usual triggers-humidity, angry, pollens Food allergy-thinks shellfish make her throat itch. Home environment: Lives in apartment. Water leak was fixed. No mold. Dog indoors. No smokers. 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  09/18/14- 8 yoF from [place], followed for asthma/ allergy FOLLOWS FOR: Pt c/o itchy eyes, sinus pressure/pain, puffy watery eyes and sneezing. Using Astelin, Flonase and Claritin as directed. Denies fever. Allergy profiles 07/20/14 Total IgE 127, elevated Ragweed, no foods including seafood Onset in March of sneezing, itchy eyes and runny nose consistent with tree pollen season. Using Flonase and Claritin which helped but her eyes are too dry. Feels fine.  ROS-see HPI Constitutional:   No-   weight loss, night sweats, fevers, chills, fatigue, lassitude. HEENT:   No-  headaches, difficulty swallowing, tooth/dental problems, sore throat,       + sneezing, +itching, ear ache, +nasal congestion, +post nasal drip,  CV:  No-   chest pain, orthopnea, PND, swelling in lower extremities, anasarca,                                  dizziness, palpitations Resp: No-   shortness of breath with exertion or at rest.              No-   productive cough,  + non-productive cough,  No- coughing up of blood.           No-   change in color of mucus.  No- wheezing.   Skin: No-   rash or lesions. GI:  No-   heartburn, indigestion, abdominal pain, nausea, vomiting,  GU: . MS:  No-   joint pain or swelling.   Neuro-     nothing unusual Psych:  No- change in mood or affect. No depression or anxiety.  No memory loss.  OBJ- Physical Exam General- Alert, Oriented, Affect-appropriate, Distress- none acute Skin- rash-none, lesions- none, excoriation- none Lymphadenopathy- none Head- atraumatic            Eyes- Gross vision intact, PERRLA, conjunctivae and secretions clear            Ears- Hearing, canals-normal            Nose- Clear, no-Septal dev, mucus, polyps, erosion, perforation             Throat- Mallampati III , mucosa clear , drainage- none, tonsils- atrophic Neck- flexible , trachea midline, no stridor , thyroid nl, carotid no bruit Chest - symmetrical excursion , unlabored           Heart/CV- RRR , no murmur , no gallop  , no rub, nl s1 s2                           - JVD- none ,  edema- none, stasis changes- none, varices- none           Lung- clear to P&A, wheeze- none, cough- none , dullness-none, rub- none           Chest wall-  Abd- Br/ Gen/ Rectal- Not done, not indicated Extrem- cyanosis- none, clubbing, none, atrophy- none, strength- nl Neuro- grossly intact to observation

## 2014-09-28 ENCOUNTER — Ambulatory Visit (HOSPITAL_COMMUNITY)
Admission: RE | Admit: 2014-09-28 | Discharge: 2014-09-28 | Disposition: A | Discharge status: Home / Self Care | Payer: Managed Care, Other (non HMO) | Source: Ambulatory Visit | Attending: Obstetrics & Gynecology | Admitting: Obstetrics & Gynecology

## 2014-09-28 DIAGNOSIS — Z1231 Encounter for screening mammogram for malignant neoplasm of breast: Secondary | ICD-10-CM | POA: Diagnosis present

## 2014-09-30 ENCOUNTER — Other Ambulatory Visit: Payer: Self-pay | Admitting: Internal Medicine

## 2014-10-04 ENCOUNTER — Telehealth: Payer: Self-pay | Admitting: Internal Medicine

## 2014-10-04 MED ORDER — OLOPATADINE HCL 0.7 % OP SOLN: 1.0000 [drp] | Freq: Every day | OPHTHALMIC | Status: DC

## 2014-10-04 NOTE — Telephone Encounter (Signed)
Rx has been sent in. Pt is aware. Nothing further was needed. 

## 2014-10-08 NOTE — Assessment & Plan Note (Signed)
Symptoms now are quite consistent with seasonal allergic rhinitis associated with tree pollens despite lack of specific elevation on the allergy profile. Plan-Depo-Medrol, sample Pazeo eye drops for trial

## 2014-10-08 NOTE — Assessment & Plan Note (Addendum)
Despite lack of elevation on food IgE panels, she is advised to avoid foods which cause trouble and to pay attention. Consider keeping a food diary.

## 2014-10-09 ENCOUNTER — Encounter: Payer: Self-pay | Admitting: Internal Medicine

## 2014-10-09 ENCOUNTER — Ambulatory Visit (INDEPENDENT_AMBULATORY_CARE_PROVIDER_SITE_OTHER): Payer: Managed Care, Other (non HMO) | Admitting: Internal Medicine

## 2014-10-09 VITALS — BP 122/62 | HR 78 | Temp 97.8°F | Ht 59.0 in | Wt 149.0 lb

## 2014-10-09 DIAGNOSIS — J069 Acute upper respiratory infection, unspecified: Secondary | ICD-10-CM

## 2014-10-09 DIAGNOSIS — J020 Streptococcal pharyngitis: Secondary | ICD-10-CM

## 2014-10-09 DIAGNOSIS — Z0000 Encounter for general adult medical examination without abnormal findings: Secondary | ICD-10-CM

## 2014-10-09 LAB — POCT RAPID STREP A (OFFICE): Rapid Strep A Screen: NEGATIVE

## 2014-10-09 NOTE — Progress Notes (Signed)
Pre visit review using our clinic review tool, if applicable. No additional management support is needed unless otherwise documented below in the visit note. 

## 2014-10-09 NOTE — Patient Instructions (Signed)
Rest, fluids , tylenol If  cough, take Mucinex DM twice a day as needed  Continue all your allergy medications, albuterol as needed for wheezing. Call if not gradually better over the next  10 days Call anytime if the symptoms are severe

## 2014-10-09 NOTE — Assessment & Plan Note (Signed)
Reports she saw her gynecologist for a Pap smear few weeks ago,  had a mammogram

## 2014-10-09 NOTE — Progress Notes (Signed)
Subjective:    Patient ID: Barbara Flores, female    DOB: [birth year], 54 y.o.   MRN: [id number]  DOS:  10/09/2014 Type of visit - description : acute Interval history: Symptoms started 3 days ago with a scratchy throat and cough, the next day she felt somewhat achy. She's taking Mucinex and that is helping.   Review of Systems  No fever, mild chills? No nausea, vomiting, diarrhea. History of asthma, symptoms not exacerbated, denies current wheezing or sputum production. + Mild headache.  Past Medical History  Diagnosis Date  . Allergic rhinitis   . Hyperlipidemia   . History of exercise stress test 07-2010    normal except for elevated BP  . Diabetes mellitus 08/23/2010  . Asthma   . Menopause   . Rosacea     Past Surgical History  Procedure Laterality Date  . Eardrum surgery    . Nasal sinus surgery      History   Social History  . Marital Status: Married    Spouse Name: N/A  . Number of Children: 2  . Years of Education: N/A   Occupational History  . [employer]    Social History Main Topics  . Smoking status: Never Smoker   . Smokeless tobacco: Never Used  . Alcohol Use: Yes     Comment: red wine sometimes   . Drug Use: No  . Sexual Activity: Not on file   Other Topics Concern  . Not on file   Social History Narrative   Household pt, husband, 1 child    2 children, 89 and 16 y/o                  Medication List       This list is accurate as of: 10/09/14  6:36 PM.  Always use your most recent med list.               albuterol 108 (90 BASE) MCG/ACT inhaler  Commonly known as:  VENTOLIN HFA  Inhale 2 puffs into the lungs every 6 (six) hours as needed for wheezing.     aspirin 81 MG tablet  Take 81 mg by mouth daily.     azelastine 0.1 % nasal spray  Commonly known as:  ASTELIN  Place 2 sprays into both nostrils 2 (two) times daily.     beclomethasone 40 MCG/ACT inhaler  Commonly known as:  QVAR  Inhale 2 puffs into the lungs 2 (two)  times daily.     ERRIN 0.35 MG tablet  Generic drug:  norethindrone  Take 1 tablet by mouth daily.     FINACEA 15 % cream  Generic drug:  Azelaic Acid     fluticasone 50 MCG/ACT nasal spray  Commonly known as:  FLONASE  Place 2 sprays into the nose daily.     ibuprofen 600 MG tablet  Commonly known as:  ADVIL,MOTRIN  Take 1 tablet (600 mg total) by mouth every 8 (eight) hours as needed.     loratadine 10 MG tablet  Commonly known as:  CLARITIN  Take 10 mg by mouth daily.     losartan-hydrochlorothiazide 100-25 MG per tablet  Commonly known as:  HYZAAR  Take 1 tablet by mouth daily.     Olopatadine HCl 0.7 % Soln  Commonly known as:  PAZEO  Apply 1 drop to eye daily.     simvastatin 40 MG tablet  Commonly known as:  ZOCOR  Take 1 tablet (40 mg total) by  mouth at bedtime.           Objective:   Physical Exam BP 122/62 mmHg  Pulse 78  Temp(Src) 97.8 F (36.6 C) (Oral)  Ht 4\' 11"  (1.499 m)  Wt 149 lb (67.586 kg)  BMI 30.08 kg/m2  SpO2 97%  LMP 08/05/2014 General:   Well developed, well nourished . NAD.  HEENT:  Normocephalic . Face symmetric, atraumatic, sinuses not TTP TMs normal, throat symmetric, no red, nose is slightly congested. Lungs:  CTA B Normal respiratory effort, no intercostal retractions, no accessory muscle use. Heart: RRR,  no murmur.  Muscle skeletal: no pretibial edema bilaterally  Skin: Not pale. Not jaundice Neurologic:  alert & oriented X3.  Speech normal, gait appropriate for age and unassisted Psych--  Cognition and judgment appear intact.  Cooperative with normal attention span and concentration.  Behavior appropriate. No anxious or depressed appearing.        Assessment & Plan:    URI Strep a (-) Plan: Conservative treatment, see instructions

## 2014-11-14 ENCOUNTER — Other Ambulatory Visit: Payer: Self-pay

## 2014-11-14 ENCOUNTER — Telehealth: Payer: Self-pay | Admitting: Internal Medicine

## 2014-11-14 MED ORDER — LOSARTAN POTASSIUM-HCTZ 100-25 MG PO TABS: 1.0000 | ORAL_TABLET | Freq: Every day | ORAL | Status: DC

## 2014-11-14 NOTE — Telephone Encounter (Signed)
Caller name: Clyde Relation to pt: self Call back number: [phone] Pharmacy: [facility] on [address]  Reason for call:   Requesting 90 day supply on losartan

## 2014-11-14 NOTE — Telephone Encounter (Signed)
Losartan sent to [facility] on [address] as requested.

## 2015-01-18 ENCOUNTER — Ambulatory Visit: Payer: Managed Care, Other (non HMO) | Admitting: Internal Medicine

## 2015-02-05 ENCOUNTER — Telehealth: Payer: Self-pay | Admitting: *Deleted

## 2015-02-05 ENCOUNTER — Encounter: Payer: Self-pay | Admitting: *Deleted

## 2015-02-05 NOTE — Telephone Encounter (Signed)
Pre-Visit Call completed with patient and chart updated.   Pre-Visit Info documented in Specialty Comments under Snap[employer]ot.    

## 2015-02-06 ENCOUNTER — Ambulatory Visit (INDEPENDENT_AMBULATORY_CARE_PROVIDER_SITE_OTHER): Payer: Managed Care, Other (non HMO) | Admitting: Internal Medicine

## 2015-02-06 ENCOUNTER — Ambulatory Visit (HOSPITAL_BASED_OUTPATIENT_CLINIC_OR_DEPARTMENT_OTHER): Payer: Managed Care, Other (non HMO)

## 2015-02-06 ENCOUNTER — Encounter: Payer: Self-pay | Admitting: Internal Medicine

## 2015-02-06 VITALS — BP 118/68 | HR 85 | Temp 98.4°F | Ht 59.0 in | Wt 160.1 lb

## 2015-02-06 DIAGNOSIS — E119 Type 2 diabetes mellitus without complications: Secondary | ICD-10-CM

## 2015-02-06 DIAGNOSIS — R1031 Right lower quadrant pain: Secondary | ICD-10-CM

## 2015-02-06 DIAGNOSIS — Z23 Encounter for immunization: Secondary | ICD-10-CM

## 2015-02-06 DIAGNOSIS — Z0000 Encounter for general adult medical examination without abnormal findings: Secondary | ICD-10-CM

## 2015-02-06 LAB — CBC WITH DIFFERENTIAL/PLATELET
BASOS ABS: 0 10*3/uL (ref 0.0–0.1)
Basophils Relative: 0.3 % (ref 0.0–3.0)
Eosinophils Absolute: 0.2 10*3/uL (ref 0.0–0.7)
Eosinophils Relative: 1.8 % (ref 0.0–5.0)
HCT: 38.7 % (ref 36.0–46.0)
Hemoglobin: 13.3 g/dL (ref 12.0–15.0)
LYMPHS ABS: 2.2 10*3/uL (ref 0.7–4.0)
LYMPHS PCT: 20.9 % (ref 12.0–46.0)
MCHC: 34.4 g/dL (ref 30.0–36.0)
MCV: 90.1 fl (ref 78.0–100.0)
Monocytes Absolute: 0.7 10*3/uL (ref 0.1–1.0)
Monocytes Relative: 6.5 % (ref 3.0–12.0)
NEUTROS PCT: 70.5 % (ref 43.0–77.0)
Neutro Abs: 7.4 10*3/uL (ref 1.4–7.7)
Platelets: 262 10*3/uL (ref 150.0–400.0)
RBC: 4.3 Mil/uL (ref 3.87–5.11)
RDW: 13.8 % (ref 11.5–15.5)
WBC: 10.4 10*3/uL (ref 4.0–10.5)

## 2015-02-06 LAB — BASIC METABOLIC PANEL
BUN: 12 mg/dL (ref 6–23)
CHLORIDE: 106 meq/L (ref 96–112)
CO2: 23 mEq/L (ref 19–32)
CREATININE: 0.59 mg/dL (ref 0.40–1.20)
Calcium: 8.5 mg/dL (ref 8.4–10.5)
GFR: 137.67 mL/min (ref >60.00)
Glucose, Bld: 105 mg/dL — ABNORMAL HIGH (ref 70–99)
POTASSIUM: 3.5 meq/L (ref 3.5–5.1)
SODIUM: 136 meq/L (ref 135–145)

## 2015-02-06 LAB — HEPATITIS C ANTIBODY: HCV Ab: NEGATIVE

## 2015-02-06 LAB — URINALYSIS, ROUTINE W REFLEX MICROSCOPIC
Bilirubin Urine: NEGATIVE
KETONES UR: NEGATIVE
Leukocytes, UA: NEGATIVE
Nitrite: NEGATIVE
PH: 6.5 (ref 5.0–8.0)
RBC / HPF: NONE SEEN (ref 0–?)
SPECIFIC GRAVITY, URINE: 1.01 (ref 1.000–1.030)
Total Protein, Urine: NEGATIVE
URINE GLUCOSE: NEGATIVE
UROBILINOGEN UA: 0.2 (ref 0.0–1.0)
WBC, UA: NONE SEEN (ref 0–?)

## 2015-02-06 LAB — LIPID PANEL
CHOL/HDL RATIO: 3
Cholesterol: 148 mg/dL (ref 0–200)
HDL: 50.6 mg/dL (ref >39.00)
LDL Cholesterol: 77 mg/dL (ref 0–99)
NonHDL: 97.53
TRIGLYCERIDES: 104 mg/dL (ref 0.0–149.0)
VLDL: 20.8 mg/dL (ref 0.0–40.0)

## 2015-02-06 LAB — TSH: TSH: 1.98 u[IU]/mL (ref 0.35–4.50)

## 2015-02-06 LAB — AST: AST: 17 U/L (ref 0–37)

## 2015-02-06 LAB — ALT: ALT: 18 U/L (ref 0–35)

## 2015-02-06 LAB — HEMOGLOBIN A1C: Hgb A1c MFr Bld: 6.2 % (ref 4.6–6.5)

## 2015-02-06 NOTE — Progress Notes (Signed)
Pre visit review using our clinic review tool, if applicable. No additional management support is needed unless otherwise documented below in the visit note. 

## 2015-02-06 NOTE — Progress Notes (Signed)
Subjective:    Patient ID: Barbara Flores, female    DOB: 1960/12/15, 54 y.o.   MRN: [id number]  DOS:  02/06/2015 Type of visit - description : CPX Interval history: The patient recently saw Dr. Lisbeth Renshaw with menopausal symptoms, increased appetite and weight gain. She gets emotional sometimes. She was prescribed hormones.  Wt Readings from Last 3 Encounters:  02/06/15 160 lb 2 oz (72.632 kg)  10/09/14 149 lb (67.586 kg)  09/18/14 151 lb 9.6 oz (68.765 kg)    Review of Systems Several months history of right sided mid and  upper abdominal pain, on and off, approximately one episode a week, not associated dysuria, gross hematuria. Not associated with food intake. Saw Dr. Sheppard Evens, a urinalysis was negative according to the patient  Constitutional: No fever. No chills. No unexplained wt changes. No unusual sweats  HEENT: No dental problems, no ear discharge, no facial swelling, no voice changes. No eye discharge, no eye  redness , no  intolerance to light   Respiratory: No wheezing , no  difficulty breathing. No cough , no mucus production  Cardiovascular: No CP, no leg swelling , no  Palpitations  GI: no nausea, no vomiting, no diarrhea .  No blood in the stools. No dysphagia, no odynophagia    Endocrine: No polyphagia, no polyuria , no polydipsia  GU: No dysuria, gross hematuria, difficulty urinating. No urinary urgency, no frequency.  Musculoskeletal: No joint swellings or unusual aches or pains  Skin: No change in the color of the skin, palor , no  Rash  Allergic, immunologic: No environmental allergies , no  food allergies  Neurological: No dizziness no  syncope. No headaches. No diplopia, no slurred, no slurred speech, no motor deficits, no facial  Numbness  Hematological: No enlarged lymph nodes, no easy bruising , no unusual bleedings  Psychiatry: No suicidal ideas, no hallucinations, no beavior problems, no confusion.  No unusual/severe anxiety, no depression   Past  Medical History  Diagnosis Date  . Allergic rhinitis   . Hyperlipidemia   . History of exercise stress test 07-2010    normal except for elevated BP  . Diabetes mellitus 08/23/2010  . Asthma   . Menopause   . Rosacea     Past Surgical History  Procedure Laterality Date  . Eardrum surgery    . Nasal sinus surgery      Social History   Social History  . Marital Status: Married    Spouse Name: N/A  . Number of Children: 2  . Years of Education: N/A   Occupational History  . [employer]    Social History Main Topics  . Smoking status: Never Smoker   . Smokeless tobacco: Never Used  . Alcohol Use: Yes     Comment: red wine sometimes   . Drug Use: No  . Sexual Activity: Not on file   Other Topics Concern  . Not on file   Social History Narrative   Household pt, husband, 2 child    2 children, 61 and 6 y/o               Family History  Problem Relation Age of Onset  . Diabetes Mother   . Hyperlipidemia Mother   . Hypertension Mother   . Stroke Mother   . Heart attack Neg Hx   . Breast cancer Sister 86  . Stomach cancer Father   . Colon cancer Neg Hx        Medication List  This list is accurate as of: 02/06/15  1:02 PM.  Always use your most recent med list.               albuterol 108 (90 BASE) MCG/ACT inhaler  Commonly known as:  VENTOLIN HFA  Inhale 2 puffs into the lungs every 6 (six) hours as needed for wheezing.     aspirin 81 MG tablet  Take 81 mg by mouth daily.     beclomethasone 40 MCG/ACT inhaler  Commonly known as:  QVAR  Inhale 2 puffs into the lungs 2 (two) times daily.     ERRIN 0.35 MG tablet  Generic drug:  norethindrone  Take 1 tablet by mouth daily.     FINACEA 15 % cream  Generic drug:  Azelaic Acid     ibuprofen 600 MG tablet  Commonly known as:  ADVIL,MOTRIN  Take 1 tablet (600 mg total) by mouth every 8 (eight) hours as needed.     loratadine 10 MG tablet  Commonly known as:  CLARITIN  Take 10 mg by mouth  daily.     losartan-hydrochlorothiazide 100-25 MG per tablet  Commonly known as:  HYZAAR  Take 1 tablet by mouth daily.     simvastatin 40 MG tablet  Commonly known as:  ZOCOR  Take 1 tablet (40 mg total) by mouth at bedtime.           Objective:   Physical Exam BP 118/68 mmHg  Pulse 85  Temp(Src) 98.4 F (36.9 C) (Oral)  Ht 4\' 11"  (1.499 m)  Wt 160 lb 2 oz (72.632 kg)  BMI 32.32 kg/m2  SpO2 98%  LMP 12/07/2014 (Approximate) General:   Well developed, well nourished . NAD.  Neck:  Full range of motion. Supple. No  Thyromegaly  HEENT:  Normocephalic . Face symmetric, atraumatic Lungs:  CTA B Normal respiratory effort, no intercostal retractions, no accessory muscle use. Heart: RRR,  no murmur.  No pretibial edema bilaterally  Abdomen:  Not distended, soft, non-tender. No rebound or rigidity. No mass,organomegaly Skin: Exposed areas without rash. Not pale. Not jaundice Neurologic:  alert & oriented X3.  Speech normal, gait appropriate for age and unassisted Strength symmetric and appropriate for age.  Psych: Cognition and judgment appear intact.  Cooperative with normal attention span and concentration.  Behavior appropriate. No anxious or depressed appearing.    Assessment & Plan:   Hypertension, diabetes, asthma: Good compliance of medication, reports ambulatory BPs are very good, no recent ambulatory CBGs Will check appropriate labs  R sided abdominal pain: Check a UA and a ultrasound  Weight gain: Counseled

## 2015-02-06 NOTE — Patient Instructions (Signed)
Get your blood work before you leave    

## 2015-02-06 NOTE — Assessment & Plan Note (Addendum)
Td 2012; pnm shot 2015, prevnar today LGS:PJSUN had a colonoscopy, likes to proceed with a colonoscopy this year after tripping October. She will let me know when ready  for a referral Female care per gynecologist    Saw gyn this year, Dr Lisbeth Renshaw,  had a pap and mmg labs: Hep C, HIV, BMP, AST, ALT, FLP, CBC, A1c, TSH Diet -- exercise: Discussed

## 2015-02-07 LAB — HIV ANTIBODY (ROUTINE TESTING W REFLEX): HIV 1&2 Ab, 4th Generation: NONREACTIVE

## 2015-03-08 ENCOUNTER — Ambulatory Visit (INDEPENDENT_AMBULATORY_CARE_PROVIDER_SITE_OTHER): Payer: Managed Care, Other (non HMO) | Admitting: Internal Medicine

## 2015-03-08 ENCOUNTER — Encounter: Payer: Self-pay | Admitting: Internal Medicine

## 2015-03-08 VITALS — BP 126/78 | HR 75 | Temp 97.7°F | Ht 59.0 in | Wt 159.5 lb

## 2015-03-08 DIAGNOSIS — J069 Acute upper respiratory infection, unspecified: Secondary | ICD-10-CM

## 2015-03-08 DIAGNOSIS — R1011 Right upper quadrant pain: Secondary | ICD-10-CM

## 2015-03-08 DIAGNOSIS — Z09 Encounter for follow-up examination after completed treatment for conditions other than malignant neoplasm: Secondary | ICD-10-CM | POA: Insufficient documentation

## 2015-03-08 MED ORDER — OMEPRAZOLE 40 MG PO CPDR: 40.0000 mg | DELAYED_RELEASE_CAPSULE | Freq: Every day | ORAL | Status: DC

## 2015-03-08 NOTE — Patient Instructions (Signed)
We'll try to get your ultrasound scheduled as soon as possible  If you have severe abdominal pain, fever, chills: Go to the ER  Start taking omeprazole 1 tablet before breakfast every day. His will help with heartburn.  For your cough : Take over-the-counter medications such as Robitussin-DM. Call me if not improving soon  Please schedule a visit to see me by 2- 2017.

## 2015-03-08 NOTE — Assessment & Plan Note (Signed)
Right upper quadrant pain: Likely a gallbladder issue, a Korea was prescribed last month but never happen. Plan: Korea as soon as possible, if negative will pursue a HIDA scan URI: Conservative treatment discussed.

## 2015-03-08 NOTE — Progress Notes (Signed)
Subjective:    Patient ID: Barbara Flores, female    DOB: [birth year], 54 y.o.   MRN: [id number]  DOS:  03/08/2015 Type of visit - description : Acute Interval history: Continue w/ on and off right upper quadrant abdominal pain, episodes last a few minutes, happening few times a week, not necessarily postprandial. Also for the last few days has noted mild cough and sore throat. She has been exposed to URIs at work.   Review of Systems Denies fever chills Reports some heartburn. Feeling bloated. Occasional nausea No vomiting, diarrhea or blood in the stools.   Past Medical History  Diagnosis Date  . Allergic rhinitis   . Hyperlipidemia   . History of exercise stress test 07-2010    normal except for elevated BP  . Diabetes mellitus 08/23/2010  . Asthma   . Menopause   . Rosacea     Past Surgical History  Procedure Laterality Date  . Eardrum surgery    . Nasal sinus surgery      Social History   Social History  . Marital Status: Married    Spouse Name: N/A  . Number of Children: 2  . Years of Education: N/A   Occupational History  . [employer]    Social History Main Topics  . Smoking status: Never Smoker   . Smokeless tobacco: Never Used  . Alcohol Use: Yes     Comment: red wine sometimes   . Drug Use: No  . Sexual Activity: Not on file   Other Topics Concern  . Not on file   Social History Narrative   Household pt, husband, 2 child    2 children, 72 and 46 y/o                  Medication List       This list is accurate as of: 03/08/15  8:54 PM.  Always use your most recent med list.               albuterol 108 (90 BASE) MCG/ACT inhaler  Commonly known as:  VENTOLIN HFA  Inhale 2 puffs into the lungs every 6 (six) hours as needed for wheezing.     aspirin 81 MG tablet  Take 81 mg by mouth daily.     beclomethasone 40 MCG/ACT inhaler  Commonly known as:  QVAR  Inhale 2 puffs into the lungs 2 (two) times daily.     ERRIN 0.35 MG tablet    Generic drug:  norethindrone  Take 1 tablet by mouth daily.     FINACEA 15 % cream  Generic drug:  Azelaic Acid     ibuprofen 600 MG tablet  Commonly known as:  ADVIL,MOTRIN  Take 1 tablet (600 mg total) by mouth every 8 (eight) hours as needed.     loratadine 10 MG tablet  Commonly known as:  CLARITIN  Take 10 mg by mouth daily.     losartan-hydrochlorothiazide 100-25 MG per tablet  Commonly known as:  HYZAAR  Take 1 tablet by mouth daily.     omeprazole 40 MG capsule  Commonly known as:  PRILOSEC  Take 1 capsule (40 mg total) by mouth daily.     simvastatin 40 MG tablet  Commonly known as:  ZOCOR  Take 1 tablet (40 mg total) by mouth at bedtime.           Objective:   Physical Exam BP 126/78 mmHg  Pulse 75  Temp(Src) 97.7 F (36.5 C) (Oral)  Ht 4\' 11"  (1.499 m)  Wt 159 lb 8 oz (72.349 kg)  BMI 32.20 kg/m2  SpO2 96%  LMP 12/07/2014 (Approximate) General:   Well developed, well nourished . NAD.  HEENT:  Normocephalic . Face symmetric, atraumatic. TMs normal, throat symmetric, nose is slightly congested. No jaundice Lungs:  CTA B Normal respiratory effort, no intercostal retractions, no accessory muscle use. Heart: RRR,  no murmur.  no pretibial edema bilaterally  Abdomen:  Not distended, soft, slightly tender at the right upper quadrant without mass or rebound Skin: Not pale. Not jaundice Neurologic:  alert & oriented X3.  Speech normal, gait appropriate for age and unassisted Psych--  Cognition and judgment appear intact.  Cooperative with normal attention span and concentration.  Behavior appropriate. No anxious or depressed appearing.    Assessment & Plan:   Plan Right upper quadrant pain: Likely a gallbladder issue, a [place] was prescribed last month but never happen. Plan: Korea as soon as possible, if negative will pursue a HIDA scan URI: Conservative treatment discussed.

## 2015-03-08 NOTE — Progress Notes (Signed)
Pre visit review using our clinic review tool, if applicable. No additional management support is needed unless otherwise documented below in the visit note. 

## 2015-03-15 ENCOUNTER — Ambulatory Visit (HOSPITAL_BASED_OUTPATIENT_CLINIC_OR_DEPARTMENT_OTHER)
Admission: RE | Admit: 2015-03-15 | Discharge: 2015-03-15 | Disposition: A | Discharge status: Home / Self Care | Payer: Managed Care, Other (non HMO) | Source: Ambulatory Visit | Attending: Internal Medicine | Admitting: Internal Medicine

## 2015-03-15 DIAGNOSIS — K769 Liver disease, unspecified: Secondary | ICD-10-CM | POA: Diagnosis not present

## 2015-03-15 DIAGNOSIS — R1011 Right upper quadrant pain: Secondary | ICD-10-CM | POA: Diagnosis not present

## 2015-03-16 ENCOUNTER — Telehealth: Payer: Self-pay | Admitting: Internal Medicine

## 2015-03-16 DIAGNOSIS — R1011 Right upper quadrant pain: Secondary | ICD-10-CM

## 2015-03-16 NOTE — Telephone Encounter (Signed)
HIDA scan ordered 

## 2015-03-16 NOTE — Telephone Encounter (Signed)
Ultrasound showed no gallbladder stone. She has a fatty liver. Plan: schedule a HIDA scan, dx right upper quadrant abdominal pain If test is negative, consider a referral to GI (per Korea report, given somewhat atypical fatty liver may need a MRI of that area)

## 2015-03-16 NOTE — Telephone Encounter (Signed)
LMOM informing Pt of ultrasound results. Instructed her to limit fat in diet which may help with fatty liver. Informed her that Dr. Larose Kells would like to have a HIDA scan performed, informed her that I would place orders and she should receive a call to schedule HIDA scan sometime next week. Informed her if HIDA normal, consider GI referral and possible MRI. Instructed Pt to call if she has any further questions or concerns.

## 2015-03-16 NOTE — Telephone Encounter (Signed)
Please advise 

## 2015-03-16 NOTE — Telephone Encounter (Signed)
Relation to IF:[facility] Call back number: [phone]   Reason for call:  Patient inquiring about u/s results

## 2015-03-19 NOTE — Telephone Encounter (Signed)
HIDA scan scheduled on 03/21/2015 at 0700.

## 2015-03-21 ENCOUNTER — Ambulatory Visit (HOSPITAL_COMMUNITY)
Admission: RE | Admit: 2015-03-21 | Discharge: 2015-03-21 | Disposition: A | Discharge status: Home / Self Care | Payer: Managed Care, Other (non HMO) | Source: Ambulatory Visit | Attending: Internal Medicine | Admitting: Internal Medicine

## 2015-03-21 DIAGNOSIS — R110 Nausea: Secondary | ICD-10-CM | POA: Diagnosis not present

## 2015-03-21 DIAGNOSIS — R142 Eructation: Secondary | ICD-10-CM | POA: Insufficient documentation

## 2015-03-21 DIAGNOSIS — R1011 Right upper quadrant pain: Secondary | ICD-10-CM | POA: Insufficient documentation

## 2015-03-21 MED ORDER — SINCALIDE 5 MCG IJ SOLR
INTRAMUSCULAR | Status: AC
  Filled 2015-03-21: qty 5

## 2015-03-21 MED ORDER — TECHNETIUM TC 99M MEBROFENIN IV KIT
5.0000 | PACK | Freq: Once | INTRAVENOUS | Status: DC | PRN
  Administered 2015-03-21: 5 via INTRAVENOUS
  Filled 2015-03-21: qty 6

## 2015-03-21 MED ORDER — STERILE WATER FOR INJECTION IJ SOLN
INTRAMUSCULAR | Status: AC
  Filled 2015-03-21: qty 10

## 2015-03-21 MED ORDER — SINCALIDE 5 MCG IJ SOLR
0.0200 ug/kg | Freq: Once | INTRAMUSCULAR | Status: AC
  Administered 2015-03-21: 08:00:00 via INTRAVENOUS

## 2015-03-21 NOTE — Addendum Note (Signed)
Addended by: Wilfrid Lund on: 03/21/2015 04:47 PM   Modules accepted: Orders

## 2015-03-22 ENCOUNTER — Encounter: Payer: Self-pay | Admitting: Gastroenterology

## 2015-03-24 ENCOUNTER — Other Ambulatory Visit: Payer: Self-pay | Admitting: Internal Medicine

## 2015-04-27 ENCOUNTER — Encounter: Payer: Self-pay | Admitting: Internal Medicine

## 2015-04-27 ENCOUNTER — Ambulatory Visit (INDEPENDENT_AMBULATORY_CARE_PROVIDER_SITE_OTHER): Payer: Managed Care, Other (non HMO) | Admitting: Internal Medicine

## 2015-04-27 VITALS — BP 116/78 | HR 99 | Temp 97.9°F | Ht 59.0 in | Wt 156.2 lb

## 2015-04-27 DIAGNOSIS — Z09 Encounter for follow-up examination after completed treatment for conditions other than malignant neoplasm: Secondary | ICD-10-CM

## 2015-04-27 DIAGNOSIS — J069 Acute upper respiratory infection, unspecified: Secondary | ICD-10-CM | POA: Diagnosis not present

## 2015-04-27 DIAGNOSIS — J4520 Mild intermittent asthma, uncomplicated: Secondary | ICD-10-CM

## 2015-04-27 DIAGNOSIS — R1011 Right upper quadrant pain: Secondary | ICD-10-CM | POA: Diagnosis not present

## 2015-04-27 MED ORDER — BECLOMETHASONE DIPROPIONATE 40 MCG/ACT IN AERS: 2.0000 | INHALATION_SPRAY | Freq: Two times a day (BID) | RESPIRATORY_TRACT | Status: DC

## 2015-04-27 MED ORDER — ALBUTEROL SULFATE HFA 108 (90 BASE) MCG/ACT IN AERS: 2.0000 | INHALATION_SPRAY | Freq: Four times a day (QID) | RESPIRATORY_TRACT | Status: DC | PRN

## 2015-04-27 NOTE — Progress Notes (Signed)
Pre visit review using our clinic review tool, if applicable. No additional management support is needed unless otherwise documented below in the visit note. 

## 2015-04-27 NOTE — Patient Instructions (Signed)
Rest, fluids , tylenol  For cough: Take Mucinex DM twice a day as needed until better  Continue Flonase  Okay to take Claritin-D for a few days only. It may raise her blood pressure.  Use the inhalers if you have wheezing   Call if not gradually better over the next  10 days  Call anytime if the symptoms are severe, you have high fever, short of breath, chest pain  Next visit by February 2017

## 2015-04-27 NOTE — Progress Notes (Signed)
Subjective:    Patient ID: Barbara Flores, female    DOB: Mar 28, 1961, 54 y.o.   MRN: [id number]  DOS:  04/27/2015 Type of visit - description : Acute Interval history: Symptoms started 4 days ago, the day before she came back from [place]: Cough, sinus congestion. Taking Claritin-D, DayQuil and Flonase. States that previously DayQuil caused some HAs but is helping with the cough History of right upper quadrant abdominal pain: Much improved.   Review of Systems  No fever, some chills. Mild generalized aches. No shortness of breath. Some nausea, no vomiting or diarrhea. Currently with no wheezing Past Medical History  Diagnosis Date  . Allergic rhinitis   . Hyperlipidemia   . History of exercise stress test 07-2010    normal except for elevated BP  . Diabetes mellitus 08/23/2010  . Asthma   . Menopause   . Rosacea     Past Surgical History  Procedure Laterality Date  . Eardrum surgery    . Nasal sinus surgery      Social History   Social History  . Marital Status: Married    Spouse Name: N/A  . Number of Children: 2  . Years of Education: N/A   Occupational History  . [employer]    Social History Main Topics  . Smoking status: Never Smoker   . Smokeless tobacco: Never Used  . Alcohol Use: Yes     Comment: red wine sometimes   . Drug Use: No  . Sexual Activity: Not on file   Other Topics Concern  . Not on file   Social History Narrative   Household pt, husband, 2 child    2 children, 12 and 52 y/o                  Medication List       This list is accurate as of: 04/27/15 11:59 PM.  Always use your most recent med list.               albuterol 108 (90 BASE) MCG/ACT inhaler  Commonly known as:  VENTOLIN HFA  Inhale 2 puffs into the lungs every 6 (six) hours as needed for wheezing.     aspirin 81 MG tablet  Take 81 mg by mouth daily.     beclomethasone 40 MCG/ACT inhaler  Commonly known as:  QVAR  Inhale 2 puffs into the lungs 2 (two) times  daily.     ERRIN 0.35 MG tablet  Generic drug:  norethindrone  Take 1 tablet by mouth daily.     FINACEA 15 % cream  Generic drug:  Azelaic Acid     ibuprofen 600 MG tablet  Commonly known as:  ADVIL,MOTRIN  Take 1 tablet (600 mg total) by mouth every 8 (eight) hours as needed.     loratadine 10 MG tablet  Commonly known as:  CLARITIN  Take 10 mg by mouth daily.     losartan-hydrochlorothiazide 100-25 MG tablet  Commonly known as:  HYZAAR  Take 1 tablet by mouth daily.     omeprazole 40 MG capsule  Commonly known as:  PRILOSEC  Take 1 capsule (40 mg total) by mouth daily.     simvastatin 40 MG tablet  Commonly known as:  ZOCOR  Take 1 tablet (40 mg total) by mouth at bedtime.           Objective:   Physical Exam BP 116/78 mmHg  Pulse 99  Temp(Src) 97.9 F (36.6 C) (Oral)  Ht  4\' 11"  (1.499 m)  Wt 156 lb 4 oz (70.875 kg)  BMI 31.54 kg/m2  SpO2 96% General:   Well developed, well nourished . NAD.  HEENT:  Normocephalic . Face symmetric, atraumatic. TMs with some scar tissue but no red, bulge. Nose is slightly congested, sinuses not TTP Lungs:  CTA B Normal respiratory effort, no intercostal retractions, no accessory muscle use. Heart: RRR,  no murmur.  No pretibial edema bilaterally  Skin: Not pale. Not jaundice Neurologic:  alert & oriented X3.  Speech normal, gait appropriate for age and unassisted Psych--  Cognition and judgment appear intact.  Cooperative with normal attention span and concentration.  Behavior appropriate. No anxious or depressed appearing.      Assessment & Plan:    Assessment>  Diabetes 2012 Dyslipidemia Asthma Menopause Rosacea RUQ pain: Ultrasound and HIDA scan negative (02-2015, 03-2015) Stress test 2012 neg , elevated BP   PLAN URI: We'll recommend conservative treatment, discontinue DayQuil as it is causing some headaches. See instructions. If not better consider a Z-Pak. RUQ pain: Improved, has already an  appointment to see GI. Workup was negative

## 2015-04-29 NOTE — Assessment & Plan Note (Signed)
URI: We'll recommend conservative treatment, discontinue DayQuil as it is causing some headaches. See instructions. If not better consider a Z-Pak. RUQ pain: Improved, has already an appointment to see GI. Workup was negative

## 2015-05-18 ENCOUNTER — Ambulatory Visit (INDEPENDENT_AMBULATORY_CARE_PROVIDER_SITE_OTHER): Payer: Managed Care, Other (non HMO) | Admitting: Gastroenterology

## 2015-05-18 ENCOUNTER — Encounter: Payer: Self-pay | Admitting: Gastroenterology

## 2015-05-18 ENCOUNTER — Other Ambulatory Visit: Payer: Managed Care, Other (non HMO)

## 2015-05-18 VITALS — BP 154/86 | HR 96 | Ht 59.0 in | Wt 158.2 lb

## 2015-05-18 DIAGNOSIS — R938 Abnormal findings on diagnostic imaging of other specified body structures: Secondary | ICD-10-CM | POA: Diagnosis not present

## 2015-05-18 DIAGNOSIS — R1011 Right upper quadrant pain: Secondary | ICD-10-CM

## 2015-05-18 DIAGNOSIS — Z1211 Encounter for screening for malignant neoplasm of colon: Secondary | ICD-10-CM | POA: Diagnosis not present

## 2015-05-18 DIAGNOSIS — R9389 Abnormal findings on diagnostic imaging of other specified body structures: Secondary | ICD-10-CM

## 2015-05-18 NOTE — Progress Notes (Signed)
Barbara Flores    [id number]    Jan 26, 1961  Primary Care Physician:[name], MD  Referring Physician: Colon Branch, MD [address] [place], [place] [address]  Chief complaint:  RUQ abdominal pain HPI: 54 year old female here for new patient visit with complaints of intermittent right upper quadrant abdominal pain. She and had right upper quadrant ultrasound which showed fatty liver with focal fat sparing in the gallbladder fossa. HIDA scan was normal. LFTs are normal. She drinks one glass of red wine a day. Denies any herbal supplements or over-the-counter medications. She is taking oral contraceptive pill for past 2 years for perimenopausal symptoms. Denies any nausea, vomiting, constipation, diarrhea, blood in stool.    Outpatient Encounter Prescriptions as of 05/18/2015  Medication Sig  . albuterol (VENTOLIN HFA) 108 (90 BASE) MCG/ACT inhaler Inhale 2 puffs into the lungs every 6 (six) hours as needed for wheezing.  Marland Kitchen aspirin 81 MG tablet Take 81 mg by mouth daily.    . beclomethasone (QVAR) 40 MCG/ACT inhaler Inhale 2 puffs into the lungs 2 (two) times daily. (Patient taking differently: Inhale 2 puffs into the lungs as needed. )  . ERRIN 0.35 MG tablet Take 1 tablet by mouth daily.  Marland Kitchen FINACEA 15 % cream   . ibuprofen (ADVIL,MOTRIN) 600 MG tablet Take 1 tablet (600 mg total) by mouth every 8 (eight) hours as needed.  . loratadine (CLARITIN) 10 MG tablet Take 10 mg by mouth as needed.   Marland Kitchen losartan-hydrochlorothiazide (HYZAAR) 100-25 MG per tablet Take 1 tablet by mouth daily.  Marland Kitchen omeprazole (PRILOSEC) 40 MG capsule Take 1 capsule (40 mg total) by mouth daily.  . simvastatin (ZOCOR) 40 MG tablet Take 1 tablet (40 mg total) by mouth at bedtime. (Patient taking differently: Take 40 mg by mouth as needed. )   No facility-administered encounter medications on file as of 05/18/2015.    Allergies as of 05/18/2015 - Review Complete 05/18/2015  Allergen Reaction Noted    . Hydrocodone Nausea Only 10/09/2014  . Penicillins  07/23/2010  . Amoxicillin Rash 03/31/2014    Past Medical History  Diagnosis Date  . Allergic rhinitis   . Hyperlipidemia   . History of exercise stress test 07-2010    normal except for elevated BP  . Diabetes mellitus 08/23/2010  . Asthma   . Menopause   . Rosacea   . GERD (gastroesophageal reflux disease)     Past Surgical History  Procedure Laterality Date  . Eardrum surgery    . Nasal sinus surgery    . Hemorrhoid surgery    . Other surgical history      female surgery after miscarriage, ? D&C or edometrial surgery    Family History  Problem Relation Age of Onset  . Diabetes Mother   . Hyperlipidemia Mother   . Hypertension Mother   . Stroke Mother   . Heart attack Neg Hx   . Breast cancer Sister 32    deceased  . Stomach cancer Father     deceased  . Colon cancer Neg Hx     Social History   Social History  . Marital Status: Married    Spouse Name: N/A  . Number of Children: 2  . Years of Education: N/A   Occupational History  . [employer]    Social History Main Topics  . Smoking status: Never Smoker   . Smokeless tobacco: Never Used  . Alcohol Use:  0.0 oz/week    0 Standard drinks or equivalent per week     Comment: red wine glass per day  . Drug Use: No  . Sexual Activity: Not on file   Other Topics Concern  . Not on file   Social History Narrative   Household pt, husband, 2 child    2 children, 30 and 46 y/o                Review of systems: Review of Systems  Constitutional: Negative for fever and chills.  HENT: Negative.   Eyes: Negative for blurred vision.  Respiratory: Negative for cough, shortness of breath and wheezing.   Cardiovascular: Negative for chest pain and palpitations.  Gastrointestinal: as per HPI Genitourinary: Negative for dysuria, urgency, frequency and hematuria.  Musculoskeletal: Negative for myalgias, back pain and joint pain.  Skin: Negative for itching  and rash.  Neurological: Negative for dizziness, tremors, focal weakness, seizures and loss of consciousness.  Endo/Heme/Allergies: Negative for environmental allergies.  Psychiatric/Behavioral: Negative for depression, suicidal ideas and hallucinations.  All other systems reviewed and are negative.   Physical Exam: Filed Vitals:   05/18/15 1453  BP: 154/86  Pulse: 96   Gen:      No acute distress HEENT:  EOMI, sclera anicteric Neck:     No masses; no thyromegaly Lungs:    Clear to auscultation bilaterally; normal respiratory effort CV:         Regular rate and rhythm; no murmurs Abd:      + bowel sounds; soft, non-tender; no palpable masses, no distension Ext:    No edema; adequate peripheral perfusion Skin:      Warm and dry; no rash Neuro: alert and oriented x 3 Psych: normal mood and affect  Data Reviewed:  Abdominal ultrasound 02/2015 Liver shows increased echogenicity with probable fatty sparing near the gallbladder fossa region. This area of decreased attenuation near the gallbladder fossa is better defined compared to the previous study but was appreciable on the previous study. If there remains concern for potential mass near the gallbladder fossa within the liver, CT or MR pre and post-contrast would be advisable to further assess. Note that given underlying fatty change, subtle liver lesions could be obscured, and the sensitivity of ultrasound for focal liver lesions is diminished.  Portions of pancreas are obscured by gas. Visualized portions appear normal.  Study otherwise unremarkable.   Assessment and Plan/Recommendations:  53 year old female with complaints of intermittent right upper quadrant pain Given findings on right upper quadrant ultrasound with the above focal fat sparing in gallbladder, will schedule for MRI liver with and without contrast for better evaluation. She could possibly have liver adenomas in the setting of estrogen therapy Advised  patient to stop drinking alcohol Also check for hepatitis b serology She hasn't had screening colonoscopy yet, we'll schedule for it. Return after MRI liver  [name] , MD [phone] Mon-Fri 8a-5p [phone] after 5p, weekends, holidays

## 2015-05-18 NOTE — Patient Instructions (Addendum)
You have been scheduled for an MRI at [facility] radiology on 06/03/2015. Your appointment time is  8am. Please arrive 15 minutes prior to your appointment time for registration purposes. Please make certain not to have anything to eat or drink 6 hours prior to your test. In addition, if you have any metal in your body, have a pacemaker or defibrillator, please be sure to let your ordering physician know. This test typically takes 45 minutes to 1 hour to complete.  Go to the basement for labs today Your follow up appointment is scheduled on 07-10-2015 at 9am   It has been recommended to you by your physician that you have a(n) Colonoscopy completed. Per your request, we did not schedule the procedure(s) today. Please contact our office at [phone] should you decide to have the procedure completed.

## 2015-05-19 LAB — HEPATITIS A ANTIBODY, TOTAL: Hep A Total Ab: REACTIVE — AB

## 2015-05-19 LAB — HEPATITIS B SURFACE ANTIBODY,QUALITATIVE: HEP B S AB: POSITIVE — AB

## 2015-05-19 LAB — HEPATITIS B SURFACE ANTIGEN: HEP B S AG: NEGATIVE

## 2015-05-21 NOTE — Addendum Note (Signed)
Addended by: Oda Kilts on: 05/21/2015 08:50 AM   Modules accepted: Orders

## 2015-05-31 ENCOUNTER — Ambulatory Visit: Payer: Managed Care, Other (non HMO) | Admitting: Internal Medicine

## 2015-06-04 ENCOUNTER — Ambulatory Visit (HOSPITAL_COMMUNITY)
Admission: RE | Admit: 2015-06-04 | Discharge: 2015-06-04 | Disposition: A | Discharge status: Home / Self Care | Payer: Managed Care, Other (non HMO) | Source: Ambulatory Visit | Attending: Gastroenterology | Admitting: Gastroenterology

## 2015-06-04 DIAGNOSIS — Z1211 Encounter for screening for malignant neoplasm of colon: Secondary | ICD-10-CM | POA: Insufficient documentation

## 2015-06-04 DIAGNOSIS — K7689 Other specified diseases of liver: Secondary | ICD-10-CM | POA: Diagnosis not present

## 2015-06-04 DIAGNOSIS — R938 Abnormal findings on diagnostic imaging of other specified body structures: Secondary | ICD-10-CM | POA: Diagnosis present

## 2015-06-04 DIAGNOSIS — K760 Fatty (change of) liver, not elsewhere classified: Secondary | ICD-10-CM | POA: Insufficient documentation

## 2015-06-04 DIAGNOSIS — R1011 Right upper quadrant pain: Secondary | ICD-10-CM | POA: Insufficient documentation

## 2015-06-04 DIAGNOSIS — R9389 Abnormal findings on diagnostic imaging of other specified body structures: Secondary | ICD-10-CM

## 2015-06-04 LAB — POCT I-STAT CREATININE: Creatinine, Ser: 0.6 mg/dL (ref 0.44–1.00)

## 2015-06-04 MED ORDER — GADOBENATE DIMEGLUMINE 529 MG/ML IV SOLN
15.0000 mL | Freq: Once | INTRAVENOUS | Status: AC | PRN
  Administered 2015-06-04: 15 mL via INTRAVENOUS

## 2015-06-06 ENCOUNTER — Other Ambulatory Visit: Payer: Self-pay

## 2015-07-03 ENCOUNTER — Other Ambulatory Visit: Payer: Self-pay | Admitting: Internal Medicine

## 2015-07-10 ENCOUNTER — Ambulatory Visit (INDEPENDENT_AMBULATORY_CARE_PROVIDER_SITE_OTHER): Payer: Managed Care, Other (non HMO) | Admitting: Gastroenterology

## 2015-07-10 ENCOUNTER — Encounter: Payer: Self-pay | Admitting: Gastroenterology

## 2015-07-10 VITALS — BP 136/78 | HR 96 | Ht 59.0 in | Wt 153.0 lb

## 2015-07-10 DIAGNOSIS — K760 Fatty (change of) liver, not elsewhere classified: Secondary | ICD-10-CM

## 2015-07-10 DIAGNOSIS — Z1211 Encounter for screening for malignant neoplasm of colon: Secondary | ICD-10-CM | POA: Diagnosis not present

## 2015-07-10 MED ORDER — NA SULFATE-K SULFATE-MG SULF 17.5-3.13-1.6 GM/177ML PO SOLN: 1.0000 | Freq: Once | ORAL | Status: DC

## 2015-07-10 NOTE — Patient Instructions (Signed)
You have been scheduled for a colonoscopy. Please follow written instructions given to you at your visit today.  Please pick up your prep supplies at the pharmacy within the next 1-3 days. If you use inhalers (even only as needed), please bring them with you on the day of your procedure. Your physician has requested that you go to www.startemmi.com and enter the access code given to you at your visit today. This web site gives a general overview about your procedure. However, you should still follow specific instructions given to you by our office regarding your preparation for the procedure.  Follow up in 6 months

## 2015-07-10 NOTE — Progress Notes (Signed)
Barbara Flores    [id number]    11/04/1960  Primary Care Physician:[name], MD  Referring Physician: Colon Branch, MD [address] [place], [place] [address]  Chief complaint: Fatty liver HPI: 55 year old female here for follow-up visit.  She was last seen in office visit in December 2016 for abnormal right upper quadrant ultrasound which showed fatty liver with focal fat sparing in the gallbladder fossa. HIDA scan was normal. LFTs are normal. Denied any herbal supplements or over-the-counter medications. She was drinking one glass of red wine a day and she was taking oral contraceptive pill for past 2 years for perimenopausal symptoms which she has since stopped. Denies any nausea, vomiting, constipation, diarrhea, blood in stool. She has no specific complaints today and feels overall well.   Outpatient Encounter Prescriptions as of 07/10/2015  Medication Sig  . albuterol (VENTOLIN HFA) 108 (90 BASE) MCG/ACT inhaler Inhale 2 puffs into the lungs every 6 (six) hours as needed for wheezing.  Marland Kitchen aspirin 81 MG tablet Take 81 mg by mouth daily.    . beclomethasone (QVAR) 40 MCG/ACT inhaler Inhale 2 puffs into the lungs 2 (two) times daily. (Patient taking differently: Inhale 2 puffs into the lungs as needed. )  . FINACEA 15 % cream   . ibuprofen (ADVIL,MOTRIN) 600 MG tablet Take 1 tablet (600 mg total) by mouth every 8 (eight) hours as needed.  . loratadine (CLARITIN) 10 MG tablet Take 10 mg by mouth as needed.   Marland Kitchen losartan-hydrochlorothiazide (HYZAAR) 100-25 MG tablet Take 1 tablet by mouth daily.  Marland Kitchen omeprazole (PRILOSEC) 40 MG capsule Take 1 capsule (40 mg total) by mouth daily.  . simvastatin (ZOCOR) 40 MG tablet Take 1 tablet (40 mg total) by mouth at bedtime. (Patient taking differently: Take 40 mg by mouth as needed. )  . [DISCONTINUED] ERRIN 0.35 MG tablet Take 1 tablet by mouth daily.   No facility-administered encounter medications on file as of 07/10/2015.     Allergies as of 07/10/2015 - Review Complete 07/10/2015  Allergen Reaction Noted  . Hydrocodone Nausea Only 10/09/2014  . Penicillins  07/23/2010  . Amoxicillin Rash 03/31/2014    Past Medical History  Diagnosis Date  . Allergic rhinitis   . Hyperlipidemia   . History of exercise stress test 07-2010    normal except for elevated BP  . Diabetes mellitus 08/23/2010  . Asthma   . Menopause   . Rosacea   . GERD (gastroesophageal reflux disease)     Past Surgical History  Procedure Laterality Date  . Eardrum surgery    . Nasal sinus surgery    . Hemorrhoid surgery    . Other surgical history      female surgery after miscarriage, ? D&C or edometrial surgery    Family History  Problem Relation Age of Onset  . Diabetes Mother   . Hyperlipidemia Mother   . Hypertension Mother   . Stroke Mother   . Heart attack Neg Hx   . Breast cancer Sister 27    deceased  . Stomach cancer Father     deceased  . Colon cancer Neg Hx     Social History   Social History  . Marital Status: Married    Spouse Name: N/A  . Number of Children: 2  . Years of Education: N/A   Occupational History  . [employer]    Social History Main Topics  . Smoking status:  Never Smoker   . Smokeless tobacco: Never Used  . Alcohol Use: 0.0 oz/week    0 Standard drinks or equivalent per week     Comment: red wine glass per day  . Drug Use: No  . Sexual Activity: Not on file   Other Topics Concern  . Not on file   Social History Narrative   Household pt, husband, 2 child    2 children, 9 and 83 y/o                Review of systems: Review of Systems  Constitutional: Negative for fever and chills.  HENT: Negative.   Eyes: Negative for blurred vision.  Respiratory: Negative for cough, shortness of breath and wheezing.   Cardiovascular: Negative for chest pain and palpitations.  Gastrointestinal: as per HPI Genitourinary: Negative for dysuria, urgency, frequency and hematuria.   Musculoskeletal: Negative for myalgias, back pain and joint pain.  Skin: Negative for itching and rash.  Neurological: Negative for dizziness, tremors, focal weakness, seizures and loss of consciousness.  Endo/Heme/Allergies: Negative for environmental allergies.  Psychiatric/Behavioral: Negative for depression, suicidal ideas and hallucinations.  All other systems reviewed and are negative.   Physical Exam: Filed Vitals:   07/10/15 0906  BP: 136/78  Pulse: 96   Gen:      No acute distress HEENT:  EOMI, sclera anicteric Neck:     No masses; no thyromegaly Lungs:    Clear to auscultation bilaterally; normal respiratory effort CV:         Regular rate and rhythm; no murmurs Abd:      + bowel sounds; soft, non-tender; no palpable masses, no distension Ext:    No edema; adequate peripheral perfusion Skin:      Warm and dry; no rash Neuro: alert and oriented x 3 Psych: normal mood and affect  Data Reviewed:  HIDA 03/2015 Normal  MRI 05/2015 1. Diffuse fatty infiltration of the liver with focal fatty sparing near the gallbladder fossa. 2. Smaller focus of focal fatty sparing versus small vascular shunt in the left hepatic lobe as discussed above. 3. Small cysts in the left hepatic lobe. 4. No acute abdominal findings, mass lesions or adenopathy.   Assessment and Plan/Recommendations:  55 year old female with fatty infiltration of the liver, no LFT abnormality. She is off oral contraceptive pills and is abstaining from alcohol MRI abdomen is not concerning for any mass lesions Advised patient to continue to abstain from alcohol and also discussed diet and exercise to lose weight in setting of metabolic syndrome She is due for screening colonoscopy, will schedule it I spent 30 minutes managing her care and counseling Return in 6 months  K. Denzil Magnuson , MD [phone] Mon-Fri 8a-5p [phone] after 5p, weekends, holidays

## 2015-08-01 ENCOUNTER — Telehealth: Payer: Self-pay | Admitting: Gastroenterology

## 2015-08-01 NOTE — Telephone Encounter (Signed)
Returned patient's call and she states that she drank the first prep early today.  She also states that she ate chicken noodle soup unstrained.  I advised her that she should not have drank prep before time instructed and that she should not have had chicken noodle soup.  I also told her that she should follow the instructions given to her and to drink her second prep at the designated time on instructions.  She agreed to do so and to look at the clear liquid list again and follow it.  All questions were answered and she will arrive at her scheduled time tomorrow.

## 2015-08-02 ENCOUNTER — Ambulatory Visit (AMBULATORY_SURGERY_CENTER): Payer: Managed Care, Other (non HMO) | Admitting: Gastroenterology

## 2015-08-02 ENCOUNTER — Encounter: Payer: Self-pay | Admitting: Gastroenterology

## 2015-08-02 VITALS — BP 137/79 | HR 74 | Temp 96.9°F | Resp 18 | Ht 59.0 in | Wt 153.0 lb

## 2015-08-02 DIAGNOSIS — Z1211 Encounter for screening for malignant neoplasm of colon: Secondary | ICD-10-CM

## 2015-08-02 MED ORDER — SODIUM CHLORIDE 0.9 % IV SOLN: 500.0000 mL | INTRAVENOUS | Status: DC

## 2015-08-02 NOTE — Patient Instructions (Signed)
Impressions/recommendations:  Hemorrhoids (handout given) Repeat colonoscopy in 10 years.  YOU HAD AN ENDOSCOPIC PROCEDURE TODAY AT THE [facility]:   Refer to the procedure report that was given to you for any specific questions about what was found during the examination.  If the procedure report does not answer your questions, please call your gastroenterologist to clarify.  If you requested that your care partner not be given the details of your procedure findings, then the procedure report has been included in a sealed envelope for you to review at your convenience later.  YOU SHOULD EXPECT: Some feelings of bloating in the abdomen. Passage of more gas than usual.  Walking can help get rid of the air that was put into your GI tract during the procedure and reduce the bloating. If you had a lower endoscopy (such as a colonoscopy or flexible sigmoidoscopy) you may notice spotting of blood in your stool or on the toilet paper. If you underwent a bowel prep for your procedure, you may not have a normal bowel movement for a few days.  Please Note:  You might notice some irritation and congestion in your nose or some drainage.  This is from the oxygen used during your procedure.  There is no need for concern and it should clear up in a day or so.  SYMPTOMS TO REPORT IMMEDIATELY:   Following lower endoscopy (colonoscopy or flexible sigmoidoscopy):  Excessive amounts of blood in the stool  Significant tenderness or worsening of abdominal pains  Swelling of the abdomen that is new, acute  Fever of 100F or higher  For urgent or emergent issues, a gastroenterologist can be reached at any hour by calling [phone].   DIET: Your first meal following the procedure should be a small meal and then it is ok to progress to your normal diet. Heavy or fried foods are harder to digest and may make you feel nauseous or bloated.  Likewise, meals heavy in dairy and vegetables can increase  bloating.  Drink plenty of fluids but you should avoid alcoholic beverages for 24 hours.  ACTIVITY:  You should plan to take it easy for the rest of today and you should NOT DRIVE or use heavy machinery until tomorrow (because of the sedation medicines used during the test).    FOLLOW UP: Our staff will call the number listed on your records the next business day following your procedure to check on you and address any questions or concerns that you may have regarding the information given to you following your procedure. If we do not reach you, we will leave a message.  However, if you are feeling well and you are not experiencing any problems, there is no need to return our call.  We will assume that you have returned to your regular daily activities without incident.  If any biopsies were taken you will be contacted by phone or by letter within the next 1-3 weeks.  Please call us at [phone] if you have not heard about the biopsies in 3 weeks.    SIGNATURES/CONFIDENTIALITY: You and/or your care partner have signed paperwork which will be entered into your electronic medical record.  These signatures attest to the fact that that the information above on your After Visit Summary has been reviewed and is understood.  Full responsibility of the confidentiality of this discharge information lies with you and/or your care-partner. 

## 2015-08-02 NOTE — Progress Notes (Signed)
Starleen Arms CRNA made aware that pt finished a bottle of water at 1000

## 2015-08-02 NOTE — Progress Notes (Signed)
Patient awakening,vss,report to rn 

## 2015-08-02 NOTE — Op Note (Signed)
[place]  [address]. [place], [address]   COLONOSCOPY PROCEDURE REPORT  PATIENT: Barbara Flores, Barbara Flores  MR#: [id number] BIRTHDATE: [birth year] , 41  yrs. old GENDER: female ENDOSCOPIST: Harl Bowie, MD REFERRED [id number] Larose Kells, M.D. PROCEDURE DATE:  08/02/2015 PROCEDURE:   Colonoscopy, screening First Screening Colonoscopy - Avg.  risk and is 50 yrs.  old or older Yes.  Prior Negative Screening - Now for repeat screening. N/A  History of Adenoma - Now for follow-up colonoscopy & has been > or = to 3 yrs.  N/A  Polyps removed today? No Recommend repeat exam, <10 yrs? No ASA CLASS:   Class II INDICATIONS:Screening for colonic neoplasia and Colorectal Neoplasm Risk Assessment for this procedure is average risk. MEDICATIONS: Propofol 250 mg IV  DESCRIPTION OF PROCEDURE:   After the risks benefits and alternatives of the procedure were thoroughly explained, informed consent was obtained.  The digital rectal exam revealed no abnormalities of the rectum and revealed external hemorrhoids. The LB [id number]  endoscope was introduced through the anus and advanced to the terminal ileum which was intubated for a short distance. No adverse events experienced.   The quality of the prep was good.  The instrument was then slowly withdrawn as the colon was fully examined. Estimated blood loss is zero unless otherwise noted in this procedure report.   COLON FINDINGS: The examined terminal ileum appeared to be normal. A normal appearing cecum, ileocecal valve, and appendiceal orifice were identified.  The ascending, transverse, descending, sigmoid colon, and rectum appeared unremarkable.   Small internal hemorrhoids were found.   Small external hemorrhoids were found. Retroflexed views revealed internal hemorrhoids. The time to cecum = 2.9 Withdrawal time = 6.8   The scope was withdrawn and the procedure completed. COMPLICATIONS: There were no immediate  complications.  ENDOSCOPIC IMPRESSION: 1.   The examined terminal ileum appeared to be normal 2.   Normal colonoscopy 3.   Small internal hemorrhoids 4.   Small external hemorrhoids  RECOMMENDATIONS: You should continue to follow colorectal cancer screening guidelines for "routine risk" patients with a repeat colonoscopy in 10 years. There is no need for FOBT (stool) testing for at least 5 years.  eSigned:  Harl Bowie, MD 08/02/2015 12:15 PM

## 2015-08-03 ENCOUNTER — Telehealth: Payer: Self-pay

## 2015-08-03 NOTE — Telephone Encounter (Signed)
  Follow up Call-  Call back number 08/02/2015  Post procedure Call Back phone  # [phone]  Permission to leave phone message Yes     Patient questions:  Do you have a fever, pain , or abdominal swelling? No. Pain Score  0 *  Have you tolerated food without any problems? Yes.    Have you been able to return to your normal activities? Yes.    Do you have any questions about your discharge instructions: Diet   No. Medications  No. Follow up visit  No.  Do you have questions or concerns about your Care? No.  Actions: * If pain score is 4 or above: No action needed, pain <4.

## 2015-08-09 ENCOUNTER — Ambulatory Visit: Payer: Managed Care, Other (non HMO) | Admitting: Internal Medicine

## 2015-08-27 ENCOUNTER — Encounter: Payer: Self-pay | Admitting: Family Medicine

## 2015-08-27 ENCOUNTER — Ambulatory Visit (INDEPENDENT_AMBULATORY_CARE_PROVIDER_SITE_OTHER): Payer: Managed Care, Other (non HMO) | Admitting: Family Medicine

## 2015-08-27 ENCOUNTER — Ambulatory Visit (HOSPITAL_BASED_OUTPATIENT_CLINIC_OR_DEPARTMENT_OTHER)
Admission: RE | Admit: 2015-08-27 | Discharge: 2015-08-27 | Disposition: A | Discharge status: Home / Self Care | Payer: Managed Care, Other (non HMO) | Source: Ambulatory Visit | Attending: Family Medicine | Admitting: Family Medicine

## 2015-08-27 VITALS — BP 132/66 | HR 80 | Temp 97.5°F | Ht 59.0 in | Wt 152.0 lb

## 2015-08-27 DIAGNOSIS — K0889 Other specified disorders of teeth and supporting structures: Secondary | ICD-10-CM | POA: Diagnosis not present

## 2015-08-27 NOTE — Progress Notes (Signed)
[place] at [facility] [address] [place], [place] [address] [phone]  Date:  08/27/2015   Name:  Barbara Flores   DOB:  December 11, 1960   MRN:  [id number]  PCP:  Kathlene November, MD    Chief Complaint: Sinus Problem   History of Present Illness:  Barbara Flores is a 55 y.o. very pleasant female patient who presents with the following:  She is here today with illness- she has noted pain in her right ear and tenderness in her teeth. She has noted symptoms for about 2 weeks now, worse over the last 4 days. Her sx started with pain in her right upper teeth.    She saw her dentist 2 weeks ago and was treated with abx for 7 days.  She is not sure of what she took.  She took it for 7 days. She was then referral to an ?oral surgeon to discuss root canal but was told that she did not need this procedure   She has noted a mild cough and pain right ear pain only No fever that she has noticed Intermittent ST No GI symptoms.  She does have sinus pressure and pain, hurts when she bends forward  Her LMP was 3 weeks ago- her menses are spacing out as she is in perimenopause  Lab Results  Component Value Date   HGBA1C 6.2 02/06/2015   History of well controlled DM diet only Patient Active Problem List   Diagnosis Date Noted  . PCP NOTES >>>>>>>>>>>>>> 03/08/2015  . Food allergy 07/22/2014  . Cough 03/31/2014  . Pain in joint, lower leg 10/18/2013  . Asthma, mild intermittent 12/29/2011  . Menopause 08/01/2011  . Annual physical exam 12/23/2010  . Essential hypertension, benign 08/23/2010  . Diabetes ([place]) 08/23/2010  . HYPERLIPIDEMIA 07/23/2010  . Seasonal and perennial allergic rhinitis 07/23/2010    Past Medical History  Diagnosis Date  . Allergic rhinitis   . Hyperlipidemia   . History of exercise stress test 07-2010    normal except for elevated BP  . Diabetes mellitus 08/23/2010  . Asthma   . Menopause   . Rosacea   . GERD  (gastroesophageal reflux disease)   . Allergy   . Hypertension     Past Surgical History  Procedure Laterality Date  . Eardrum surgery    . Nasal sinus surgery    . Hemorrhoid surgery    . Other surgical history      female surgery after miscarriage, ? D&C or edometrial surgery    Social History  Substance Use Topics  . Smoking status: Never Smoker   . Smokeless tobacco: Never Used  . Alcohol Use: 0.0 oz/week    0 Standard drinks or equivalent per week     Comment: red wine glass per day- stopped for now 08-02-15    Family History  Problem Relation Age of Onset  . Diabetes Mother   . Hyperlipidemia Mother   . Hypertension Mother   . Stroke Mother   . Heart attack Neg Hx   . Colon cancer Neg Hx   . Esophageal cancer Neg Hx   . Rectal cancer Neg Hx   . Breast cancer Sister 40    deceased  . Stomach cancer Father     deceased    Allergies  Allergen Reactions  . Hydrocodone Nausea Only  . Penicillins     vomitting  . Amoxicillin Rash    Medication list  has been reviewed and updated.  Current Outpatient Prescriptions on File Prior to Visit  Medication Sig Dispense Refill  . albuterol (VENTOLIN HFA) 108 (90 BASE) MCG/ACT inhaler Inhale 2 puffs into the lungs every 6 (six) hours as needed for wheezing. 1 Inhaler 6  . aspirin 81 MG tablet Take 81 mg by mouth daily.      . beclomethasone (QVAR) 40 MCG/ACT inhaler Inhale 2 puffs into the lungs 2 (two) times daily. (Patient taking differently: Inhale 2 puffs into the lungs as needed. ) 1 Inhaler 6  . FINACEA 15 % cream     . ibuprofen (ADVIL,MOTRIN) 600 MG tablet Take 1 tablet (600 mg total) by mouth every 8 (eight) hours as needed. 90 tablet 1  . loratadine (CLARITIN) 10 MG tablet Take 10 mg by mouth as needed.     Marland Kitchen losartan-hydrochlorothiazide (HYZAAR) 100-25 MG tablet Take 1 tablet by mouth daily. 90 tablet 1  . omeprazole (PRILOSEC) 40 MG capsule Take 1 capsule (40 mg total) by mouth daily. 30 capsule 3  .  simvastatin (ZOCOR) 40 MG tablet Take 1 tablet (40 mg total) by mouth at bedtime. (Patient taking differently: Take 40 mg by mouth as needed. ) 90 tablet 2   No current facility-administered medications on file prior to visit.    Review of Systems:  As per HPI- otherwise negative.   Physical Examination: Filed Vitals:   08/27/15 1104  BP: 132/66  Pulse: 80  Temp: 97.5 F (36.4 C)   Filed Vitals:   08/27/15 1104  Height: 4\' 11"  (1.499 m)  Weight: 152 lb (68.947 kg)   Body mass index is 30.68 kg/(m^2). Ideal Body Weight: Weight in (lb) to have BMI = 25: 123.5  GEN: WDWN, NAD, Non-toxic, A & O x 3, overweight, looks well HEENT: Atraumatic, Normocephalic. Neck supple. No masses, No LAD. Bilateral TM wnl, oropharynx normal.  PEERL,EOMI.   Ears and Nose: No external deformity. CV: RRR, No M/G/R. No JVD. No thrill. No extra heart sounds. PULM: CTA B, no wheezes, crackles, rhonchi. No retractions. No resp. distress. No accessory muscle use. EXTR: No c/c/e NEURO Normal gait.  PSYCH: Normally interactive. Conversant. Not depressed or anxious appearing.  Calm demeanor.  She has tenderness when I apply pressure to one of the right maxillary molars that has been filled extensively   Assessment and Plan: Pain in a tooth or teeth - Plan: DG Sinuses Complete  Here today with tooth pain. The exact sequence of events is a little unclear, but my understanding is that she saw her DDS 2 weeks ago. She was treated with a week of abx and sent to see a "root canal specialist."  However this MD or DDS did not think that she needed a root canal. She is here today to rule- out a sinus infection before she returns to her primary DDS  I agree that this seems to be a tooth problem but will get plain films to make sure the sinuses appear clear for her today  Signed Lamar Blinks, MD

## 2015-08-27 NOTE — Patient Instructions (Signed)
Please go downstairs to have your sinus x-rays. Then you can go home and I will be in touch with these results.   Assuming you do not appear to have a sinus infection then I do think this is a tooth problem

## 2015-09-10 ENCOUNTER — Ambulatory Visit (INDEPENDENT_AMBULATORY_CARE_PROVIDER_SITE_OTHER): Payer: Managed Care, Other (non HMO) | Admitting: Internal Medicine

## 2015-09-10 ENCOUNTER — Encounter: Payer: Self-pay | Admitting: Internal Medicine

## 2015-09-10 VITALS — BP 118/68 | HR 70 | Temp 97.8°F | Ht 59.0 in | Wt 149.1 lb

## 2015-09-10 DIAGNOSIS — I10 Essential (primary) hypertension: Secondary | ICD-10-CM | POA: Diagnosis not present

## 2015-09-10 DIAGNOSIS — E119 Type 2 diabetes mellitus without complications: Secondary | ICD-10-CM | POA: Diagnosis not present

## 2015-09-10 DIAGNOSIS — Z09 Encounter for follow-up examination after completed treatment for conditions other than malignant neoplasm: Secondary | ICD-10-CM

## 2015-09-10 LAB — HEMOGLOBIN A1C: HEMOGLOBIN A1C: 6.4 % (ref 4.6–6.5)

## 2015-09-10 MED ORDER — AZELASTINE HCL 0.1 % NA SOLN: 2.0000 | Freq: Every evening | NASAL | Status: DC | PRN

## 2015-09-10 NOTE — Patient Instructions (Addendum)
GO TO THE LAB :      Get the blood work     GO TO THE FRONT DESK  Schedule your next appointment for a  Physical exam  When?   By [id number] Fasting?  Yes      For nasal congestion: Use OTC Nasocort or Flonase : 2 nasal sprays on each side of the nose in the morning until you feel better Use ASTELIN a prescribed spray : 2 nasal sprays on each side of the nose at night until you feel better Claritin 10 mg 1 tablet a day as needed  Call if no netter

## 2015-09-10 NOTE — Progress Notes (Signed)
Subjective:    Patient ID: Barbara Flores, female    DOB: 1960-07-30, 55 y.o.   MRN: [id number]  DOS:  09/10/2015 Type of visit - description : Follow-up Interval history:  good compliance with chronic medications, ambulatory BPs 120/80. Was recently seen here with sinus congestion, sinus XRs were negative, subsequently her dentist reassess the situation and eventually had a crown and a root canal today. She still have bilateral sinus pressure/congestion for  the last 2 weeks.   Review of Systems Denies fever chills Some sneezing and yellow nasal discharge. Very little cough, no wheezing  Past Medical History  Diagnosis Date  . Allergic rhinitis   . Hyperlipidemia   . History of exercise stress test 07-2010    normal except for elevated BP  . Diabetes mellitus 08/23/2010  . Asthma   . Menopause   . Rosacea   . GERD (gastroesophageal reflux disease)   . Allergy   . Hypertension     Past Surgical History  Procedure Laterality Date  . Eardrum surgery    . Nasal sinus surgery    . Hemorrhoid surgery    . Other surgical history      female surgery after miscarriage, ? D&C or edometrial surgery    Social History   Social History  . Marital Status: Married    Spouse Name: N/A  . Number of Children: 2  . Years of Education: N/A   Occupational History  . [employer]    Social History Main Topics  . Smoking status: Never Smoker   . Smokeless tobacco: Never Used  . Alcohol Use: 0.0 oz/week    0 Standard drinks or equivalent per week     Comment: red wine glass per day- stopped for now 08-02-15  . Drug Use: No  . Sexual Activity: Not on file   Other Topics Concern  . Not on file   Social History Narrative   Household pt, husband, 2 child    2 children, 66 and 73 y/o                  Medication List       This list is accurate as of: 09/10/15  1:10 PM.  Always use your most recent med list.               albuterol 108 (90 Base) MCG/ACT inhaler  Commonly known  as:  VENTOLIN HFA  Inhale 2 puffs into the lungs every 6 (six) hours as needed for wheezing.     aspirin 81 MG tablet  Take 81 mg by mouth daily.     azelastine 0.1 % nasal spray  Commonly known as:  ASTELIN  Place 2 sprays into both nostrils at bedtime as needed for rhinitis. Use in each nostril as directed     beclomethasone 40 MCG/ACT inhaler  Commonly known as:  QVAR  Inhale 2 puffs into the lungs 2 (two) times daily.     FINACEA 15 % cream  Generic drug:  Azelaic Acid     ibuprofen 600 MG tablet  Commonly known as:  ADVIL,MOTRIN  Take 1 tablet (600 mg total) by mouth every 8 (eight) hours as needed.     loratadine 10 MG tablet  Commonly known as:  CLARITIN  Take 10 mg by mouth as needed. Reported on 09/10/2015     losartan-hydrochlorothiazide 100-25 MG tablet  Commonly known as:  HYZAAR  Take 1 tablet by mouth daily.     omeprazole 40  MG capsule  Commonly known as:  PRILOSEC  Take 1 capsule (40 mg total) by mouth daily.     simvastatin 40 MG tablet  Commonly known as:  ZOCOR  Take 1 tablet (40 mg total) by mouth at bedtime.           Objective:   Physical Exam BP 118/68 mmHg  Pulse 70  Temp(Src) 97.8 F (36.6 C) (Oral)  Ht 4\' 11"  (1.499 m)  Wt 149 lb 2 oz (67.643 kg)  BMI 30.10 kg/m2  SpO2 99% General:   Well developed, well nourished . NAD.  HEENT:  Normocephalic . Face symmetric, atraumatic. Nose mildly congested, sinuses no TTP. Throat without redness or discharge. Lungs:  CTA B Normal respiratory effort, no intercostal retractions, no accessory muscle use. Heart: RRR,  no murmur.  No pretibial edema bilaterally  Skin: Not pale. Not jaundice Neurologic:  alert & oriented X3.  Speech normal, gait appropriate for age and unassisted Psych--  Cognition and judgment appear intact.  Cooperative with normal attention span and concentration.  Behavior appropriate. No anxious or depressed appearing.      Assessment & Plan:   Assessment>    Diabetes 2012 HTN Dyslipidemia Asthma Menopause Rosacea RUQ pain: Ultrasound and HIDA scan negative (02-2015, 03-2015) Stress test 2012 neg , elevated BP   PLAN Diabetes: Check A1c, on diet control HTN: Good compliance with meds, BP today is excellent, ambulatory BPs normal. Creatinine was checked 3 months ago was 0.6. No change Allergies: Sinus symptoms likely allergies, doubt bacterial infection. Recommend Flonase, Astelin, Claritin. Call if not improving. RTC 01-2016, CPX

## 2015-09-10 NOTE — Progress Notes (Signed)
Pre visit review using our clinic review tool, if applicable. No additional management support is needed unless otherwise documented below in the visit note. 

## 2015-09-10 NOTE — Assessment & Plan Note (Signed)
Diabetes: Check A1c, on diet control HTN: Good compliance with meds, BP today is excellent, ambulatory BPs normal. Creatinine was checked 3 months ago was 0.6. No change Allergies: Sinus symptoms likely allergies, doubt bacterial infection. Recommend Flonase, Astelin, Claritin. Call if not improving. RTC 01-2016, CPX

## 2015-10-08 ENCOUNTER — Encounter: Payer: Self-pay | Admitting: Gastroenterology

## 2015-10-08 ENCOUNTER — Other Ambulatory Visit: Payer: Managed Care, Other (non HMO)

## 2015-10-08 ENCOUNTER — Ambulatory Visit (INDEPENDENT_AMBULATORY_CARE_PROVIDER_SITE_OTHER): Payer: Managed Care, Other (non HMO) | Admitting: Gastroenterology

## 2015-10-08 VITALS — BP 112/70 | HR 88 | Ht 59.0 in | Wt 153.4 lb

## 2015-10-08 DIAGNOSIS — R945 Abnormal results of liver function studies: Secondary | ICD-10-CM

## 2015-10-08 DIAGNOSIS — K625 Hemorrhage of anus and rectum: Secondary | ICD-10-CM

## 2015-10-08 DIAGNOSIS — K649 Unspecified hemorrhoids: Secondary | ICD-10-CM

## 2015-10-08 DIAGNOSIS — R7989 Other specified abnormal findings of blood chemistry: Secondary | ICD-10-CM | POA: Diagnosis not present

## 2015-10-08 LAB — HEPATIC FUNCTION PANEL
ALT: 22 U/L (ref 0–35)
AST: 19 U/L (ref 0–37)
Albumin: 4.6 g/dL (ref 3.5–5.2)
Alkaline Phosphatase: 80 U/L (ref 39–117)
BILIRUBIN DIRECT: 0.1 mg/dL (ref 0.0–0.3)
BILIRUBIN TOTAL: 0.3 mg/dL (ref 0.2–1.2)
Total Protein: 8.1 g/dL (ref 6.0–8.3)

## 2015-10-08 NOTE — Patient Instructions (Signed)
Go to the basement for labs today Your 2nd banding is scheduled on 11/07/2015 at [facility]   1. The procedure you have had should have been relatively painless since the banding of the area involved does not have nerve endings and there is no pain sensation.  The rubber band cuts off the blood supply to the hemorrhoid and the band may fall off as soon as 48 hours after the banding (the band may occasionally be seen in the toilet bowl following a bowel movement). You may notice a temporary feeling of fullness in the rectum which should respond adequately to plain Tylenol or Motrin.  2. Following the banding, avoid strenuous exercise that evening and resume full activity the next day.  A sitz bath (soaking in a warm tub) or bidet is soothing, and can be useful for cleansing the area after bowel movements.     3. To avoid constipation, take two tablespoons of natural wheat bran, natural oat bran, flax, Benefiber or any over the counter fiber supplement and increase your water intake to 7-8 glasses daily.    4. Unless you have been prescribed anorectal medication, do not put anything inside your rectum for two weeks: No suppositories, enemas, fingers, etc.  5. Occasionally, you may have more bleeding than usual after the banding procedure.  This is often from the untreated hemorrhoids rather than the treated one.  Don't be concerned if there is a tablespoon or so of blood.  If there is more blood than this, lie flat with your bottom higher than your head and apply an ice pack to the area. If the bleeding does not stop within a half an hour or if you feel faint, call our office at [phone] or go to the emergency room.  6. Problems are not common; however, if there is a substantial amount of bleeding, severe pain, chills, fever or difficulty passing urine (very rare) or other problems, you should call us at [phone] or report to the nearest  emergency room.  7. Do not stay seated continuously for more than 2-3 hours for a day or two after the procedure.  Tighten your buttock muscles 10-15 times every two hours and take 10-15 deep breaths every 1-2 hours.  Do not spend more than a few minutes on the toilet if you cannot empty your bowel; instead re-visit the toilet at a later time.

## 2015-10-08 NOTE — Progress Notes (Signed)
Barbara Flores    [id number]    1960-11-28  Primary Care Physician:[name], MD  Referring Physician: Colon Branch, MD [name] [place], [name] [address]  Chief complaint: BRBPR  HPI:  55 year old female here for follow-up visit. She was last seen in office visit in Jan 2017 for abnormal right upper quadrant ultrasound which showed fatty liver with focal fat sparing in the gallbladder fossa. HIDA scan was normal. LFTs are normal. MRI showed smaller focus of fatty sparring vs small vascular shunt in L hepatic lobe. She stopped contraceptive pills and also drinking alcohol. Complaints of intermittent bright red blood per rectum, small volume mostly when she wipes. She also has itching and burning sensation in the rectum occasionally. Denies any fecal incontinence or anal seepage. Colonoscopy in February 2017 was normal except for small internal hemorrhoids and also external hemorrhoids.   Outpatient Encounter Prescriptions as of 10/08/2015  Medication Sig  . albuterol (VENTOLIN HFA) 108 (90 BASE) MCG/ACT inhaler Inhale 2 puffs into the lungs every 6 (six) hours as needed for wheezing.  Marland Kitchen aspirin 81 MG tablet Take 81 mg by mouth daily.    Marland Kitchen azelastine (ASTELIN) 0.1 % nasal spray Place 2 sprays into both nostrils at bedtime as needed for rhinitis. Use in each nostril as directed  . beclomethasone (QVAR) 40 MCG/ACT inhaler Inhale 2 puffs into the lungs 2 (two) times daily. (Patient taking differently: Inhale 2 puffs into the lungs as needed. )  . FINACEA 15 % cream   . ibuprofen (ADVIL,MOTRIN) 600 MG tablet Take 1 tablet (600 mg total) by mouth every 8 (eight) hours as needed.  . loratadine (CLARITIN) 10 MG tablet Take 10 mg by mouth as needed. Reported on 09/10/2015  . losartan-hydrochlorothiazide (HYZAAR) 100-25 MG tablet Take 1 tablet by mouth daily.  Marland Kitchen omeprazole (PRILOSEC) 40 MG capsule Take 1 capsule (40 mg total) by mouth daily.  . simvastatin (ZOCOR) 40 MG  tablet Take 1 tablet (40 mg total) by mouth at bedtime. (Patient taking differently: Take 40 mg by mouth daily. )   No facility-administered encounter medications on file as of 10/08/2015.    Allergies as of 10/08/2015 - Review Complete 10/08/2015  Allergen Reaction Noted  . Hydrocodone Nausea Only 10/09/2014  . Penicillins  07/23/2010  . Amoxicillin Rash 03/31/2014    Past Medical History  Diagnosis Date  . Allergic rhinitis   . Hyperlipidemia   . History of exercise stress test 07-2010    normal except for elevated BP  . Diabetes mellitus 08/23/2010  . Asthma   . Menopause   . Rosacea   . GERD (gastroesophageal reflux disease)   . Allergy   . Hypertension     Past Surgical History  Procedure Laterality Date  . Eardrum surgery    . Nasal sinus surgery    . Hemorrhoid surgery    . Other surgical history      female surgery after miscarriage, ? D&C or edometrial surgery    Family History  Problem Relation Age of Onset  . Diabetes Mother   . Hyperlipidemia Mother   . Hypertension Mother   . Stroke Mother   . Heart attack Neg Hx   . Colon cancer Neg Hx   . Esophageal cancer Neg Hx   . Rectal cancer Neg Hx   . Breast cancer Sister 67    deceased  . Stomach cancer Father  deceased    Social History   Social History  . Marital Status: Married    Spouse Name: N/A  . Number of Children: 2  . Years of Education: N/A   Occupational History  . [employer]    Social History Main Topics  . Smoking status: Never Smoker   . Smokeless tobacco: Never Used  . Alcohol Use: 0.0 oz/week    0 Standard drinks or equivalent per week     Comment: red wine glass per day- stopped for now 08-02-15  . Drug Use: No  . Sexual Activity: Not on file   Other Topics Concern  . Not on file   Social History Narrative   Household pt, husband, 2 child    2 children, 55 and 93 y/o                Review of systems: Review of Systems  Constitutional: Negative for fever and  chills.  HENT: Negative.   Eyes: Negative for blurred vision.  Respiratory: Negative for cough, shortness of breath and wheezing.   Cardiovascular: Negative for chest pain and palpitations.  Gastrointestinal: as per HPI Genitourinary: Negative for dysuria, urgency, frequency and hematuria.  Musculoskeletal: Negative for myalgias, back pain and joint pain.  Skin: Negative for itching and rash.  Neurological: Negative for dizziness, tremors, focal weakness, seizures and loss of consciousness.  Endo/Heme/Allergies: Negative for environmental allergies.  Psychiatric/Behavioral: Negative for depression, suicidal ideas and hallucinations.  All other systems reviewed and are negative.   Physical Exam: Filed Vitals:   10/08/15 1430  BP: 112/70  Pulse: 88   Gen:      No acute distress HEENT:  EOMI, sclera anicteric Neck:     No masses; no thyromegaly Lungs:    Clear to auscultation bilaterally; normal respiratory effort CV:         Regular rate and rhythm; no murmurs Abd:      + bowel sounds; soft, non-tender; no palpable masses, no distension Ext:    No edema; adequate peripheral perfusion Skin:      Warm and dry; no rash Neuro: alert and oriented x 3 Psych: normal mood and affect  Data Reviewed:  Reviewed chart in epic and pertinent GI workup   Assessment and Plan/Recommendations:  55 year old female with fatty liver here for follow-up visit She is avoiding alcohol and NSAID's MRI abdomen is not concerning for any mass lesion in the liver We'll recheck LFTs today Small-volume bright red blood per rectum likely secondary to hemorrhoids. She does feel the hemorrhoids prolapse out with bowel movements and reduce spontaneously. Discussed rubber band ligation of his/her symptomatic hemorrhoidal disease.  All risks, benefits and alternative forms of therapy were described and informed consent was obtained.  PROCEDURE NOTE: Symptomatic grade II  hemorrhoids, In the Left Lateral  Decubitus position anoscopic examination revealed grade II hemorrhoids in the right posterior and left lateral position(s).  The anorectum was pre-medicated with 0.125% nitroglycerin The decision was made to band the right posterior internal hemorrhoid, and the Mangum was used to perform band ligation without complication.  Digital anorectal examination was then performed to assure proper positioning of the band, and to adjust the banded tissue as required.  The patient was discharged home without pain or other issues.  Dietary and behavioral recommendations were given and along with follow-up instructions.     The following adjunctive treatments were recommended: Benefiber 1 tablespoon 3 times a day  The patient will return in 2-4 weeks for  follow-up and possible additional banding as required. No complications were encountered and the patient tolerated the procedure well.  [name] , MD [phone] Mon-Fri 8a-5p [phone] after 5p, weekends, holidays

## 2015-11-07 ENCOUNTER — Encounter: Payer: Self-pay | Admitting: Gastroenterology

## 2015-11-07 ENCOUNTER — Ambulatory Visit (INDEPENDENT_AMBULATORY_CARE_PROVIDER_SITE_OTHER): Payer: Managed Care, Other (non HMO) | Admitting: Gastroenterology

## 2015-11-07 VITALS — BP 140/80 | HR 76 | Ht 59.0 in | Wt 152.0 lb

## 2015-11-07 DIAGNOSIS — K641 Second degree hemorrhoids: Secondary | ICD-10-CM | POA: Diagnosis not present

## 2015-11-07 NOTE — Progress Notes (Signed)
PROCEDURE NOTE: The patient presents with symptomatic grade II hemorrhoids, requesting rubber band ligation of his/her hemorrhoidal disease.  All risks, benefits and alternative forms of therapy were described and informed consent was obtained.   The anorectum was pre-medicated with 0.125% nitroglycerine The decision was made to band the Right anterior internal hemorrhoid, and the [place] was used to perform band ligation without complication.  Digital anorectal examination was then performed to assure proper positioning of the band, and to adjust the banded tissue as required.  The patient was discharged home without pain or other issues.  Dietary and behavioral recommendations were given and along with follow-up instructions.     The patient will return in 2-4 weeks for  follow-up and possible additional banding as required. No complications were encountered and the patient tolerated the procedure well.  [name] , MD [phone] Mon-Fri 8a-5p [phone] after 5p, weekends, holidays

## 2015-11-07 NOTE — Patient Instructions (Addendum)
  Follow up for banding on 01/11/16 at 1:30 pm    [place]   1. The procedure you have had should have been relatively painless since the banding of the area involved does not have nerve endings and there is no pain sensation.  The rubber band cuts off the blood supply to the hemorrhoid and the band may fall off as soon as 48 hours after the banding (the band may occasionally be seen in the toilet bowl following a bowel movement). You may notice a temporary feeling of fullness in the rectum which should respond adequately to plain Tylenol or Motrin.  2. Following the banding, avoid strenuous exercise that evening and resume full activity the next day.  A sitz bath (soaking in a warm tub) or bidet is soothing, and can be useful for cleansing the area after bowel movements.     3. To avoid constipation, take two tablespoons of natural wheat bran, natural oat bran, flax, Benefiber or any over the counter fiber supplement and increase your water intake to 7-8 glasses daily.    4. Unless you have been prescribed anorectal medication, do not put anything inside your rectum for two weeks: No suppositories, enemas, fingers, etc.  5. Occasionally, you may have more bleeding than usual after the banding procedure.  This is often from the untreated hemorrhoids rather than the treated one.  Don't be concerned if there is a tablespoon or so of blood.  If there is more blood than this, lie flat with your bottom higher than your head and apply an ice pack to the area. If the bleeding does not stop within a half an hour or if you feel faint, call our office at [phone] or go to the emergency room.  6. Problems are not common; however, if there is a substantial amount of bleeding, severe pain, chills, fever or difficulty passing urine (very rare) or other problems, you should call us at [phone] or report to the nearest emergency room.  7. Do not stay seated  continuously for more than 2-3 hours for a day or two after the procedure.  Tighten your buttock muscles 10-15 times every two hours and take 10-15 deep breaths every 1-2 hours.  Do not spend more than a few minutes on the toilet if you cannot empty your bowel; instead re-visit the toilet at a later time.

## 2015-11-22 LAB — HM PAP SMEAR

## 2015-12-22 ENCOUNTER — Other Ambulatory Visit: Payer: Self-pay | Admitting: Internal Medicine

## 2016-01-11 ENCOUNTER — Encounter: Payer: Managed Care, Other (non HMO) | Admitting: Gastroenterology

## 2016-02-06 IMAGING — US US ABDOMEN COMPLETE
1 series · 13 of 25 positions shown · non-contrast
Comparison: February 21, 2014

CLINICAL DATA: One week history of nausea with right-sided
abdominal pain

EXAM:
ULTRASOUND ABDOMEN COMPLETE

[Series 1: us abdomen complete · 0.17mm/px · 13 of 79 slices shown]
[im 1/79]
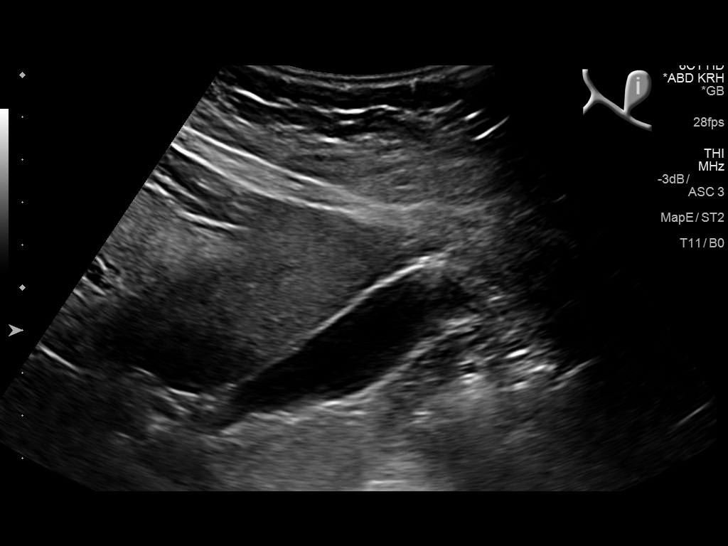
[im 7/79]
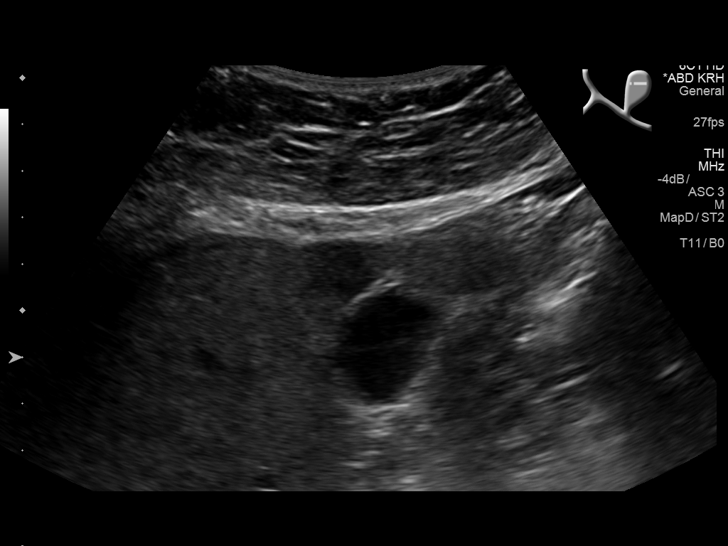
[im 14/79]
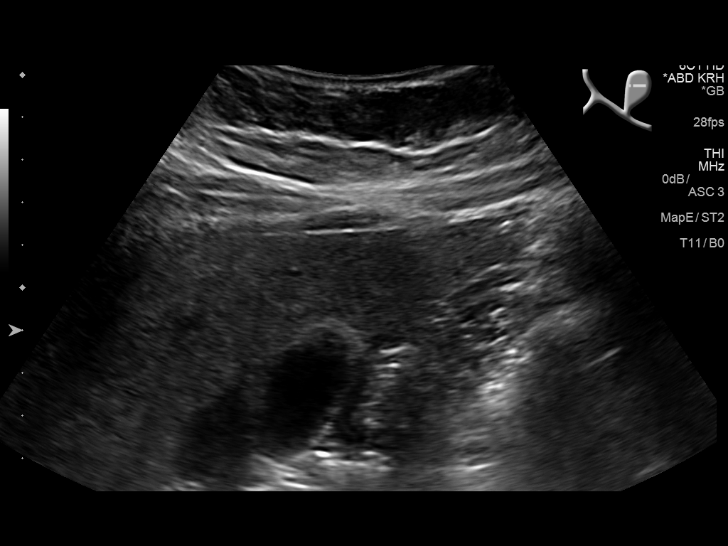
[im 20/79]
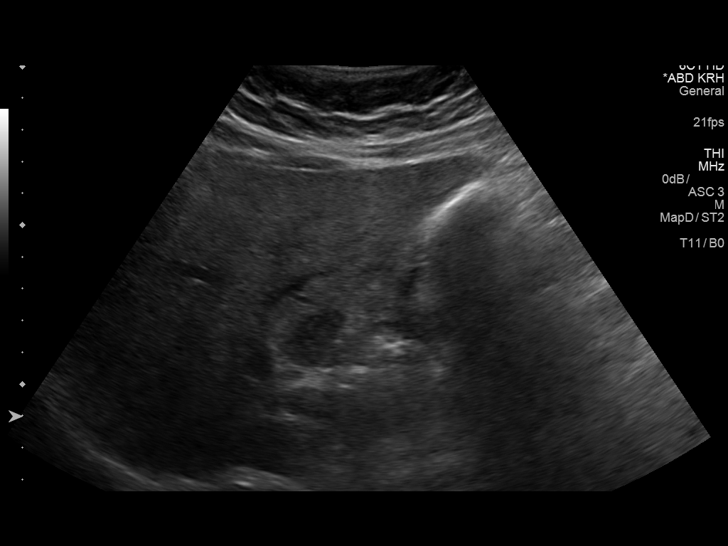
[im 27/79]
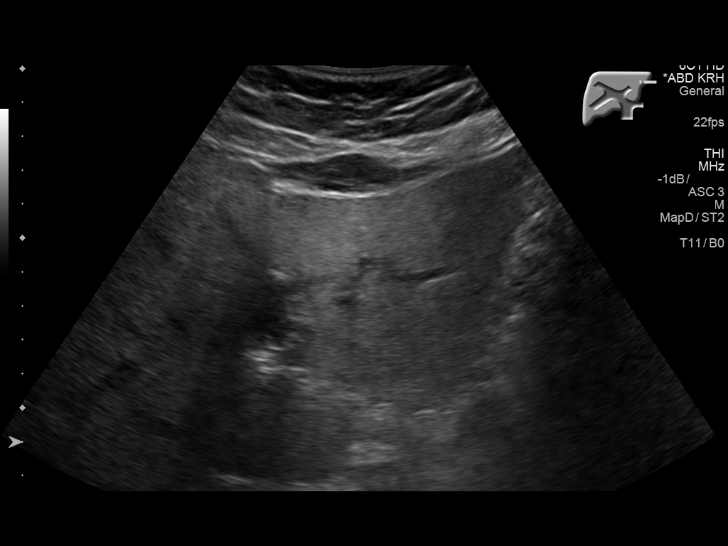
[im 33/79]
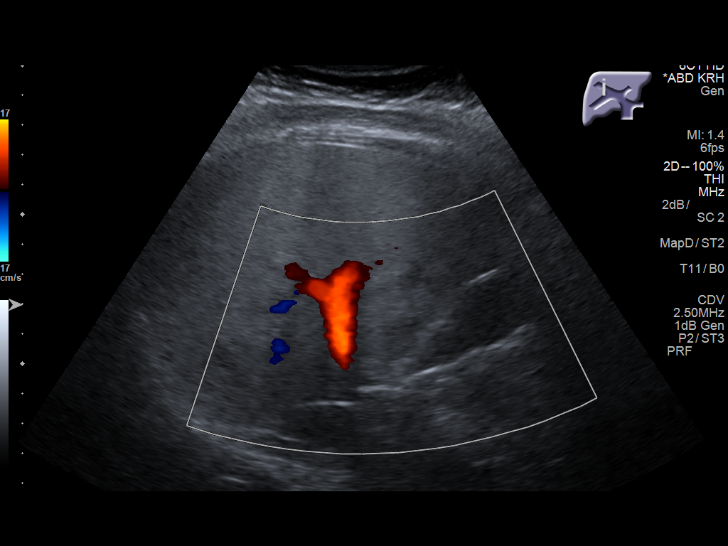
[im 40/79]
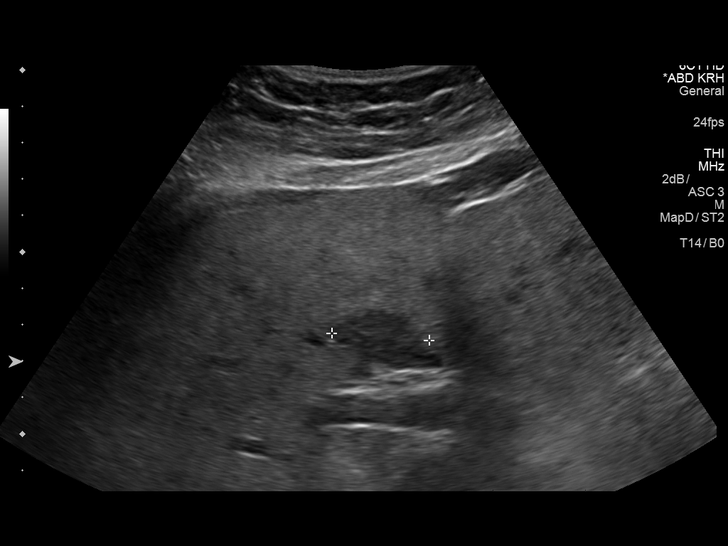
[im 46/79]
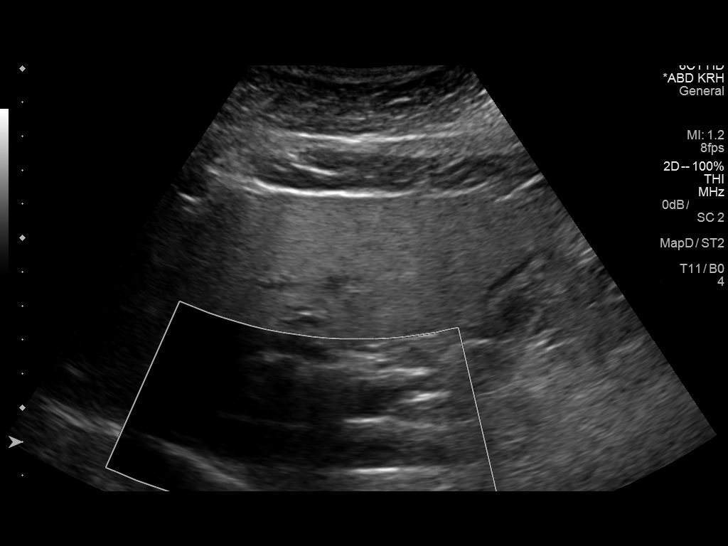
[im 53/79]
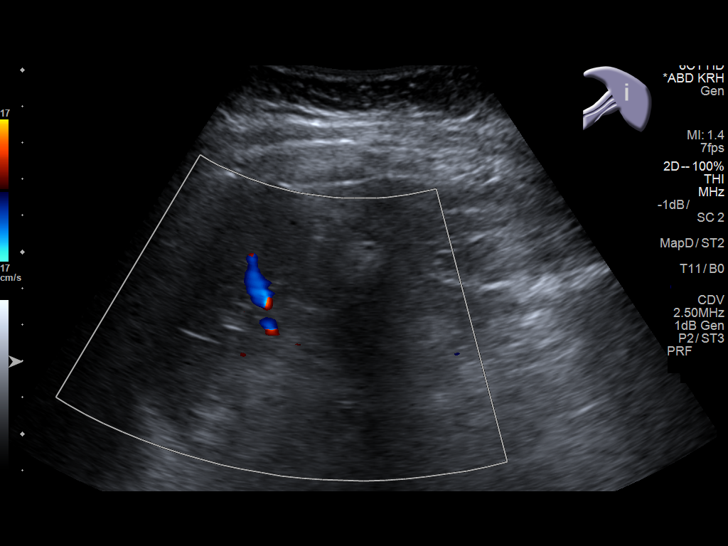
[im 59/79]
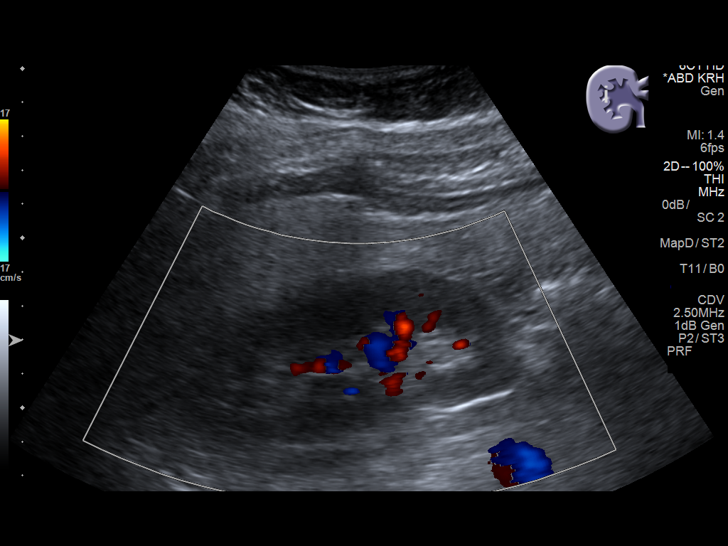
[im 66/79]
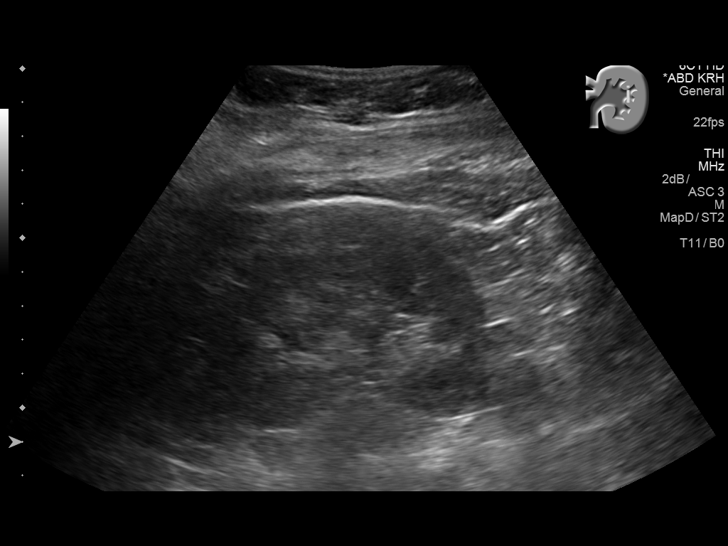
[im 72/79]
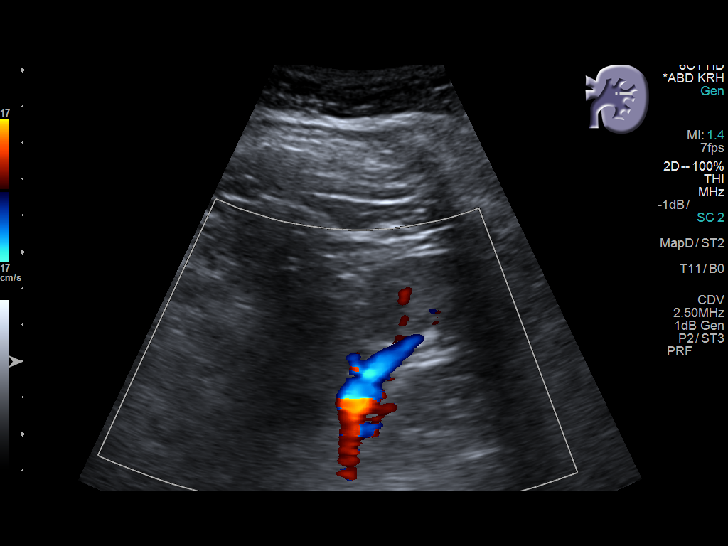
[im 79/79]
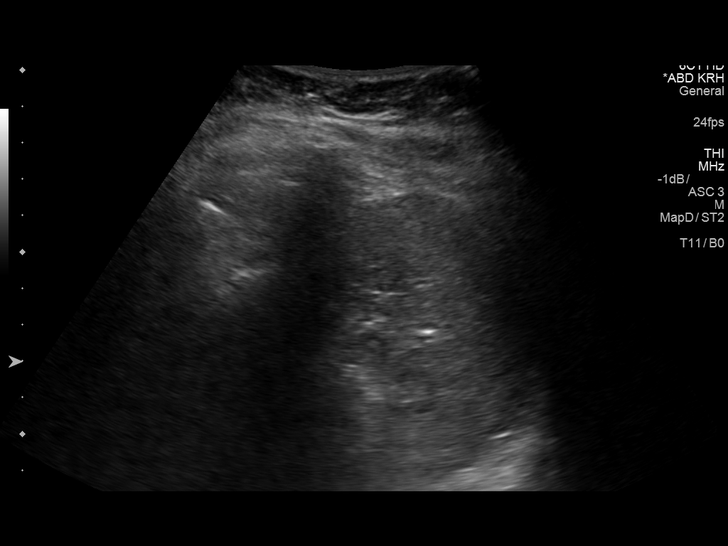

[13 of 25 positions shown; findings below may reference images not displayed]

FINDINGS: Gallbladder: No gallstones or wall thickening visualized. There is
no pericholecystic fluid. No sonographic Murphy sign noted.

Common bile duct: Diameter: 3 mm. There is no intrahepatic, common
hepatic, or common bile duct dilatation.

Liver: The overall echogenicity of the liver is increased. There is
an area of decreased liver echogenicity near the gallbladder fossa
measuring 2.9 x 1.7 cm. This area of decreased attenuation is better
defined currently compared to the prior study, although this finding
was appreciable in retrospect on the prior study.

IVC: No abnormality visualized.

Pancreas: Visualized portion unremarkable. Portions of pancreas
obscured by gas.

Spleen: Size and appearance within normal limits.

Right Kidney: Length: 10.3 cm. Echogenicity within normal limits. No
mass or hydronephrosis visualized.

Left Kidney: Length: 11.2 cm. Echogenicity within normal limits. No
mass or hydronephrosis visualized.

Abdominal aorta: No aneurysm visualized.

Other findings: No demonstrable ascites.
IMPRESSION: Liver shows increased echogenicity with probable fatty sparing near
the gallbladder fossa region. This area of decreased attenuation
near the gallbladder fossa is better defined compared to the
previous study but was appreciable on the previous study. If there
remains concern for potential mass near the gallbladder fossa within
the liver, CT or MR pre and post-contrast would be advisable to
further assess. Note that given underlying fatty change, subtle
liver lesions could be obscured, and the sensitivity of ultrasound
for focal liver lesions is diminished.

Portions of pancreas are obscured by gas. Visualized portions appear
normal.

Study otherwise unremarkable.

## 2016-02-12 ENCOUNTER — Encounter: Payer: Self-pay | Admitting: Internal Medicine

## 2016-02-12 ENCOUNTER — Ambulatory Visit (INDEPENDENT_AMBULATORY_CARE_PROVIDER_SITE_OTHER): Payer: Managed Care, Other (non HMO) | Admitting: Internal Medicine

## 2016-02-12 VITALS — BP 128/64 | HR 74 | Temp 98.1°F | Resp 14 | Ht 59.0 in | Wt 154.0 lb

## 2016-02-12 DIAGNOSIS — Z23 Encounter for immunization: Secondary | ICD-10-CM

## 2016-02-12 DIAGNOSIS — Z0000 Encounter for general adult medical examination without abnormal findings: Secondary | ICD-10-CM | POA: Diagnosis not present

## 2016-02-12 DIAGNOSIS — E119 Type 2 diabetes mellitus without complications: Secondary | ICD-10-CM | POA: Diagnosis not present

## 2016-02-12 LAB — BASIC METABOLIC PANEL
BUN: 8 mg/dL (ref 6–23)
CHLORIDE: 102 meq/L (ref 96–112)
CO2: 26 meq/L (ref 19–32)
CREATININE: 0.64 mg/dL (ref 0.40–1.20)
Calcium: 9.5 mg/dL (ref 8.4–10.5)
GFR: 124.84 mL/min (ref >60.00)
GLUCOSE: 101 mg/dL — AB (ref 70–99)
Potassium: 3.5 mEq/L (ref 3.5–5.1)
Sodium: 136 mEq/L (ref 135–145)

## 2016-02-12 LAB — LIPID PANEL
CHOL/HDL RATIO: 3
CHOLESTEROL: 167 mg/dL (ref 0–200)
HDL: 61.5 mg/dL (ref >39.00)
LDL CALC: 90 mg/dL (ref 0–99)
NONHDL: 105.67
Triglycerides: 80 mg/dL (ref 0.0–149.0)
VLDL: 16 mg/dL (ref 0.0–40.0)

## 2016-02-12 LAB — TSH: TSH: 1.47 u[IU]/mL (ref 0.35–4.50)

## 2016-02-12 LAB — HEMOGLOBIN A1C: Hgb A1c MFr Bld: 6.4 % (ref 4.6–6.5)

## 2016-02-12 NOTE — Assessment & Plan Note (Addendum)
DM: On diet control. Check A1c, discussed diet and exercise, feet exam negative, saw the eye doctor 07-2015 (-) per patient. HTN: Seems controlled, continue Hyzaar. RTC 6 months.

## 2016-02-12 NOTE — Assessment & Plan Note (Addendum)
Td 2012; pnm shot 2015, prevnar 2016 [facility]:  Colonoscopy 07-2015 (-), next 10 years (iFOB in 5 years?) Female care per gynecologist    Gyn is  Dr Lisbeth Renshaw , seen this year, had a MMG (no records) Diet -- exercise: Discussed Labs: BMP, FLP, A1c, TSH Flu shot today

## 2016-02-12 NOTE — Patient Instructions (Signed)
GO TO THE LAB : Get the blood work     GO TO THE FRONT DESK Schedule your next appointment for a  routine checkup in 6 months  

## 2016-02-12 NOTE — Progress Notes (Signed)
Pre visit review using our clinic review tool, if applicable. No additional management support is needed unless otherwise documented below in the visit note. 

## 2016-02-12 NOTE — Progress Notes (Signed)
Subjective:    Patient ID: Barbara Flores, female    DOB: 1960/06/17, 55 y.o.   MRN: [id number]  DOS:  02/12/2016 Type of visit - description : CPX Interval history: No major concerns, she remains very active. Diet is in general very good    Review of Systems Constitutional: No fever. No chills. No unexplained wt changes. No unusual sweats  HEENT: No dental problems, no ear discharge, no facial swelling, no voice changes. No eye discharge, no eye  redness , no  intolerance to light   Respiratory: No wheezing , no  difficulty breathing. No cough , no mucus production  Cardiovascular: No CP, no leg swelling , no  Palpitations  GI: no nausea, no vomiting, no diarrhea , no  abdominal pain.  No blood in the stools. No dysphagia, no odynophagia    Endocrine: No polyphagia, no polyuria , no polydipsia  GU: No dysuria, gross hematuria, difficulty urinating. No urinary urgency, no frequency.  Musculoskeletal: No joint swellings or unusual aches or pains  Skin: No change in the color of the skin, palor , no  Rash  Allergic, immunologic: No environmental allergies , no  food allergies  Neurological: No dizziness no  syncope. No headaches. No diplopia, no slurred, no slurred speech, no motor deficits, no facial  Numbness  Hematological: No enlarged lymph nodes, no easy bruising , no unusual bleedings  Psychiatry: No suicidal ideas, no hallucinations, no beavior problems, no confusion.  No unusual/severe anxiety, no depression   Past Medical History:  Diagnosis Date  . Allergy   . Asthma   . Diabetes mellitus 08/23/2010  . GERD (gastroesophageal reflux disease)   . History of exercise stress test 07-2010   normal except for elevated BP  . Hyperlipidemia   . Hypertension   . Menopause   . Rosacea     Past Surgical History:  Procedure Laterality Date  . Eardrum surgery    . HEMORRHOID SURGERY    . NASAL SINUS SURGERY    . OTHER SURGICAL HISTORY     female surgery after  miscarriage, ? D&C or edometrial surgery    Social History   Social History  . Marital status: Married    Spouse name: N/A  . Number of children: 2  . Years of education: N/A   Occupational History  . [employer]   Social History Main Topics  . Smoking status: Never Smoker  . Smokeless tobacco: Never Used  . Alcohol use 0.0 oz/week     Comment: red wine glass per day- stopped for now 08-02-15  . Drug use: No  . Sexual activity: Not on file   Other Topics Concern  . Not on file   Social History Narrative   Household pt, husband, 2 child    2 children, 82 and 30 y/o               Family History  Problem Relation Age of Onset  . Diabetes Mother   . Hyperlipidemia Mother   . Hypertension Mother   . Stroke Mother 76  . Stomach cancer Father     deceased  . Breast cancer Sister 78    deceased  . Heart attack Neg Hx   . Colon cancer Neg Hx   . Esophageal cancer Neg Hx   . Rectal cancer Neg Hx        Medication List       Accurate as of 02/12/16 10:18 AM. Always use your most recent med  list.          albuterol 108 (90 Base) MCG/ACT inhaler Commonly known as:  VENTOLIN HFA Inhale 2 puffs into the lungs every 6 (six) hours as needed for wheezing.   aspirin 81 MG tablet Take 81 mg by mouth daily.   azelastine 0.1 % nasal spray Commonly known as:  ASTELIN Place 2 sprays into both nostrils at bedtime as needed for rhinitis. Use in each nostril as directed   beclomethasone 40 MCG/ACT inhaler Commonly known as:  QVAR Inhale 2 puffs into the lungs 2 (two) times daily.   FINACEA 15 % cream Generic drug:  Azelaic Acid   ibuprofen 600 MG tablet Commonly known as:  ADVIL,MOTRIN Take 1 tablet (600 mg total) by mouth every 8 (eight) hours as needed.   loratadine 10 MG tablet Commonly known as:  CLARITIN Take 10 mg by mouth as needed. Reported on 09/10/2015   losartan-hydrochlorothiazide 100-25 MG tablet Commonly known as:  HYZAAR Take 1 tablet by mouth  daily.   omeprazole 40 MG capsule Commonly known as:  PRILOSEC Take 1 capsule (40 mg total) by mouth daily.   simvastatin 40 MG tablet Commonly known as:  ZOCOR Take 1 tablet (40 mg total) by mouth daily with breakfast.          Objective:   Physical Exam BP 128/64 (BP Location: Left Arm, Patient Position: Sitting, Cuff Size: Normal)   Pulse 74   Temp 98.1 F (36.7 C) (Oral)   Resp 14   Ht 4\' 11"  (1.499 m)   Wt 154 lb (69.9 kg)   SpO2 97%   BMI 31.10 kg/m   General:   Well developed, well nourished . NAD.  Neck: No  thyromegaly  HEENT:  Normocephalic . Face symmetric, atraumatic Lungs:  CTA B Normal respiratory effort, no intercostal retractions, no accessory muscle use. Heart: RRR,  no murmur.  No pretibial edema bilaterally  Abdomen:  Not distended, soft, non-tender. No rebound or rigidity.   Skin: Exposed areas without rash. Not pale. Not jaundice DIABETIC FEET EXAM: No lower extremity edema Normal pedal pulses bilaterally Skin normal, nails normal, no calluses Pinprick examination of the feet normal. Neurologic:  alert & oriented X3.  Speech normal, gait appropriate for age and unassisted Strength symmetric and appropriate for age.  Psych: Cognition and judgment appear intact.  Cooperative with normal attention span and concentration.  Behavior appropriate. No anxious or depressed appearing.    Assessment & Plan:   Assessment>  Diabetes 2012 HTN Dyslipidemia Asthma Menopause Rosacea RUQ pain: Ultrasound and HIDA scan negative (02-2015, 03-2015) Stress test 2012 neg , elevated BP   PLAN DM: On diet control. Check A1c, discussed diet and exercise, feet exam negative, saw the eye doctor 07-2015 (-) per patient. HTN: Seems controlled, continue Hyzaar. RTC 6 months.

## 2016-03-18 ENCOUNTER — Ambulatory Visit (INDEPENDENT_AMBULATORY_CARE_PROVIDER_SITE_OTHER): Payer: Managed Care, Other (non HMO) | Admitting: Physician Assistant

## 2016-03-18 ENCOUNTER — Encounter: Payer: Self-pay | Admitting: Physician Assistant

## 2016-03-18 VITALS — BP 128/82 | HR 81 | Temp 97.6°F | Resp 16 | Ht 59.0 in | Wt 153.1 lb

## 2016-03-18 DIAGNOSIS — R0982 Postnasal drip: Secondary | ICD-10-CM

## 2016-03-18 DIAGNOSIS — J302 Other seasonal allergic rhinitis: Secondary | ICD-10-CM

## 2016-03-18 MED ORDER — ONDANSETRON HCL 4 MG PO TABS: 4.0000 mg | ORAL_TABLET | Freq: Three times a day (TID) | ORAL | 0 refills | Status: DC | PRN

## 2016-03-18 MED ORDER — FLUTICASONE PROPIONATE 50 MCG/ACT NA SUSP: 2.0000 | Freq: Every day | NASAL | 6 refills | Status: DC

## 2016-03-18 NOTE — Progress Notes (Signed)
Patient presents to clinic today c/o 4 days of post-nasal drip and rhinorrhea with dry cough and scratchy throat. Notes sinus pressure and fatigue but denies sinus pain, ear pain, facial pain or tooth pain. Denies fever but has noted some chills. Denies recent travel or sick contact. Endorses history of allergies but is not taking anything on a regular basis.  Past Medical History:  Diagnosis Date  . Allergy   . Asthma   . Diabetes mellitus 08/23/2010  . GERD (gastroesophageal reflux disease)   . History of exercise stress test 07-2010   normal except for elevated BP  . Hyperlipidemia   . Hypertension   . Menopause   . Rosacea     Current Outpatient Prescriptions on File Prior to Visit  Medication Sig Dispense Refill  . albuterol (VENTOLIN HFA) 108 (90 BASE) MCG/ACT inhaler Inhale 2 puffs into the lungs every 6 (six) hours as needed for wheezing. 1 Inhaler 6  . aspirin 81 MG tablet Take 81 mg by mouth daily.      Marland Kitchen azelastine (ASTELIN) 0.1 % nasal spray Place 2 sprays into both nostrils at bedtime as needed for rhinitis. Use in each nostril as directed 30 mL 3  . beclomethasone (QVAR) 40 MCG/ACT inhaler Inhale 2 puffs into the lungs 2 (two) times daily. (Patient taking differently: Inhale 2 puffs into the lungs as needed. ) 1 Inhaler 6  . FINACEA 15 % cream     . ibuprofen (ADVIL,MOTRIN) 600 MG tablet Take 1 tablet (600 mg total) by mouth every 8 (eight) hours as needed. 90 tablet 1  . loratadine (CLARITIN) 10 MG tablet Take 10 mg by mouth as needed. Reported on 09/10/2015    . losartan-hydrochlorothiazide (HYZAAR) 100-25 MG tablet Take 1 tablet by mouth daily. 90 tablet 1  . omeprazole (PRILOSEC) 40 MG capsule Take 1 capsule (40 mg total) by mouth daily. 30 capsule 3  . simvastatin (ZOCOR) 40 MG tablet Take 1 tablet (40 mg total) by mouth daily with breakfast. 90 tablet 1   No current facility-administered medications on file prior to visit.     Allergies  Allergen Reactions  .  Hydrocodone Nausea Only  . Penicillins     vomitting  . Amoxicillin Rash    Family History  Problem Relation Age of Onset  . Diabetes Mother   . Hyperlipidemia Mother   . Hypertension Mother   . Stroke Mother 7  . Stomach cancer Father     deceased  . Breast cancer Sister 16    deceased  . Heart attack Neg Hx   . Colon cancer Neg Hx   . Esophageal cancer Neg Hx   . Rectal cancer Neg Hx     Social History   Social History  . Marital status: Married    Spouse name: N/A  . Number of children: 2  . Years of education: N/A   Occupational History  . [employer]   Social History Main Topics  . Smoking status: Never Smoker  . Smokeless tobacco: Never Used  . Alcohol use 0.0 oz/week     Comment: red wine glass per day- stopped for now 08-02-15  . Drug use: No  . Sexual activity: Not Asked   Other Topics Concern  . None   Social History Narrative   Household --pt, husband, son    2 children-- son 25, daughter 53         Review of Systems - See HPI.  All other ROS are  negative.  BP 128/82 (BP Location: Right Arm, Patient Position: Sitting, Cuff Size: Large)   Pulse 81   Temp 97.6 F (36.4 C) (Oral)   Resp 16   Ht 4\' 11"  (1.499 m)   Wt 153 lb 2 oz (69.5 kg)   LMP 03/08/2016   SpO2 99%   BMI 30.93 kg/m   Physical Exam  Constitutional: She is well-developed, well-nourished, and in no distress.  HENT:  Head: Normocephalic and atraumatic.  Nose: Rhinorrhea present. Right sinus exhibits no maxillary sinus tenderness and no frontal sinus tenderness. Left sinus exhibits no maxillary sinus tenderness and no frontal sinus tenderness.  Eyes: Conjunctivae are normal.  Neck: Neck supple.  Cardiovascular: Normal rate, regular rhythm, normal heart sounds and intact distal pulses.   Pulmonary/Chest: Effort normal and breath sounds normal. No respiratory distress. She has no wheezes. She has no rales. She exhibits no tenderness.  Neurological: She is alert.  Skin:  Skin is warm and dry. No rash noted.  Psychiatric: Affect normal.  Vitals reviewed.   Recent Results (from the past 2160 hour(s))  Basic metabolic panel     Status: Abnormal   Collection Time: 02/12/16 10:34 AM  Result Value Ref Range   Sodium 136 135 - 145 mEq/L   Potassium 3.5 3.5 - 5.1 mEq/L   Chloride 102 96 - 112 mEq/L   CO2 26 19 - 32 mEq/L   Glucose, Bld 101 (H) 70 - 99 mg/dL   BUN 8 6 - 23 mg/dL   Creatinine, Ser 0.64 0.40 - 1.20 mg/dL   Calcium 9.5 8.4 - 10.5 mg/dL   GFR 124.84 >60.00 mL/min  Lipid panel     Status: None   Collection Time: 02/12/16 10:34 AM  Result Value Ref Range   Cholesterol 167 0 - 200 mg/dL    Comment: ATP III Classification       Desirable:  < 200 mg/dL               Borderline High:  200 - 239 mg/dL          High:  > = 240 mg/dL   Triglycerides 80.0 0.0 - 149.0 mg/dL    Comment: Normal:  <150 mg/dLBorderline High:  150 - 199 mg/dL   HDL 61.50 >39.00 mg/dL   VLDL 16.0 0.0 - 40.0 mg/dL   LDL Cholesterol 90 0 - 99 mg/dL   Total CHOL/HDL Ratio 3     Comment:                Men          Women1/2 Average Risk     3.4          3.3Average Risk          5.0          4.42X Average Risk          9.6          7.13X Average Risk          15.0          11.0                       NonHDL 105.67     Comment: NOTE:  Non-HDL goal should be 30 mg/dL higher than patient's LDL goal (i.e. LDL goal of < 70 mg/dL, would have non-HDL goal of < 100 mg/dL)  Hemoglobin A1c     Status: None   Collection Time: 02/12/16 10:34  AM  Result Value Ref Range   Hgb A1c MFr Bld 6.4 4.6 - 6.5 %    Comment: Glycemic Control Guidelines for People with Diabetes:Non Diabetic:  <6%Goal of Therapy: <7%Additional Action Suggested:  >8%   TSH     Status: None   Collection Time: 02/12/16 10:34 AM  Result Value Ref Range   TSH 1.47 0.35 - 4.50 uIU/mL    Assessment/Plan: 1. Acute seasonal allergic rhinitis, unspecified trigger Will resume her OTC antihistamine. Will start Flonase and  Astelin. FU if not resolving. - fluticasone (FLONASE) 50 MCG/ACT nasal spray; Place 2 sprays into both nostrils daily.  Dispense: 16 g; Refill: 6  2. PND (post-nasal drip) Secondary to allergic rhinitis. Is the most likely culprit for scratchy throat. Will increase fluids. Begin saline spray and OTC antihistamine. She has been instructed to place a humidifier in the bedroom. RX as directed.  - fluticasone (FLONASE) 50 MCG/ACT nasal spray; Place 2 sprays into both nostrils daily.  Dispense: 16 g; Refill: 6   Leeanne Rio, [place]

## 2016-03-18 NOTE — Patient Instructions (Signed)
Please increase fluids.  Get plenty of rest. Place a humidifier in the bedroom. Please continue the claritin daily. Restart your Astelin as directed. Start the [facility] as directed. Saline nasal spray may also be beneficial.  Call if symptoms are not improving over the next few days.

## 2016-03-18 NOTE — Progress Notes (Signed)
Pre visit review using our clinic review tool, if applicable. No additional management support is needed unless otherwise documented below in the visit note/SLS  

## 2016-03-24 ENCOUNTER — Ambulatory Visit (INDEPENDENT_AMBULATORY_CARE_PROVIDER_SITE_OTHER): Payer: Managed Care, Other (non HMO) | Admitting: Gastroenterology

## 2016-03-24 ENCOUNTER — Encounter: Payer: Self-pay | Admitting: Gastroenterology

## 2016-03-24 VITALS — BP 136/78 | HR 88 | Ht 59.0 in | Wt 152.2 lb

## 2016-03-24 DIAGNOSIS — K642 Third degree hemorrhoids: Secondary | ICD-10-CM | POA: Diagnosis not present

## 2016-03-24 MED ORDER — LINACLOTIDE 72 MCG PO CAPS: 72.0000 ug | ORAL_CAPSULE | Freq: Every day | ORAL | 3 refills | Status: DC

## 2016-03-24 NOTE — Progress Notes (Signed)
PROCEDURE NOTE: The patient presents with symptomatic grade III  hemorrhoids, requesting rubber band ligation of his/her hemorrhoidal disease.  All risks, benefits and alternative forms of therapy were described and informed consent was obtained.   The anorectum was pre-medicated with 0.125% Nitroglycerine The decision was made to band the Left Lateral internal hemorrhoid, and the [place] was used to perform band ligation without complication.  Digital anorectal examination was then performed to assure proper positioning of the band, and to adjust the banded tissue as required.  The patient was discharged home without pain or other issues.  Dietary and behavioral recommendations were given and along with follow-up instructions.     The following adjunctive treatments were recommended: Benefiber 1 Tablespoon TID with meals Linzess 72 mcg daily Kegel exercises   No complications were encountered and the patient tolerated the procedure well.  [name] , MD [phone] Mon-Fri 8a-5p [phone] after 5p, weekends, holidays

## 2016-03-24 NOTE — Patient Instructions (Signed)

## 2016-07-10 ENCOUNTER — Ambulatory Visit (INDEPENDENT_AMBULATORY_CARE_PROVIDER_SITE_OTHER): Payer: Managed Care, Other (non HMO) | Admitting: Internal Medicine

## 2016-07-10 ENCOUNTER — Encounter: Payer: Self-pay | Admitting: Internal Medicine

## 2016-07-10 VITALS — BP 126/80 | HR 90 | Temp 97.9°F | Resp 14 | Ht 59.0 in | Wt 156.4 lb

## 2016-07-10 DIAGNOSIS — R112 Nausea with vomiting, unspecified: Secondary | ICD-10-CM | POA: Diagnosis not present

## 2016-07-10 MED ORDER — ONDANSETRON HCL 4 MG PO TABS: 4.0000 mg | ORAL_TABLET | Freq: Three times a day (TID) | ORAL | 0 refills | Status: DC | PRN

## 2016-07-10 MED FILL — ONDANSETRON HCL 4 MG TABLET: 4 | 3 days supply | Qty: 9 | Fill #0

## 2016-07-10 NOTE — Progress Notes (Signed)
Pre visit review using our clinic review tool, if applicable. No additional management support is needed unless otherwise documented below in the visit note. 

## 2016-07-10 NOTE — Patient Instructions (Addendum)
Use the nausea medication if needed  Follow a bland diet  If you are not better in the next 2 or 3 days let us know  He has severe symptoms, fever, stomach pain, change in the color of the stools: Please call the office

## 2016-07-10 NOTE — Progress Notes (Signed)
Subjective:    Patient ID: Barbara Flores, female    DOB: 1960-11-04, 56 y.o.   MRN: [id number]  DOS:  07/10/2016 Type of visit - description : Acute visit Interval history: Started  2 days ago with mild nausea without vomiting or diarrhea, on and off. Last night, she had dinner, got more nauseous and vomited once, she vomited undigested food, no blood.  Reported to mu nurse that some of her coworkers are sick with similar symptoms   Review of Systems  Denies fevers, some chills. No abdominal pain. Last bowel movement this morning, seem normal to the patient. No respiratory/URI type of symptoms  Past Medical History:  Diagnosis Date  . Allergy   . Asthma   . Diabetes mellitus 08/23/2010  . GERD (gastroesophageal reflux disease)   . History of exercise stress test 07-2010   normal except for elevated BP  . Hyperlipidemia   . Hypertension   . Menopause   . Rosacea     Past Surgical History:  Procedure Laterality Date  . Eardrum surgery    . HEMORRHOID SURGERY    . NASAL SINUS SURGERY    . OTHER SURGICAL HISTORY     female surgery after miscarriage, ? D&C or edometrial surgery    Social History   Social History  . Marital status: Married    Spouse name: N/A  . Number of children: 2  . Years of education: N/A   Occupational History  . [employer]   Social History Main Topics  . Smoking status: Never Smoker  . Smokeless tobacco: Never Used  . Alcohol use 0.0 oz/week     Comment: red wine glass per day- stopped for now 08-02-15  . Drug use: No  . Sexual activity: Not on file   Other Topics Concern  . Not on file   Social History Narrative   Household --pt, husband, son    2 children-- son 62, daughter 75           Allergies as of 07/10/2016      Reactions   Hydrocodone Nausea Only   Penicillins    vomitting   Amoxicillin Rash      Medication List       Accurate as of 07/10/16  8:46 PM. Always use your most recent med list.            albuterol 108 (90 Base) MCG/ACT inhaler Commonly known as:  VENTOLIN HFA Inhale 2 puffs into the lungs every 6 (six) hours as needed for wheezing.   aspirin 81 MG tablet Take 81 mg by mouth daily.   azelastine 0.1 % nasal spray Commonly known as:  ASTELIN Place 2 sprays into both nostrils at bedtime as needed for rhinitis. Use in each nostril as directed   beclomethasone 40 MCG/ACT inhaler Commonly known as:  QVAR Inhale 2 puffs into the lungs 2 (two) times daily.   FINACEA 15 % cream Generic drug:  Azelaic Acid   fluticasone 50 MCG/ACT nasal spray Commonly known as:  FLONASE Place 2 sprays into both nostrils daily.   ibuprofen 600 MG tablet Commonly known as:  ADVIL,MOTRIN Take 1 tablet (600 mg total) by mouth every 8 (eight) hours as needed.   linaclotide 72 MCG capsule Commonly known as:  LINZESS Take 1 capsule (72 mcg total) by mouth daily before breakfast.   loratadine 10 MG tablet Commonly known as:  CLARITIN Take 10 mg by mouth as needed. Reported on 09/10/2015   losartan-hydrochlorothiazide 100-25  MG tablet Commonly known as:  HYZAAR Take 1 tablet by mouth daily.   omeprazole 40 MG capsule Commonly known as:  PRILOSEC Take 1 capsule (40 mg total) by mouth daily.   ondansetron 4 MG tablet Commonly known as:  ZOFRAN Take 1 tablet (4 mg total) by mouth every 8 (eight) hours as needed for nausea or vomiting.   PARoxetine 10 MG tablet Commonly known as:  PAXIL Take 1 tablet by mouth daily.   simvastatin 40 MG tablet Commonly known as:  ZOCOR Take 1 tablet (40 mg total) by mouth daily with breakfast.          Objective:   Physical Exam BP 126/80 (BP Location: Left Arm, Patient Position: Sitting, Cuff Size: Normal)   Pulse 90   Temp 97.9 F (36.6 C) (Oral)   Resp 14   Ht 4\' 11"  (1.499 m)   Wt 156 lb 6 oz (70.9 kg)   SpO2 97%   BMI 31.58 kg/m  General:   Well developed, well nourished . NAD.  HEENT:  Normocephalic . Face symmetric, atraumatic.  Not pale Lungs:  CTA B Normal respiratory effort, no intercostal retractions, no accessory muscle use. Heart: RRR,  no murmur.  no pretibial edema bilaterally  Abdomen:  Not distended, soft, non-tender. No rebound or rigidity.  Skin: Not pale. Not jaundice Neurologic:  alert & oriented X3.  Speech normal, gait appropriate for age and unassisted Psych--  Cognition and judgment appear intact.  Cooperative with normal attention span and concentration.  Behavior appropriate. No anxious or depressed appearing.    Assessment & Plan:    Assessment>  Diabetes 2012 HTN Dyslipidemia Asthma Menopause Rosacea RUQ pain: Ultrasound and HIDA scan negative (02-2015, 03-2015) Stress test 2012 neg , elevated BP   PLAN Nausea, one episode of vomiting: No red flag symptoms, exam benign, related to a virus ? some people at her job w/  Similar sx Recommend conservative treatment, see instructions.

## 2016-07-10 NOTE — Assessment & Plan Note (Signed)
Nausea, one episode of vomiting: No red flag symptoms, exam benign, related to a virus ? some people at her job w/  Similar sx Recommend conservative treatment, see instructions.

## 2016-07-18 ENCOUNTER — Telehealth: Payer: Self-pay | Admitting: Internal Medicine

## 2016-07-18 MED ORDER — PROMETHAZINE HCL 12.5 MG PO TABS: 12.5000 mg | ORAL_TABLET | Freq: Three times a day (TID) | ORAL | 0 refills | Status: DC | PRN

## 2016-07-18 NOTE — Telephone Encounter (Signed)
Please advise 

## 2016-07-18 NOTE — Telephone Encounter (Signed)
Patient called stating that ondansetron (ZOFRAN) 4 MG tablet makes her sleepy. She is wondering if she could get a different nausea medication called in. Please advise  Pharmacy: [place], [place]

## 2016-07-18 NOTE — Telephone Encounter (Signed)
Send Phenergan 12.5 mg one by mouth 3 times a day when necessary #12 no refills. May also cause drowsiness. More important, if she still has symptoms, needs to be seen, I was expecting her to be better by now.

## 2016-07-18 NOTE — Telephone Encounter (Signed)
LMOM informing Pt of recommendations and that Rx has been sent to [facility]. Instructed she will need to be seen again if not improving.

## 2016-07-21 ENCOUNTER — Encounter: Payer: Self-pay | Admitting: Medical

## 2016-07-21 ENCOUNTER — Ambulatory Visit (INDEPENDENT_AMBULATORY_CARE_PROVIDER_SITE_OTHER): Payer: Managed Care, Other (non HMO) | Admitting: Medical

## 2016-07-21 VITALS — BP 171/82 | HR 75 | Temp 97.8°F | Resp 16 | Ht 59.0 in | Wt 154.4 lb

## 2016-07-21 DIAGNOSIS — I10 Essential (primary) hypertension: Secondary | ICD-10-CM

## 2016-07-21 DIAGNOSIS — R109 Unspecified abdominal pain: Secondary | ICD-10-CM | POA: Diagnosis not present

## 2016-07-21 DIAGNOSIS — R110 Nausea: Secondary | ICD-10-CM

## 2016-07-21 DIAGNOSIS — K5900 Constipation, unspecified: Secondary | ICD-10-CM | POA: Diagnosis not present

## 2016-07-21 LAB — CBC WITH DIFFERENTIAL/PLATELET
BASOS PCT: 0.4 % (ref 0.0–3.0)
Basophils Absolute: 0 10*3/uL (ref 0.0–0.1)
EOS ABS: 0.1 10*3/uL (ref 0.0–0.7)
EOS PCT: 1.9 % (ref 0.0–5.0)
HCT: 41.2 % (ref 36.0–46.0)
HEMOGLOBIN: 14 g/dL (ref 12.0–15.0)
LYMPHS ABS: 2.9 10*3/uL (ref 0.7–4.0)
Lymphocytes Relative: 39.4 % (ref 12.0–46.0)
MCHC: 34 g/dL (ref 30.0–36.0)
MCV: 87.4 fl (ref 78.0–100.0)
MONO ABS: 0.7 10*3/uL (ref 0.1–1.0)
Monocytes Relative: 9 % (ref 3.0–12.0)
NEUTROS ABS: 3.6 10*3/uL (ref 1.4–7.7)
NEUTROS PCT: 49.3 % (ref 43.0–77.0)
PLATELETS: 285 10*3/uL (ref 150.0–400.0)
RBC: 4.72 Mil/uL (ref 3.87–5.11)
RDW: 13.5 % (ref 11.5–15.5)
WBC: 7.3 10*3/uL (ref 4.0–10.5)

## 2016-07-21 LAB — COMPREHENSIVE METABOLIC PANEL
ALK PHOS: 80 U/L (ref 39–117)
ALT: 30 U/L (ref 0–35)
AST: 28 U/L (ref 0–37)
Albumin: 4.7 g/dL (ref 3.5–5.2)
BILIRUBIN TOTAL: 0.3 mg/dL (ref 0.2–1.2)
BUN: 11 mg/dL (ref 6–23)
CALCIUM: 9.5 mg/dL (ref 8.4–10.5)
CO2: 25 mEq/L (ref 19–32)
Chloride: 105 mEq/L (ref 96–112)
Creatinine, Ser: 0.62 mg/dL (ref 0.40–1.20)
GFR: 129.29 mL/min (ref >60.00)
GLUCOSE: 86 mg/dL (ref 70–99)
POTASSIUM: 3.6 meq/L (ref 3.5–5.1)
Sodium: 140 mEq/L (ref 135–145)
TOTAL PROTEIN: 8.6 g/dL — AB (ref 6.0–8.3)

## 2016-07-21 LAB — AMYLASE: Amylase: 57 U/L (ref 27–131)

## 2016-07-21 LAB — LIPASE: Lipase: 20 U/L (ref 11.0–59.0)

## 2016-07-21 MED ORDER — RANITIDINE HCL 150 MG PO CAPS: 150.0000 mg | ORAL_CAPSULE | Freq: Two times a day (BID) | ORAL | 0 refills | Status: DC

## 2016-07-21 MED ORDER — ONDANSETRON 8 MG PO TBDP: 8.0000 mg | ORAL_TABLET | Freq: Three times a day (TID) | ORAL | 0 refills | Status: DC | PRN

## 2016-07-21 MED FILL — ONDANSETRON ODT 8 MG TABLET: 8 | 2 days supply | Qty: 9 | Fill #0

## 2016-07-21 NOTE — Progress Notes (Signed)
Subjective:    Patient ID: Barbara Flores, female    DOB: Jul 07, 1960, 56 y.o.   MRN: [id number]  HPI   Pt in reporting that she has nauseau ,vomiting, chills, sweating, loss of appetite and fatigue for 2 weeks. But body aches this Friday this am. Now body aches less than 1st day.  Pt states some constipation recently(but recent hx of alternating loose stools) Other day constipation.. Pt states phenergan which Dr. Larose Kells wrote 2 weeks ago made her constipated. Last bm 2 days ago was normal.  Pt has high blood pressure. She has not taken her bp med for one week. No cardiac or neurologic signs or symptoms.   Nausea and vomiting about twice a day last 4-5 days.  Pt is perimenopausal. Describes over last year gets hot flashes.    Review of Systems  Constitutional: Positive for chills and fatigue.  HENT: Negative for congestion, hearing loss, nosebleeds, postnasal drip, rhinorrhea, sinus pain and sinus pressure.   Respiratory: Positive for cough.        Rare cough.  Cardiovascular: Negative for chest pain and palpitations.  Gastrointestinal: Positive for abdominal pain, constipation, diarrhea, nausea and vomiting. Negative for abdominal distention and rectal pain.       See hpi.  ibs type pattern per pt.  Musculoskeletal: Positive for myalgias. Negative for gait problem and neck stiffness.       See hpi.  Skin: Negative for rash.  Psychiatric/Behavioral: Negative for behavioral problems and confusion.   Past Medical History:  Diagnosis Date  . Allergy   . Asthma   . Diabetes mellitus 08/23/2010  . GERD (gastroesophageal reflux disease)   . History of exercise stress test 07-2010   normal except for elevated BP  . Hyperlipidemia   . Hypertension   . Menopause   . Rosacea      Social History   Social History  . Marital status: Married    Spouse name: N/A  . Number of children: 2  . Years of education: N/A   Occupational History  . [employer]   Social History Main  Topics  . Smoking status: Never Smoker  . Smokeless tobacco: Never Used  . Alcohol use 0.0 oz/week     Comment: red wine glass per day- stopped for now 08-02-15  . Drug use: No  . Sexual activity: Not on file   Other Topics Concern  . Not on file   Social History Narrative   Household --pt, husband, son    2 children-- son 27, daughter 25         Past Surgical History:  Procedure Laterality Date  . Eardrum surgery    . HEMORRHOID SURGERY    . NASAL SINUS SURGERY    . OTHER SURGICAL HISTORY     female surgery after miscarriage, ? D&C or edometrial surgery    Family History  Problem Relation Age of Onset  . Diabetes Mother   . Hyperlipidemia Mother   . Hypertension Mother   . Stroke Mother 52  . Stomach cancer Father     deceased  . Breast cancer Sister 51    deceased  . Heart attack Neg Hx   . Colon cancer Neg Hx   . Esophageal cancer Neg Hx   . Rectal cancer Neg Hx     Allergies  Allergen Reactions  . Hydrocodone Nausea Only  . Penicillins     vomitting  . Amoxicillin Rash    Current Outpatient Prescriptions on File Prior  to Visit  Medication Sig Dispense Refill  . albuterol (VENTOLIN HFA) 108 (90 BASE) MCG/ACT inhaler Inhale 2 puffs into the lungs every 6 (six) hours as needed for wheezing. 1 Inhaler 6  . aspirin 81 MG tablet Take 81 mg by mouth daily.      Marland Kitchen azelastine (ASTELIN) 0.1 % nasal spray Place 2 sprays into both nostrils at bedtime as needed for rhinitis. Use in each nostril as directed 30 mL 3  . beclomethasone (QVAR) 40 MCG/ACT inhaler Inhale 2 puffs into the lungs 2 (two) times daily. 1 Inhaler 6  . FINACEA 15 % cream     . fluticasone (FLONASE) 50 MCG/ACT nasal spray Place 2 sprays into both nostrils daily. 16 g 6  . ibuprofen (ADVIL,MOTRIN) 600 MG tablet Take 1 tablet (600 mg total) by mouth every 8 (eight) hours as needed. 90 tablet 1  . linaclotide (LINZESS) 72 MCG capsule Take 1 capsule (72 mcg total) by mouth daily before breakfast. 30  capsule 3  . loratadine (CLARITIN) 10 MG tablet Take 10 mg by mouth as needed. Reported on 09/10/2015    . losartan-hydrochlorothiazide (HYZAAR) 100-25 MG tablet Take 1 tablet by mouth daily. 90 tablet 1  . omeprazole (PRILOSEC) 40 MG capsule Take 1 capsule (40 mg total) by mouth daily. 30 capsule 3  . PARoxetine (PAXIL) 10 MG tablet Take 1 tablet by mouth daily.    . simvastatin (ZOCOR) 40 MG tablet Take 1 tablet (40 mg total) by mouth daily with breakfast. 90 tablet 1   No current facility-administered medications on file prior to visit.     BP (!) 171/82 (BP Location: Left Arm, Patient Position: Sitting, Cuff Size: Large) Comment: Pt has Not had BP Medication in 1 week/SLS  Pulse 75   Temp 97.8 F (36.6 C) (Oral)   Resp 16   Ht 4\' 11"  (1.499 m)   Wt 154 lb 6 oz (70 kg)   SpO2 99%   BMI 31.18 kg/m       Objective:   Physical Exam  General Appearance- Not in acute distress.  HEENT Eyes- Scleraeral/Conjuntiva-bilat- Not Yellow. Mouth & Throat- Normal.  Chest and Lung Exam Auscultation: Breath sounds:-Normal. Adventitious sounds:- No Adventitious sounds.  Cardiovascular Auscultation:Rythm - Regular. Heart Sounds -Normal heart sounds.  Abdomen Inspection:-Inspection Normal.  Palpation/Perucssion: Palpation and Percussion of the abdomen reveal- Non Tender, No Rebound tenderness, No rigidity(Guarding) and No Palpable abdominal masses.  Liver:-Normal.  Spleen:- Normal.   Back- no cva pain.Marland Kitchen     Neurologic Cranial Nerve exam:- CN III-XII intact(No nystagmus), symmetric smile. Strength:- 5/5 equal and symmetric strength both upper and lower extremities.      Assessment & Plan:  For your htn please take bp medication today and continue daily. If you have cardiac or neurologic signs or symptoms then ED evaluation.  For abdomen pain rx ranitidine.  For nausea or coming rx zofran.  Please get labs today. Cbc,cmp, amylase, lipase and h pylori.  For loose stools  with alternating constipation recommend metamucil 1 rounded tablespoon in 8 oz of water 3 times daily.  You bodyaches on Friday may have represented viral syndrome. But pat 48 hour time frame to treat flu in event was flu. But your presentation does not appear influenza like presently.  Follow up in 7 days or as needed  Darnita Woodrum, Percell Miller, [employer]

## 2016-07-21 NOTE — Patient Instructions (Addendum)
For your htn please take bp medication today and continue daily. If you have cardiac or neurologic signs or symptoms then ED evaluation.  For abdomen pain rx ranitidine.  For nausea or coming rx zofran.  Please get labs today. Cbc,cmp, amylase, lipase and h pylori.  For loose stools with alternating constipation recommend metamucil 1 rounded tablespoon in 8 oz of water 3 times daily.  You bodyaches on Friday may have represented viral syndrome. But pat 48 hour time frame to treat flu in event was flu. But your presentation does not appear influenza like presently.  Follow up in 7 days or as needed

## 2016-07-21 NOTE — Progress Notes (Signed)
Pre visit review using our clinic review tool, if applicable. No additional management support is needed unless otherwise documented below in the visit note/SLS  

## 2016-07-22 ENCOUNTER — Telehealth: Payer: Self-pay | Admitting: Internal Medicine

## 2016-07-22 ENCOUNTER — Other Ambulatory Visit: Payer: Self-pay | Admitting: Internal Medicine

## 2016-07-22 LAB — H. PYLORI BREATH TEST: H. PYLORI BREATH TEST: DETECTED — AB

## 2016-07-22 MED ORDER — METRONIDAZOLE 500 MG PO TABS: 500.0000 mg | ORAL_TABLET | Freq: Three times a day (TID) | ORAL | 0 refills | Status: DC

## 2016-07-22 MED ORDER — OMEPRAZOLE 40 MG PO CPDR: 40.0000 mg | DELAYED_RELEASE_CAPSULE | Freq: Every day | ORAL | 3 refills | Status: DC

## 2016-07-22 MED ORDER — CLARITHROMYCIN ER 500 MG PO TB24: 1000.0000 mg | ORAL_TABLET | Freq: Every day | ORAL | 0 refills | Status: DC

## 2016-07-22 NOTE — Telephone Encounter (Signed)
Note completed, placed at front desk; [facility] with contact name and number to inform patient she can p/u during regular business hours/SLS 02/06

## 2016-07-22 NOTE — Telephone Encounter (Signed)
Can you write that note and put my stamp signature on it. I don't know what our policy is on stamp signatures?

## 2016-07-22 NOTE — Telephone Encounter (Signed)
Patient called stating that she asked for a doctors note at her appointment yesterday to return to work today 07/22/16. She did not return to work today but will return tomorrow and would like to get a note for that. She would like to pick that up today. Please advise  Phone: [phone]

## 2016-07-22 NOTE — Telephone Encounter (Signed)
While on treatment for h pylori. Stop zocor for 2 weeks. Epic brought up interaction between biaxin and zocor. Please notify pt.

## 2016-07-23 ENCOUNTER — Telehealth: Payer: Self-pay | Admitting: Gastroenterology

## 2016-07-23 ENCOUNTER — Other Ambulatory Visit: Payer: Self-pay | Admitting: *Deleted

## 2016-07-23 MED ORDER — LUBIPROSTONE 24 MCG PO CAPS: 24.0000 ug | ORAL_CAPSULE | Freq: Two times a day (BID) | ORAL | 3 refills | Status: DC

## 2016-07-23 NOTE — Telephone Encounter (Signed)
Amitiza 54mcg BID. Thank you

## 2016-07-23 NOTE — Telephone Encounter (Signed)
Sent in tier 1 Amitiza to her pharmacy

## 2016-07-23 NOTE — Telephone Encounter (Signed)
SEE Result note. 

## 2016-07-23 NOTE — Telephone Encounter (Signed)
Pt called in returning [place] call for lab results.

## 2016-07-23 NOTE — Telephone Encounter (Signed)
Patient saying Linzess is to expensive do you want to send Amitiza ? 8 or 24

## 2016-07-30 ENCOUNTER — Encounter: Payer: Self-pay | Admitting: Nurse Practitioner

## 2016-07-30 ENCOUNTER — Ambulatory Visit (INDEPENDENT_AMBULATORY_CARE_PROVIDER_SITE_OTHER): Payer: Managed Care, Other (non HMO) | Admitting: Nurse Practitioner

## 2016-07-30 VITALS — BP 160/90 | HR 92 | Ht 59.0 in | Wt 158.0 lb

## 2016-07-30 DIAGNOSIS — R110 Nausea: Secondary | ICD-10-CM | POA: Diagnosis not present

## 2016-07-30 NOTE — Progress Notes (Signed)
HPI: Patient is a 56 year old female known to Dr. Clint Lipps history of fatty liver disease and hemorrhoidal bleeding for which she has undergone banding. Patient is here today for evaluation of nausea. She saw her PCP for this late January, some of her coworkers had been ill with similar symptoms so she was treated conservatively. Patient returned to PCP several days later with nausea, vomiting, chills and fatigue. No significant abdominal pain. She had not started any new medications. Labs were obtained and she was treated with Zantac and Zofran. Chem profile was unremarkable as was CBC. She had an H. Pylori breath test which was positive. She is allergic to PCN so biaxin, flagyl and PPI prescribed. Patient comes in today for persistent nausea without weight loss. Nausea not always related to eating. Menopause according to [place]   Past Medical History:  Diagnosis Date  . Allergy   . Asthma   . Diabetes mellitus 08/23/2010  . GERD (gastroesophageal reflux disease)   . History of exercise stress test 07-2010   normal except for elevated BP  . Hyperlipidemia   . Hypertension   . Menopause   . Rosacea      Past Surgical History:  Procedure Laterality Date  . Eardrum surgery    . HEMORRHOID SURGERY    . NASAL SINUS SURGERY    . OTHER SURGICAL HISTORY     female surgery after miscarriage, ? D&C or edometrial surgery   Family History  Problem Relation Age of Onset  . Diabetes Mother   . Hyperlipidemia Mother   . Hypertension Mother   . Stroke Mother 40  . Stomach cancer Father     deceased  . Breast cancer Sister 68    deceased  . Heart attack Neg Hx   . Colon cancer Neg Hx   . Esophageal cancer Neg Hx   . Rectal cancer Neg Hx    Social History  Substance Use Topics  . Smoking status: Never Smoker  . Smokeless tobacco: Never Used  . Alcohol use 0.0 oz/week     Comment: red wine glass per day- stopped for now 08-02-15   Current Outpatient Prescriptions  Medication  Sig Dispense Refill  . albuterol (VENTOLIN HFA) 108 (90 BASE) MCG/ACT inhaler Inhale 2 puffs into the lungs every 6 (six) hours as needed for wheezing. 1 Inhaler 6  . aspirin 81 MG tablet Take 81 mg by mouth daily.      Marland Kitchen azelastine (ASTELIN) 0.1 % nasal spray Place 2 sprays into both nostrils at bedtime as needed for rhinitis. Use in each nostril as directed 30 mL 3  . beclomethasone (QVAR) 40 MCG/ACT inhaler Inhale 2 puffs into the lungs 2 (two) times daily. 1 Inhaler 6  . clarithromycin (BIAXIN XL) 500 MG 24 hr tablet Take 2 tablets (1,000 mg total) by mouth daily. 20 tablet 0  . FINACEA 15 % cream     . fluticasone (FLONASE) 50 MCG/ACT nasal spray Place 2 sprays into both nostrils daily. 16 g 6  . ibuprofen (ADVIL,MOTRIN) 600 MG tablet Take 1 tablet (600 mg total) by mouth every 8 (eight) hours as needed. 90 tablet 1  . linaclotide (LINZESS) 72 MCG capsule Take 1 capsule (72 mcg total) by mouth daily before breakfast. 30 capsule 3  . loratadine (CLARITIN) 10 MG tablet Take 10 mg by mouth as needed. Reported on 09/10/2015    . losartan-hydrochlorothiazide (HYZAAR) 100-25 MG tablet Take 1 tablet by mouth daily. 90 tablet 2  .  lubiprostone (AMITIZA) 24 MCG capsule Take 1 capsule (24 mcg total) by mouth 2 (two) times daily with a meal. 60 capsule 3  . metroNIDAZOLE (FLAGYL) 500 MG tablet Take 1 tablet (500 mg total) by mouth 3 (three) times daily. 30 tablet 0  . omeprazole (PRILOSEC) 40 MG capsule Take 1 capsule (40 mg total) by mouth daily. 30 capsule 3  . ondansetron (ZOFRAN ODT) 8 MG disintegrating tablet Take 1 tablet (8 mg total) by mouth every 8 (eight) hours as needed for nausea or vomiting. 20 tablet 0  . PARoxetine (PAXIL) 10 MG tablet Take 1 tablet by mouth daily.    . ranitidine (ZANTAC) 150 MG capsule Take 1 capsule (150 mg total) by mouth 2 (two) times daily. 60 capsule 0  . simvastatin (ZOCOR) 40 MG tablet Take 1 tablet (40 mg total) by mouth daily with breakfast. 90 tablet 1   No  current facility-administered medications for this visit.    Allergies  Allergen Reactions  . Hydrocodone Nausea Only  . Penicillins     vomitting  . Amoxicillin Rash     Review of Systems: All systems reviewed and negative except where noted in HPI.    Physical Exam: BP (!) 160/90   Pulse 92   Ht 4\' 11"  (1.499 m)   Wt 158 lb (71.7 kg)   BMI 31.91 kg/m  Constitutional:  Well-developed, Asian female in no acute distress. Psychiatric: Normal mood and affect. Behavior is normal. HEENT: Normocephalic and atraumatic. Conjunctivae are normal. No scleral icterus. Neck supple.  Cardiovascular: Normal rate, regular rhythm.  Pulmonary/chest: Effort normal and breath sounds normal. No wheezing, rales or rhonchi. Abdominal: Soft, nondistended, nontender. Bowel sounds active throughout. There are no masses palpable. No hepatomegaly. Extremities: no edema Lymphadenopathy: No cervical adenopathy noted. Neurological: Alert and oriented to person place and time. Skin: Skin is warm and dry. No rashes noted.   ASSESSMENT AND PLAN:  1. 56 yo female from [place] with  2-3 week history of frequent nausea. Menopause per PMH. Her labs are unremarkable. H.pylori breath test positive, taking biaxin and flagyl. It is possible that her nausea now is lingering because of the flagyl.  -H.pylori treatment can wait for now. Let's reassess nausea off antibiotics. Continue PPI. If nausea persists off antibiotics then will need further evaluation. Otherwise we can look for a different H.pylori regimen in the next couple of weeks. Low suspicion for PUD but her father died of gastric cancer so the infection should be eradicated. She will touch base with me with an update next week.  -Zofran called to pharmacy. For some reason pharmacy didn't have the rx of zofran called by PCP  2. Elevated BP, 160/90 today. She isn't taken BP meds like she should. I encouraged her to take meds as prescribed as uncontrolled HTN can  lead to organ damage.   Tye Savoy, NP  07/30/2016, 2:33 PM    cc:  Colon Branch, MD

## 2016-07-30 NOTE — Patient Instructions (Signed)
Stop Flagyl and Biaxin. Continue Omeprazole.   Call us in 7-10 days with an update.

## 2016-08-11 NOTE — Progress Notes (Signed)
Will need to confirm H.pylori eradication 4 weeks after completion of treatment off PPI .   Reviewed and agree with documentation and assessment and plan. [name] , MD

## 2016-08-12 ENCOUNTER — Telehealth: Payer: Self-pay | Admitting: Nurse Practitioner

## 2016-08-12 NOTE — Telephone Encounter (Signed)
Hi Beth, I didn't hear back from this patient. Did nausea get better off antibiotics? If not then needs further workup. If it did then was probably the antibiotics for H.pylori. If nausea if better lets treat her with pylera. One of the [facility] is holding some samples for her - [facility]  I think. After treatment we need to get stool h.pylori ag to make sure eradicated. Thanks.

## 2016-08-12 NOTE — Telephone Encounter (Signed)
Left message to call back with an update of her symptoms. 

## 2016-08-13 ENCOUNTER — Ambulatory Visit: Payer: Managed Care, Other (non HMO) | Admitting: Internal Medicine

## 2016-08-13 NOTE — Telephone Encounter (Signed)
No answer. I left her a voicemail. The first available appointment is 09/29/16 and to call soon to get an appointment. I will hold the Pylera samples.

## 2016-08-15 NOTE — Telephone Encounter (Signed)
Called back to the patient. Got her voicemail. Left a message suggesting she call back soon and schedule her appointment.

## 2016-08-27 ENCOUNTER — Other Ambulatory Visit: Payer: Self-pay | Admitting: Internal Medicine

## 2016-09-23 ENCOUNTER — Telehealth: Payer: Self-pay

## 2016-09-23 NOTE — Telephone Encounter (Signed)
Letter composed. Please review.

## 2016-09-23 NOTE — Telephone Encounter (Signed)
-----   Message from Willia Craze, NP sent at 09/23/2016  3:28 PM EDT ----- Eustaquio Maize, looks like this patient never returned our call about the H.pylori regimen. She was supposed to come back. Father died of gastric cancer, she should probably get EGD at some point. I see you left her several messages. Can we just mail her a letter to make a follow up appointment?  Thanks  Please convert this to formal documentation for chart. Thanks

## 2016-12-06 ENCOUNTER — Other Ambulatory Visit: Payer: Self-pay | Admitting: Internal Medicine

## 2017-01-29 ENCOUNTER — Ambulatory Visit: Payer: Managed Care, Other (non HMO) | Admitting: Gastroenterology

## 2017-03-09 ENCOUNTER — Encounter: Payer: Self-pay | Admitting: Internal Medicine

## 2017-03-09 ENCOUNTER — Ambulatory Visit (INDEPENDENT_AMBULATORY_CARE_PROVIDER_SITE_OTHER): Payer: 59 | Admitting: Internal Medicine

## 2017-03-09 VITALS — BP 132/70 | HR 72 | Temp 98.2°F | Resp 14 | Ht 59.0 in | Wt 156.4 lb

## 2017-03-09 DIAGNOSIS — Z0000 Encounter for general adult medical examination without abnormal findings: Secondary | ICD-10-CM | POA: Diagnosis not present

## 2017-03-09 DIAGNOSIS — E119 Type 2 diabetes mellitus without complications: Secondary | ICD-10-CM | POA: Diagnosis not present

## 2017-03-09 DIAGNOSIS — A048 Other specified bacterial intestinal infections: Secondary | ICD-10-CM

## 2017-03-09 LAB — TSH: TSH: 1.31 u[IU]/mL (ref 0.35–4.50)

## 2017-03-09 LAB — CBC WITH DIFFERENTIAL/PLATELET
Basophils Absolute: 0 10*3/uL (ref 0.0–0.1)
Basophils Relative: 0.6 % (ref 0.0–3.0)
EOS ABS: 0.1 10*3/uL (ref 0.0–0.7)
EOS PCT: 1.3 % (ref 0.0–5.0)
HEMATOCRIT: 39.4 % (ref 36.0–46.0)
Hemoglobin: 13.3 g/dL (ref 12.0–15.0)
LYMPHS PCT: 41.6 % (ref 12.0–46.0)
Lymphs Abs: 2.4 10*3/uL (ref 0.7–4.0)
MCHC: 33.7 g/dL (ref 30.0–36.0)
MCV: 87.5 fl (ref 78.0–100.0)
MONOS PCT: 7.2 % (ref 3.0–12.0)
Monocytes Absolute: 0.4 10*3/uL (ref 0.1–1.0)
NEUTROS ABS: 2.8 10*3/uL (ref 1.4–7.7)
Neutrophils Relative %: 49.3 % (ref 43.0–77.0)
PLATELETS: 265 10*3/uL (ref 150.0–400.0)
RBC: 4.5 Mil/uL (ref 3.87–5.11)
RDW: 13.7 % (ref 11.5–15.5)
WBC: 5.8 10*3/uL (ref 4.0–10.5)

## 2017-03-09 LAB — COMPREHENSIVE METABOLIC PANEL
ALBUMIN: 4.4 g/dL (ref 3.5–5.2)
ALK PHOS: 74 U/L (ref 39–117)
ALT: 22 U/L (ref 0–35)
AST: 19 U/L (ref 0–37)
BILIRUBIN TOTAL: 0.4 mg/dL (ref 0.2–1.2)
BUN: 15 mg/dL (ref 6–23)
CALCIUM: 9.5 mg/dL (ref 8.4–10.5)
CO2: 24 mEq/L (ref 19–32)
CREATININE: 0.6 mg/dL (ref 0.40–1.20)
Chloride: 108 mEq/L (ref 96–112)
GFR: 133.95 mL/min (ref >60.00)
Glucose, Bld: 113 mg/dL — ABNORMAL HIGH (ref 70–99)
Potassium: 3.8 mEq/L (ref 3.5–5.1)
Sodium: 142 mEq/L (ref 135–145)
TOTAL PROTEIN: 7.7 g/dL (ref 6.0–8.3)

## 2017-03-09 LAB — LIPID PANEL
Cholesterol: 164 mg/dL (ref 0–200)
HDL: 56.6 mg/dL (ref >39.00)
LDL Cholesterol: 90 mg/dL (ref 0–99)
NonHDL: 107.15
TRIGLYCERIDES: 86 mg/dL (ref 0.0–149.0)
Total CHOL/HDL Ratio: 3
VLDL: 17.2 mg/dL (ref 0.0–40.0)

## 2017-03-09 LAB — HEMOGLOBIN A1C: Hgb A1c MFr Bld: 6.8 % — ABNORMAL HIGH (ref 4.6–6.5)

## 2017-03-09 NOTE — Patient Instructions (Signed)
GO TO THE LAB : Get the blood work     GO TO THE FRONT DESK Schedule your next appointment for a  routine visit in 4 months

## 2017-03-09 NOTE — Progress Notes (Signed)
Pre visit review using our clinic review tool, if applicable. No additional management support is needed unless otherwise documented below in the visit note. 

## 2017-03-09 NOTE — Progress Notes (Signed)
Subjective:    Patient ID: Barbara Flores, female    DOB: September 15, 1960, 56 y.o.   MRN: [id number]  DOS:  03/09/2017 Type of visit - description : CPX Interval history: Since the last visit, she was diagnosed with H. pylori, Was partially treated with Biaxin and Flagyl , couldn't finish the treatment due to  nausea. Was not retested, currently asymptomatic.   Review of Systems  denies nausea, vomiting. No heartburn on regular basis   Other than above, a 14 point review of systems is negative     Past Medical History:  Diagnosis Date  . Allergy   . Asthma   . Diabetes mellitus 08/23/2010  . GERD (gastroesophageal reflux disease)   . History of exercise stress test 07-2010   normal except for elevated BP  . Hyperlipidemia   . Hypertension   . Menopause   . Rosacea     Past Surgical History:  Procedure Laterality Date  . Eardrum surgery    . HEMORRHOID SURGERY    . NASAL SINUS SURGERY    . OTHER SURGICAL HISTORY     female surgery after miscarriage, ? D&C or edometrial surgery    Social History   Social History  . Marital status: Married    Spouse name: N/A  . Number of children: 2  . Years of education: N/A   Occupational History  . aviation [employer]   Social History Main Topics  . Smoking status: Never Smoker  . Smokeless tobacco: Never Used  . Alcohol use 0.0 oz/week     Comment: red wine glass per day- stopped for now 08-02-15  . Drug use: No  . Sexual activity: Not on file   Other Topics Concern  . Not on file   Social History Narrative   Household --pt, husband    2 children-- son 29, daughter 80          Family History  Problem Relation Age of Onset  . Diabetes Mother   . Hyperlipidemia Mother   . Hypertension Mother   . Stroke Mother 85  . Stomach cancer Father        deceased  . Breast cancer Sister 29  . Heart attack Neg Hx   . Colon cancer Neg Hx   . Esophageal cancer Neg Hx   . Rectal cancer Neg Hx      Allergies as of 03/09/2017        Reactions   Hydrocodone Nausea Only   Penicillins    vomitting   Amoxicillin Rash      Medication List       Accurate as of 03/09/17 11:59 PM. Always use your most recent med list.          albuterol 108 (90 Base) MCG/ACT inhaler Commonly known as:  VENTOLIN HFA Inhale 2 puffs into the lungs every 6 (six) hours as needed for wheezing.   aspirin 81 MG tablet Take 81 mg by mouth daily.   azelastine 0.1 % nasal spray Commonly known as:  ASTELIN Place 2 sprays into both nostrils at bedtime as needed for rhinitis. Use in each nostril as directed   beclomethasone 40 MCG/ACT inhaler Commonly known as:  QVAR Inhale 2 puffs into the lungs 2 (two) times daily.   FINACEA 15 % cream Generic drug:  Azelaic Acid   fluticasone 50 MCG/ACT nasal spray Commonly known as:  FLONASE Place 2 sprays into both nostrils daily.   ibuprofen 600 MG tablet Commonly known as:  ADVIL,MOTRIN  Take 1 tablet (600 mg total) by mouth every 8 (eight) hours as needed.   loratadine 10 MG tablet Commonly known as:  CLARITIN Take 10 mg by mouth as needed. Reported on 09/10/2015   losartan-hydrochlorothiazide 100-25 MG tablet Commonly known as:  HYZAAR Take 1 tablet by mouth daily.   lubiprostone 24 MCG capsule Commonly known as:  AMITIZA Take 1 capsule (24 mcg total) by mouth 2 (two) times daily with a meal.   ondansetron 8 MG disintegrating tablet Commonly known as:  ZOFRAN ODT Take 1 tablet (8 mg total) by mouth every 8 (eight) hours as needed for nausea or vomiting.   simvastatin 40 MG tablet Commonly known as:  ZOCOR Take 1 tablet (40 mg total) by mouth daily.            Discharge Care Instructions        Start     Ordered   03/09/17 0000  Comp Met (CMET)     03/09/17 0824   03/09/17 0000  Lipid panel     03/09/17 0824   03/09/17 0000  CBC w/Diff     03/09/17 0824   03/09/17 0000  Hemoglobin A1c     03/09/17 0824   03/09/17 0000  TSH     03/09/17 0824   03/09/17 0000  H.  pylori breath test     03/09/17 0824         Objective:   Physical Exam BP 132/70 (BP Location: Left Arm, Patient Position: Sitting, Cuff Size: Small)   Pulse 72   Temp 98.2 F (36.8 C) (Oral)   Resp 14   Ht '4\' 11"'$  (1.499 m)   Wt 156 lb 6 oz (70.9 kg)   SpO2 97%   BMI 31.58 kg/m   General:   Well developed, well nourished . NAD.  Neck: No  thyromegaly  HEENT:  Normocephalic . Face symmetric, atraumatic Lungs:  CTA B Normal respiratory effort, no intercostal retractions, no accessory muscle use. Heart: RRR,  no murmur.  No pretibial edema bilaterally  Abdomen:  Not distended, soft, non-tender. No rebound or rigidity.   Skin: Exposed areas without rash. Not pale. Not jaundice Neurologic:  alert & oriented X3.  Speech normal, gait appropriate for age and unassisted Strength symmetric and appropriate for age.  Psych: Cognition and judgment appear intact.  Cooperative with normal attention span and concentration.  Behavior appropriate. No anxious or depressed appearing.    Assessment & Plan:   Assessmen  Diabetes 2012 HTN Dyslipidemia Asthma Menopause Rosacea GI: --RUQ pain: Ultrasound and HIDA scan negative (02-2015, 03-2015) -- 07/2016--  H Pylori  (+) , partially treated biaxin/flagel, poor tolerance  --constipation  Stress test 2012 neg , elevated BP   PLAN DM: Diet control, check A1c HTN: BP today is very good, ambulatory BPs regularly checked and normal. Continue Hyzaar. Checking labs Dyslipidemia: On simvastatin, check labs Asthma: On Qvar and albuterol as needed Chronic constipation: Takes Amitiza as needed + H. pylori infection: DX 07-2016, took biaxin/Flagyl but couldn't finish the treatment due to nausea. Currently asx, check H. pylori a breathing test. If positive retry antibiotics.  Pt is leaving the country in 2 weeks on vacation. RTC 4 months.

## 2017-03-09 NOTE — Assessment & Plan Note (Addendum)
-  Td 2012; pnm shot 2015, prevnar 2016; had a flu shot last week (02-2017) -CCS:  Colonoscopy 07-2015 (-), next 10 years (iFOB in 5 years?) -Female care per gynecologist    Gyn is  Dr Lisbeth Renshaw , was seen this year, had a MMG 2018 per pt -Diet -- exercise: Discussed -Labs: CMP FLP CBC A1C  TSH

## 2017-03-10 ENCOUNTER — Ambulatory Visit: Payer: Managed Care, Other (non HMO) | Admitting: Family Medicine

## 2017-03-10 LAB — H. PYLORI BREATH TEST: H. pylori Breath Test: NOT DETECTED

## 2017-03-10 NOTE — Assessment & Plan Note (Signed)
DM: Diet control, check A1c HTN: BP today is very good, ambulatory BPs regularly checked and normal. Continue Hyzaar. Checking labs Dyslipidemia: On simvastatin, check labs Asthma: On Qvar and albuterol as needed Chronic constipation: Takes Amitiza as needed + H. pylori infection: DX 07-2016, took biaxin/Flagyl but couldn't finish the treatment due to nausea. Currently asx, check H. pylori a breathing test. If positive retry antibiotics.  Pt is leaving the country in 2 weeks on vacation. RTC 4 months.

## 2017-04-13 ENCOUNTER — Other Ambulatory Visit: Payer: Self-pay | Admitting: Internal Medicine

## 2017-04-16 ENCOUNTER — Ambulatory Visit: Payer: Managed Care, Other (non HMO) | Admitting: Gastroenterology

## 2017-04-28 IMAGING — DX DG SINUSES COMPLETE 3+V
3 series · 3 of 3 positions shown · non-contrast
Comparison: None.

CLINICAL DATA: Right-sided facial pain and headache for the past
week.

EXAM:
PARANASAL SINUSES - COMPLETE 3 + VIEW

[pns waters]
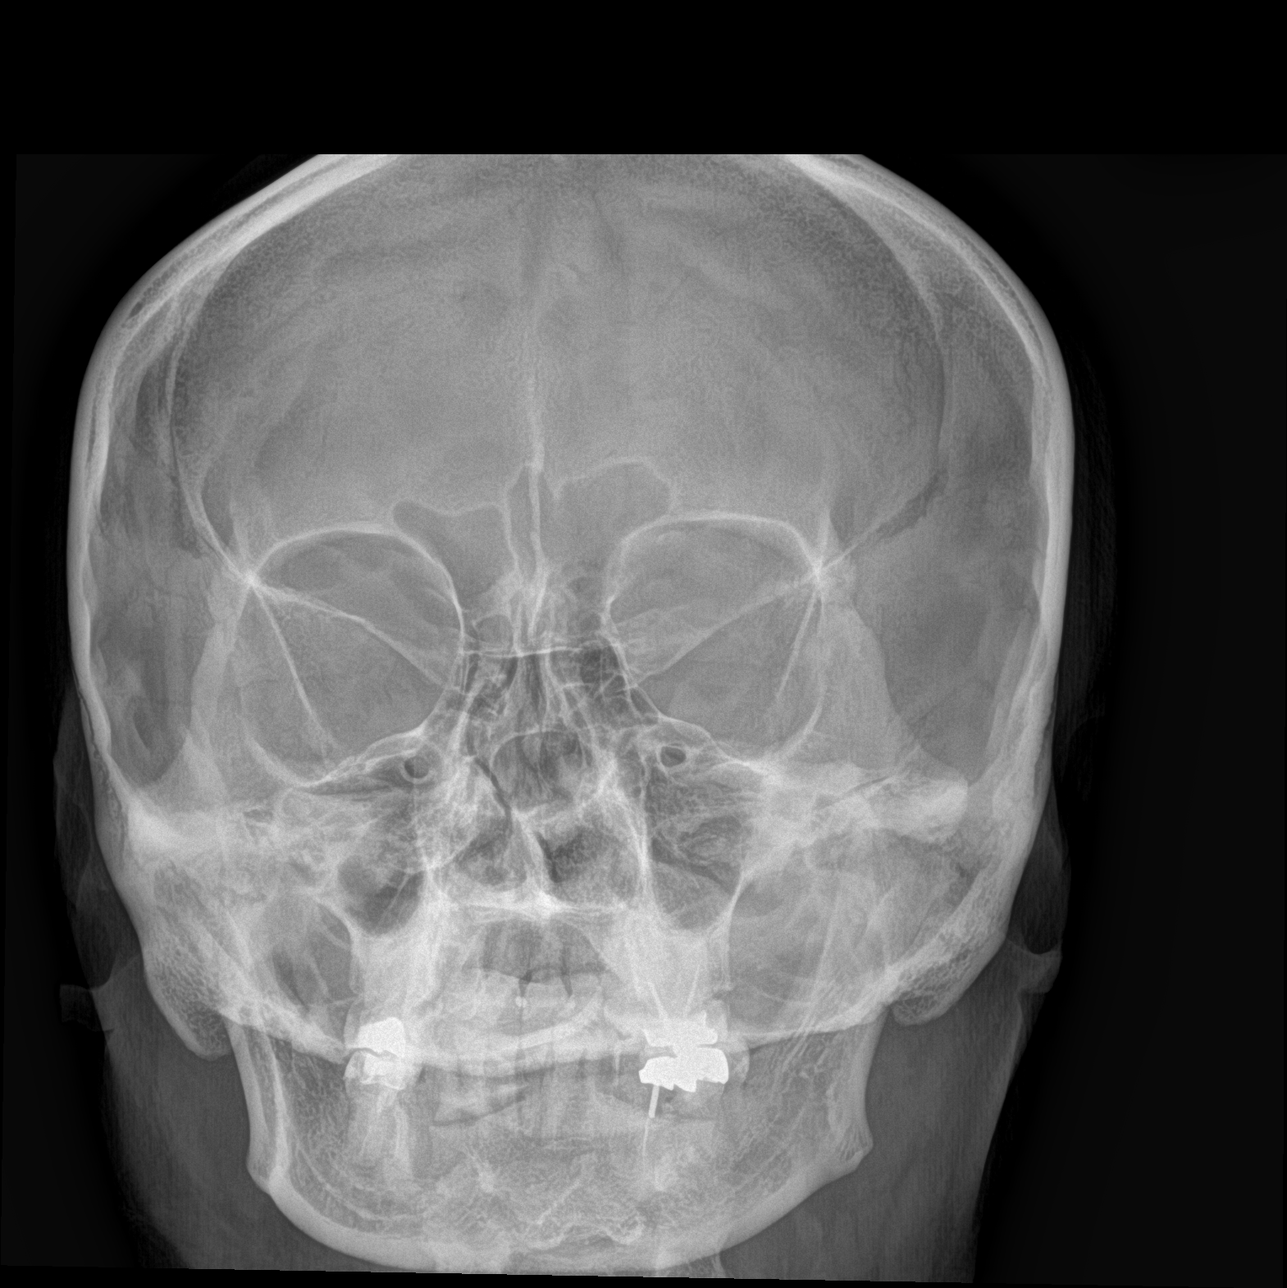

[[person_name]]
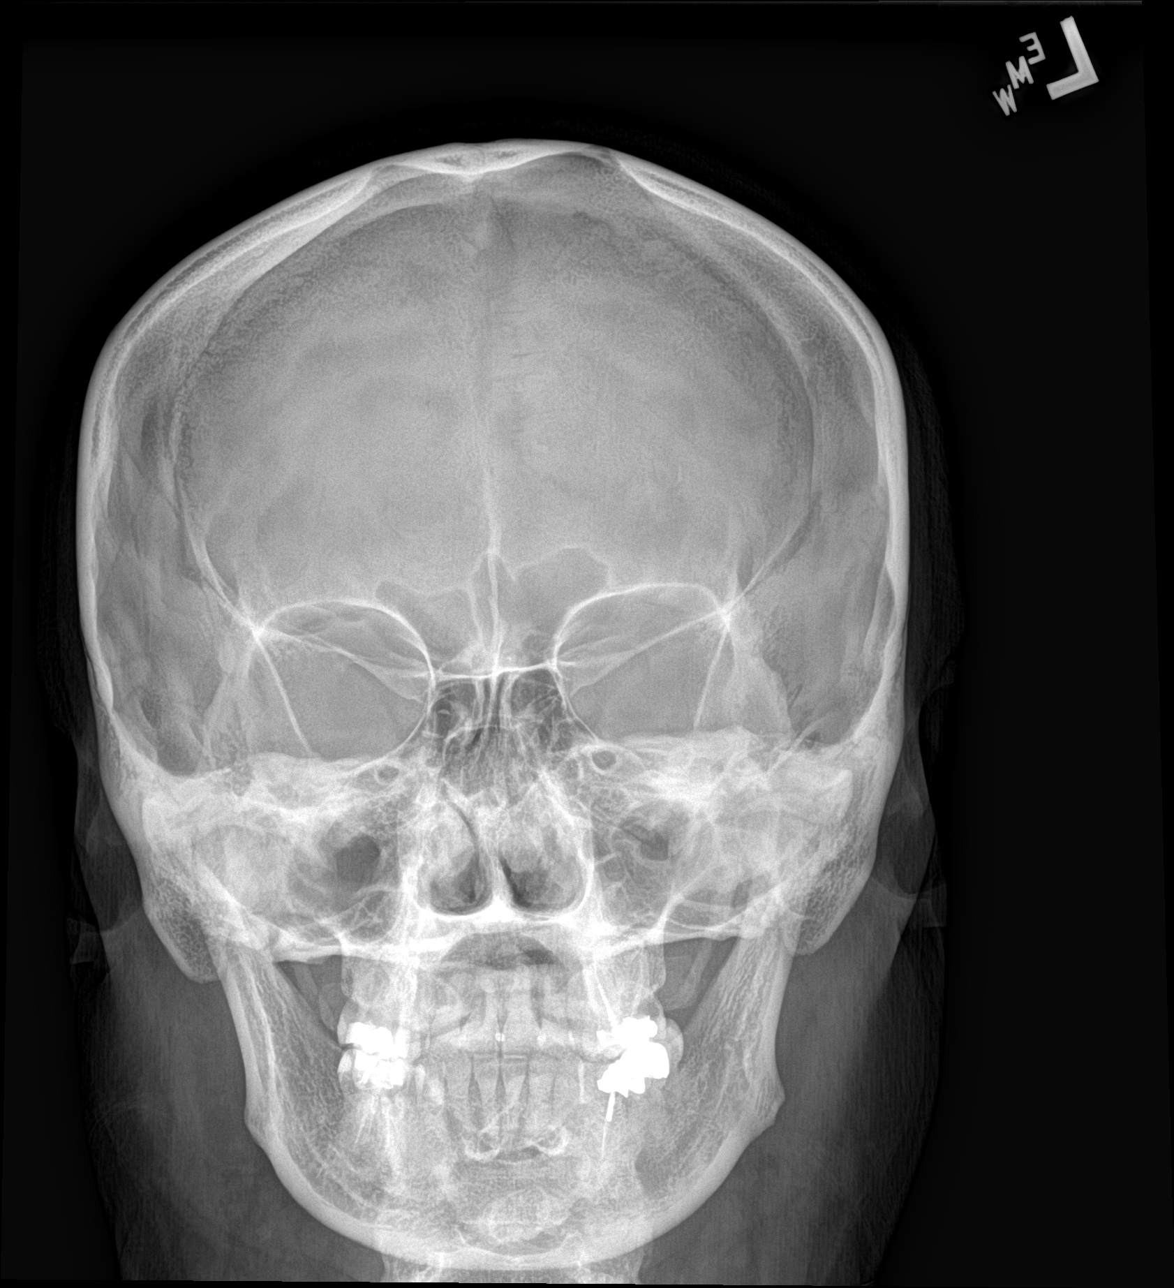

[pns lat]
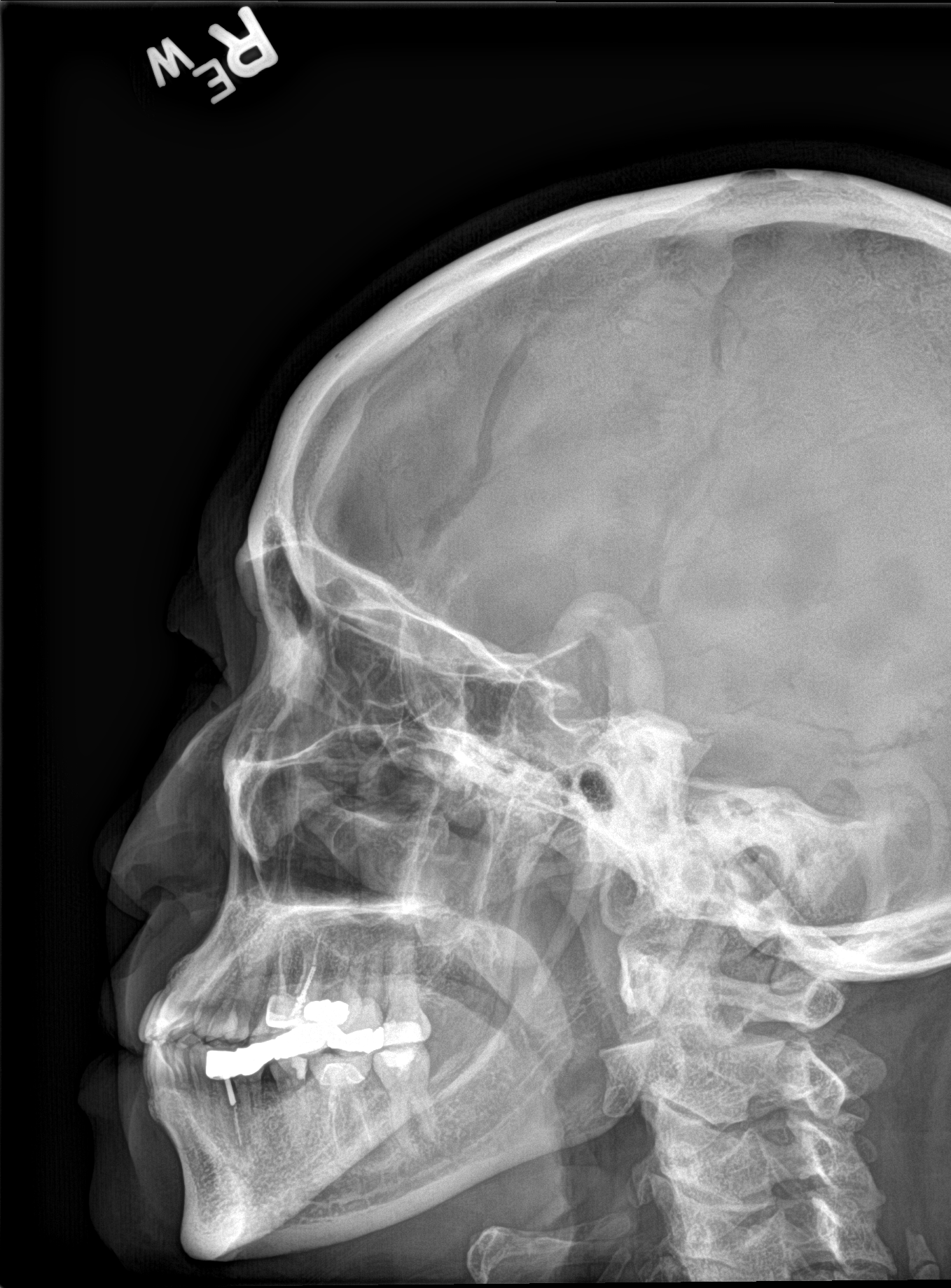

[3 of 3 positions shown; findings below may reference images not displayed]

FINDINGS: Paranasal sinuses appear normally aerated. No definitive sinus wall
thickening or definitive air-fluid levels. There is approximately 5
mm of left-to-right nasal septal deviation.

No definitive displaced facial fracture. Regional soft tissues
appear normal.
IMPRESSION: Unremarkable sinus radiographs, specifically, no definitive
air-fluid levels to suggest acute sinusitis. Further evaluation with
dedicated sinus CT could be performed as clinically indicated

## 2017-05-02 ENCOUNTER — Other Ambulatory Visit: Payer: Self-pay | Admitting: Physician Assistant

## 2017-05-02 DIAGNOSIS — R0982 Postnasal drip: Secondary | ICD-10-CM

## 2017-05-02 DIAGNOSIS — J302 Other seasonal allergic rhinitis: Secondary | ICD-10-CM

## 2017-05-19 ENCOUNTER — Encounter: Payer: Self-pay | Admitting: Gastroenterology

## 2017-05-25 ENCOUNTER — Ambulatory Visit: Payer: 59 | Admitting: Gastroenterology

## 2017-06-10 ENCOUNTER — Ambulatory Visit: Payer: 59 | Admitting: Gastroenterology

## 2017-07-27 ENCOUNTER — Other Ambulatory Visit: Payer: Self-pay | Admitting: Internal Medicine

## 2017-07-28 ENCOUNTER — Telehealth: Payer: Self-pay

## 2017-07-28 MED ORDER — HYDROCHLOROTHIAZIDE 25 MG PO TABS
25.0000 mg | ORAL_TABLET | Freq: Every day | ORAL | 1 refills | Status: DC
Start: 2017-07-28 — End: 2018-02-17

## 2017-07-28 MED ORDER — LOSARTAN POTASSIUM 100 MG PO TABS: 100.0000 mg | ORAL_TABLET | Freq: Every day | ORAL | 1 refills | Status: DC

## 2017-07-28 NOTE — Telephone Encounter (Signed)
Received fax from [facility], losartan-hctz 100-25mg  on back order- requesting to split? Please advise.

## 2017-07-28 NOTE — Telephone Encounter (Signed)
Ok to split: Losartan 100 mg 1 po qd HCTZ 25 mg 1 po qd

## 2017-07-28 NOTE — Telephone Encounter (Signed)
Rxs sent

## 2017-08-06 ENCOUNTER — Ambulatory Visit: Payer: 59 | Admitting: Internal Medicine

## 2017-08-12 ENCOUNTER — Encounter: Payer: Self-pay | Admitting: Internal Medicine

## 2017-08-12 ENCOUNTER — Ambulatory Visit (INDEPENDENT_AMBULATORY_CARE_PROVIDER_SITE_OTHER): Payer: Self-pay | Admitting: Internal Medicine

## 2017-08-12 VITALS — BP 148/80 | HR 91 | Temp 97.9°F | Resp 16 | Ht 59.0 in | Wt 151.6 lb

## 2017-08-12 DIAGNOSIS — J40 Bronchitis, not specified as acute or chronic: Secondary | ICD-10-CM

## 2017-08-12 MED ORDER — DOXYCYCLINE HYCLATE 100 MG PO CAPS: 100.0000 mg | ORAL_CAPSULE | Freq: Two times a day (BID) | ORAL | 0 refills | Status: DC

## 2017-08-12 NOTE — Progress Notes (Signed)
Pre visit review using our clinic review tool, if applicable. No additional management support is needed unless otherwise documented below in the visit note. 

## 2017-08-12 NOTE — Patient Instructions (Signed)
  Rest, fluids , tylenol  For cough:  Take Mucinex DM twice a day as needed until better  For nasal congestion: Use OTC Nasocort or Flonase : 2 nasal sprays on each side of the nose in the morning until you feel better   Take the antibiotic as prescribed  , doxycycline, only if you are not improving in the next 4-5 days  Call if not gradually better over the next  10 days  Call anytime if the symptoms are severe  ====== Schedule your next visit at your convenience   Check the  blood pressure 2 or 3 times a month  Be sure your blood pressure is between 110/65 and  135/85. If it is consistently higher or lower, let me know

## 2017-08-12 NOTE — Progress Notes (Signed)
Subjective:    Patient ID: Barbara Flores, female    DOB: Jul 27, 1960, 57 y.o.   MRN: [id number]  DOS:  08/12/2017 Type of visit - description : acute Interval history: Symptoms started about 4 days ago, has cough with slightly yellow sputum.  Also some chills. Taking over-the-counter medication, name?  BP Readings from Last 3 Encounters:  08/12/17 (!) 148/80  03/09/17 132/70  07/30/16 (!) 160/90     Review of Systems No fever chills No nausea, vomiting or diarrhea. + Sinus pressure, congestion and clear nasal discharge. Denies any major headaches or muscle aches.  Asthma is well controlled.  Past Medical History:  Diagnosis Date  . Allergy   . Asthma   . Diabetes mellitus 08/23/2010  . GERD (gastroesophageal reflux disease)   . History of exercise stress test 07-2010   normal except for elevated BP  . Hyperlipidemia   . Hypertension   . Menopause   . Rosacea     Past Surgical History:  Procedure Laterality Date  . Eardrum surgery    . HEMORRHOID SURGERY    . NASAL SINUS SURGERY    . OTHER SURGICAL HISTORY     female surgery after miscarriage, ? D&C or edometrial surgery    Social History   Socioeconomic History  . Marital status: Married    Spouse name: Not on file  . Number of children: 2  . Years of education: Not on file  . Highest education level: Not on file  Social Needs  . Financial resource strain: Not on file  . Food insecurity - worry: Not on file  . Food insecurity - inability: Not on file  . Transportation needs - medical: Not on file  . Transportation needs - non-medical: Not on file  Occupational History  . Occupation: Health visitor: [employer]  Tobacco Use  . Smoking status: Never Smoker  . Smokeless tobacco: Never Used  Substance and Sexual Activity  . Alcohol use: Yes    Alcohol/week: 0.0 oz    Comment: red wine glass per day- stopped for now 08-02-15  . Drug use: No  . Sexual activity: Not on file  Other Topics Concern  . Not on  file  Social History Narrative   Household --pt, husband    2 children-- son 74, daughter 54        Allergies as of 08/12/2017      Reactions   Hydrocodone Nausea Only   Penicillins    vomitting   Amoxicillin Rash      Medication List        Accurate as of 08/12/17 11:59 PM. Always use your most recent med list.          albuterol 108 (90 Base) MCG/ACT inhaler Commonly known as:  VENTOLIN HFA Inhale 2 puffs into the lungs every 6 (six) hours as needed for wheezing.   aspirin 81 MG tablet Take 81 mg by mouth daily.   azelastine 0.1 % nasal spray Commonly known as:  ASTELIN Place 2 sprays into both nostrils at bedtime as needed for rhinitis. Use in each nostril as directed   beclomethasone 40 MCG/ACT inhaler Commonly known as:  QVAR Inhale 2 puffs into the lungs 2 (two) times daily.   doxycycline 100 MG capsule Commonly known as:  VIBRAMYCIN Take 1 capsule (100 mg total) by mouth 2 (two) times daily.   FINACEA 15 % cream Generic drug:  Azelaic Acid   fluticasone 50 MCG/ACT nasal spray Commonly known  as:  FLONASE Place 2 sprays into both nostrils daily.   hydrochlorothiazide 25 MG tablet Commonly known as:  HYDRODIURIL Take 1 tablet (25 mg total) by mouth daily.   ibuprofen 600 MG tablet Commonly known as:  ADVIL,MOTRIN Take 1 tablet (600 mg total) by mouth every 8 (eight) hours as needed.   loratadine 10 MG tablet Commonly known as:  CLARITIN Take 10 mg by mouth as needed. Reported on 09/10/2015   losartan 100 MG tablet Commonly known as:  COZAAR Take 1 tablet (100 mg total) by mouth daily.   lubiprostone 24 MCG capsule Commonly known as:  AMITIZA Take 1 capsule (24 mcg total) by mouth 2 (two) times daily with a meal.   ondansetron 8 MG disintegrating tablet Commonly known as:  ZOFRAN ODT Take 1 tablet (8 mg total) by mouth every 8 (eight) hours as needed for nausea or vomiting.   simvastatin 40 MG tablet Commonly known as:  ZOCOR Take 1  tablet (40 mg total) by mouth daily.          Objective:   Physical Exam BP (!) 148/80 (BP Location: Right Arm, Patient Position: Sitting, Cuff Size: Normal)   Pulse 91   Temp 97.9 F (36.6 C) (Oral)   Resp 16   Ht 4\' 11"  (1.499 m)   Wt 151 lb 9.6 oz (68.8 kg)   SpO2 98%   BMI 30.62 kg/m  General:   Well developed, well nourished . NAD.  HEENT:  Normocephalic . Face symmetric, atraumatic.  TMs normal, throat symmetric, no red, nose is slightly congested. Lungs:  CTA B Normal respiratory effort, no intercostal retractions, no accessory muscle use. Heart: RRR,  no murmur.  No pretibial edema bilaterally  Skin: Not pale. Not jaundice Neurologic:  alert & oriented X3.  Speech normal, gait appropriate for age and unassisted Psych--  Cognition and judgment appear intact.  Cooperative with normal attention span and concentration.  Behavior appropriate. No anxious or depressed appearing.      Assessment & Plan:   Assessment  Diabetes 2012 HTN Dyslipidemia Asthma Menopause Rosacea GI: --RUQ pain: Ultrasound and HIDA scan negative (0-9381, 03-2015) -- 07/2016--  H Pylori  (+) , partially treated biaxin/flagel, poor tolerance  --constipation  Stress test 2012 neg , elevated BP  PLAN Mild bronchitis: sx c/w mild bronchitis, asthma is not exacerbated. Rx supportive treatment first, if not better start doxycycline.  See AVS. Food allergy:  reports that she ate crab, previously she was told she was intolerant, developed mild "throat itching".  Recommend complete avoidance. Having issues with insurance, won't be able to be seen in the next few weeks, recommend to come back at her convenience.

## 2017-08-13 NOTE — Assessment & Plan Note (Signed)
Mild bronchitis: sx c/w mild bronchitis, asthma is not exacerbated. Rx supportive treatment first, if not better start doxycycline.  See AVS. Food allergy:  reports that she ate crab, previously she was told she was intolerant, developed mild "throat itching".  Recommend complete avoidance. Having issues with insurance, won't be able to be seen in the next few weeks, recommend to come back at her convenience.

## 2018-01-01 ENCOUNTER — Ambulatory Visit: Payer: Self-pay | Admitting: Family Medicine

## 2018-02-12 ENCOUNTER — Other Ambulatory Visit: Payer: Self-pay | Admitting: Internal Medicine

## 2018-02-17 ENCOUNTER — Other Ambulatory Visit: Payer: Self-pay

## 2018-02-17 MED ORDER — LOSARTAN POTASSIUM 100 MG PO TABS: 100.0000 mg | ORAL_TABLET | Freq: Every day | ORAL | 1 refills | Status: DC

## 2018-02-17 MED ORDER — HYDROCHLOROTHIAZIDE 25 MG PO TABS: 25.0000 mg | ORAL_TABLET | Freq: Every day | ORAL | 1 refills | Status: DC

## 2018-04-02 DIAGNOSIS — Z6831 Body mass index (BMI) 31.0-31.9, adult: Secondary | ICD-10-CM | POA: Diagnosis not present

## 2018-04-02 DIAGNOSIS — Z1231 Encounter for screening mammogram for malignant neoplasm of breast: Secondary | ICD-10-CM | POA: Diagnosis not present

## 2018-04-02 DIAGNOSIS — Z01419 Encounter for gynecological examination (general) (routine) without abnormal findings: Secondary | ICD-10-CM | POA: Diagnosis not present

## 2018-04-02 LAB — HM MAMMOGRAPHY

## 2018-04-04 ENCOUNTER — Other Ambulatory Visit: Payer: Self-pay | Admitting: Internal Medicine

## 2018-04-08 ENCOUNTER — Encounter: Payer: Self-pay | Admitting: Internal Medicine

## 2018-06-11 ENCOUNTER — Encounter: Payer: Self-pay | Admitting: Internal Medicine

## 2018-07-12 ENCOUNTER — Ambulatory Visit (INDEPENDENT_AMBULATORY_CARE_PROVIDER_SITE_OTHER): Payer: BLUE CROSS/BLUE SHIELD | Admitting: Internal Medicine

## 2018-07-12 ENCOUNTER — Other Ambulatory Visit: Payer: Self-pay | Admitting: Internal Medicine

## 2018-07-12 ENCOUNTER — Encounter: Payer: Self-pay | Admitting: Internal Medicine

## 2018-07-12 VITALS — BP 124/68 | HR 87 | Temp 98.0°F | Resp 16 | Ht 59.0 in | Wt 151.2 lb

## 2018-07-12 DIAGNOSIS — Z0000 Encounter for general adult medical examination without abnormal findings: Secondary | ICD-10-CM | POA: Diagnosis not present

## 2018-07-12 DIAGNOSIS — E119 Type 2 diabetes mellitus without complications: Secondary | ICD-10-CM

## 2018-07-12 LAB — CBC WITH DIFFERENTIAL/PLATELET
BASOS PCT: 0.8 % (ref 0.0–3.0)
Basophils Absolute: 0 10*3/uL (ref 0.0–0.1)
Eosinophils Absolute: 0.1 10*3/uL (ref 0.0–0.7)
Eosinophils Relative: 2.2 % (ref 0.0–5.0)
HEMATOCRIT: 40.5 % (ref 36.0–46.0)
Hemoglobin: 13.9 g/dL (ref 12.0–15.0)
LYMPHS PCT: 31.2 % (ref 12.0–46.0)
Lymphs Abs: 2 10*3/uL (ref 0.7–4.0)
MCHC: 34.2 g/dL (ref 30.0–36.0)
MCV: 86.1 fl (ref 78.0–100.0)
MONOS PCT: 9.3 % (ref 3.0–12.0)
Monocytes Absolute: 0.6 10*3/uL (ref 0.1–1.0)
NEUTROS ABS: 3.6 10*3/uL (ref 1.4–7.7)
Neutrophils Relative %: 56.5 % (ref 43.0–77.0)
PLATELETS: 233 10*3/uL (ref 150.0–400.0)
RBC: 4.7 Mil/uL (ref 3.87–5.11)
RDW: 14.1 % (ref 11.5–15.5)
WBC: 6.4 10*3/uL (ref 4.0–10.5)

## 2018-07-12 LAB — COMPREHENSIVE METABOLIC PANEL
ALBUMIN: 4.7 g/dL (ref 3.5–5.2)
ALT: 16 U/L (ref 0–35)
AST: 16 U/L (ref 0–37)
Alkaline Phosphatase: 95 U/L (ref 39–117)
BILIRUBIN TOTAL: 0.4 mg/dL (ref 0.2–1.2)
BUN: 7 mg/dL (ref 6–23)
CALCIUM: 9.7 mg/dL (ref 8.4–10.5)
CHLORIDE: 105 meq/L (ref 96–112)
CO2: 22 meq/L (ref 19–32)
CREATININE: 0.66 mg/dL (ref 0.40–1.20)
GFR: 112.34 mL/min (ref >60.00)
Glucose, Bld: 100 mg/dL — ABNORMAL HIGH (ref 70–99)
Potassium: 3.7 mEq/L (ref 3.5–5.1)
Sodium: 139 mEq/L (ref 135–145)
Total Protein: 7.8 g/dL (ref 6.0–8.3)

## 2018-07-12 LAB — LIPID PANEL
CHOL/HDL RATIO: 3
Cholesterol: 172 mg/dL (ref 0–200)
HDL: 59.2 mg/dL (ref >39.00)
LDL Cholesterol: 92 mg/dL (ref 0–99)
NonHDL: 112.94
TRIGLYCERIDES: 106 mg/dL (ref 0.0–149.0)
VLDL: 21.2 mg/dL (ref 0.0–40.0)

## 2018-07-12 LAB — HEMOGLOBIN A1C: Hgb A1c MFr Bld: 6.7 % — ABNORMAL HIGH (ref 4.6–6.5)

## 2018-07-12 LAB — TSH: TSH: 1.43 u[IU]/mL (ref 0.35–4.50)

## 2018-07-12 NOTE — Assessment & Plan Note (Addendum)
-  Td 2012; pnm shot 2015, prevnar 2016; had a flu shot  -CCS:  Colonoscopy 07-2015 (-), next 10 years (iFOB in 5 years?) -Female care per gynecologist    Saw Gyn October 2019, Dr Lisbeth Renshaw , had a MMG 03/2018 per [facility] -Diet: Has improved significantly, she would like to do even better, recommend to visit the ADA website, think about a Mediterranean diet and also information about the wellness center provided. -Exercise: Very active  at work. -labs CMP, lipid panel, CBC, A1c, TSH (not fasting) -Bone health: Menopause at age 58, no family history of osteoporosis, no history of fractures.  Per guidelines okay to RX DEXA 5 years after menopause.

## 2018-07-12 NOTE — Progress Notes (Signed)
Subjective:    Patient ID: Barbara Flores, female    DOB: 1960-08-19, 58 y.o.   MRN: [id number]  DOS:  07/12/2018 Type of visit - description: Complete physical exam She had lost her mother few months ago and her brother 2 months ago.  It has been very difficult but she is doing okay.  Brother had massive MI, age 9.  Review of Systems  Other than above, a 14 point review of systems is negative    Past Medical History:  Diagnosis Date  . Allergy   . Asthma   . Diabetes mellitus 08/23/2010  . GERD (gastroesophageal reflux disease)   . History of exercise stress test 07-2010   normal except for elevated BP  . Hyperlipidemia   . Hypertension   . Menopause   . Rosacea     Past Surgical History:  Procedure Laterality Date  . Eardrum surgery    . HEMORRHOID SURGERY    . NASAL SINUS SURGERY    . OTHER SURGICAL HISTORY     female surgery after miscarriage, ? D&C or edometrial surgery    Social History   Socioeconomic History  . Marital status: Married    Spouse name: Not on file  . Number of children: 2  . Years of education: Not on file  . Highest education level: Not on file  Occupational History  . Occupation: Health visitor: [employer]  . Financial resource strain: Not on file  . Food insecurity:    Worry: Not on file    Inability: Not on file  . Transportation needs:    Medical: Not on file    Non-medical: Not on file  Tobacco Use  . Smoking status: Never Smoker  . Smokeless tobacco: Never Used  Substance and Sexual Activity  . Alcohol use: Yes    Alcohol/week: 0.0 standard drinks    Comment: red wine glass rarely   . Drug use: No  . Sexual activity: Not on file  Lifestyle  . Physical activity:    Days per week: Not on file    Minutes per session: Not on file  . Stress: Not on file  Relationships  . Social connections:    Talks on phone: Not on file    Gets together: Not on file    Attends religious service: Not on file    Active  member of club or organization: Not on file    Attends meetings of clubs or organizations: Not on file    Relationship status: Not on file  . Intimate partner violence:    Fear of current or ex partner: Not on file    Emotionally abused: Not on file    Physically abused: Not on file    Forced sexual activity: Not on file  Other Topics Concern  . Not on file  Social History Narrative   Household --pt, husband    2 children-- son 59, daughter 88       Family History  Problem Relation Age of Onset  . Diabetes Mother   . Hyperlipidemia Mother   . Hypertension Mother   . Stroke Mother 61  . Kidney failure Mother   . Stomach cancer Father        deceased  . Breast cancer Sister 53  . Heart attack Brother 47  . Colon cancer Neg Hx   . Esophageal cancer Neg Hx   . Rectal cancer Neg Hx      Allergies as  of 07/12/2018      Reactions   Crab [shellfish Allergy]    Hydrocodone Nausea Only   Penicillins    vomitting   Amoxicillin Rash      Medication List       Accurate as of July 12, 2018 11:59 PM. Always use your most recent med list.        albuterol 108 (90 Base) MCG/ACT inhaler Commonly known as:  VENTOLIN HFA Inhale 2 puffs into the lungs every 6 (six) hours as needed for wheezing.   aspirin 81 MG tablet Take 81 mg by mouth daily.   azelastine 0.1 % nasal spray Commonly known as:  ASTELIN Place 2 sprays into both nostrils at bedtime as needed for rhinitis. Use in each nostril as directed   beclomethasone 40 MCG/ACT inhaler Commonly known as:  QVAR Inhale 2 puffs into the lungs 2 (two) times daily.   FINACEA 15 % cream Generic drug:  Azelaic Acid   fluticasone 50 MCG/ACT nasal spray Commonly known as:  FLONASE Place 2 sprays into both nostrils daily.   hydrochlorothiazide 25 MG tablet Commonly known as:  HYDRODIURIL Take 1 tablet (25 mg total) by mouth daily.   ibuprofen 600 MG tablet Commonly known as:  ADVIL,MOTRIN Take 1 tablet (600 mg  total) by mouth every 8 (eight) hours as needed.   loratadine 10 MG tablet Commonly known as:  CLARITIN Take 10 mg by mouth as needed. Reported on 09/10/2015   losartan 100 MG tablet Commonly known as:  COZAAR Take 1 tablet (100 mg total) by mouth daily.   ondansetron 8 MG disintegrating tablet Commonly known as:  ZOFRAN ODT Take 1 tablet (8 mg total) by mouth every 8 (eight) hours as needed for nausea or vomiting.   simvastatin 40 MG tablet Commonly known as:  ZOCOR Take 1 tablet (40 mg total) by mouth daily.           Objective:   Physical Exam BP 124/68 (BP Location: Left Arm, Patient Position: Sitting, Cuff Size: Small)   Pulse 87   Temp 98 F (36.7 C) (Oral)   Resp 16   Ht 4\' 11"  (1.499 m)   Wt 151 lb 4 oz (68.6 kg)   SpO2 98%   BMI 30.55 kg/m  General: Well developed, NAD, BMI noted Neck: No  thyromegaly  HEENT:  Normocephalic . Face symmetric, atraumatic Lungs:  CTA B Normal respiratory effort, no intercostal retractions, no accessory muscle use. Heart: RRR,  no murmur.  No pretibial edema bilaterally  Abdomen:  Not distended, soft, non-tender. No rebound or rigidity.   DIABETIC FEET EXAM: No lower extremity edema Normal pedal pulses bilaterally Skin normal, nails normal, no calluses Pinprick examination of the feet normal. Neurologic:  alert & oriented X3.  Speech normal, gait appropriate for age and unassisted Strength symmetric and appropriate for age.  Psych: Cognition and judgment appear intact.  Cooperative with normal attention span and concentration.  Behavior appropriate. No anxious or depressed appearing.     Assessment     Assessment  Diabetes 2012 HTN Dyslipidemia Asthma Menopause at age 59 Rosacea GI: --RUQ pain: Ultrasound and HIDA scan negative (02-2015, 03-2015) -- 07/2016--  H Pylori  (+) , partially treated biaxin/flagel, poor tolerance  --constipation  Stress test 2012 neg , elevated BP +FH CAD brother MI 6  y/o  PLAN DM: Diet controlled, has improved her eating habits, she remains active.  Feet exam normal, check a A1c. HTN: On losartan, has been skipping  HCTZ frequently because BPs is very good.  Will leave HCTZ in her list in case she needs it again; advised not to take unless BPs are elevated High cholesterol: On simvastatin, not fasting today, checking labs Asthma: Not using any inhalers, no symptoms. Constipation: Not needing Amitiza, doing well. RTC 6 months

## 2018-07-12 NOTE — Progress Notes (Signed)
Pre visit review using our clinic review tool, if applicable. No additional management support is needed unless otherwise documented below in the visit note. 

## 2018-07-12 NOTE — Patient Instructions (Addendum)
Per our records: you are due for an eye exam- please contact your eye doctor for an appointment. Please have them send [place] the office visit notes- our fax number is [phone]   GO TO THE LAB : Get the blood work     [place] Schedule your next appointment for a six-month follow-up     Check the  blood pressure 2 or 3 times a month   Be sure your blood pressure is between 110/65 and  135/85. If it is consistently higher or lower, let me know    Diet: Please visit the American diabetes Association website Consider the [place] diet . Those who follow the Mediterranean diet have a reduced risk of heart disease  . The diet is associated with a reduced incidence of Parkinson's and Alzheimer's diseases . People following the diet may have longer life expectancies and lower rates of chronic diseases  . The Dietary Guidelines for Americans recommends the Mediterranean diet as an eating plan to promote health and prevent disease  What Is the Mediterranean Diet?  . Healthy eating plan based on typical foods and recipes of Mediterranean-style cooking . The diet is primarily a plant based diet; these foods should make up a majority of meals   Starches - Plant based foods should make up a majority of meals - They are an important sources of vitamins, minerals, energy, antioxidants, and fiber - Choose whole grains, foods high in fiber and minimally processed items  - Typical grain sources include wheat, oats, barley, corn, brown rice, bulgar, farro, millet, polenta, couscous  - Various types of beans include chickpeas, lentils, fava beans, black beans, white beans   Fruits  Veggies - Large quantities of antioxidant rich fruits & veggies; 6 or more servings  - Vegetables can be eaten raw or lightly drizzled with oil and cooked  - Vegetables common to the traditional Mediterranean Diet include: artichokes, arugula, beets, broccoli, brussel sprouts,  cabbage, carrots, celery, collard greens, cucumbers, eggplant, kale, leeks, lemons, lettuce, mushrooms, okra, onions, peas, peppers, potatoes, pumpkin, radishes, rutabaga, shallots, spinach, sweet potatoes, turnips, zucchini - Fruits common to the Mediterranean Diet include: apples, apricots, avocados, cherries, clementines, dates, figs, grapefruits, grapes, melons, nectarines, oranges, peaches, pears, pomegranates, strawberries, tangerines  Fats - Replace butter and margarine with healthy oils, such as olive oil, canola oil, and tahini  - Limit nuts to no more than a handful a day  - Nuts include walnuts, almonds, pecans, pistachios, pine nuts  - Limit or avoid candied, honey roasted or heavily salted nuts - Olives are central to the [employer] - can be eaten whole or used in a variety of dishes   Meats Protein - Limiting red meat: no more than a few times a month - When eating red meat: choose lean cuts and keep the portion to the size of deck of cards - Eggs: approx. 0 to 4 times a week  - Fish and lean poultry: at least 2 a week  - Healthy protein sources include, chicken, [place], lean beef, lamb - Increase intake of seafood such as tuna, salmon, trout, mackerel, shrimp, scallops - Avoid or limit high fat processed meats such as sausage and bacon  Dairy - Include moderate amounts of low fat dairy products  - Focus on healthy dairy such as fat free yogurt, skim milk, low or reduced fat cheese - Limit dairy products higher in fat such as whole or 2%  milk, cheese, ice cream  Alcohol - Moderate amounts of red wine is ok  - No more than 5 oz daily for women (all ages) and men older than age 24  - No more than 10 oz of wine daily for men younger than 50  Other - Limit sweets and other desserts  - Use herbs and spices instead of salt to flavor foods  - Herbs and spices common to the traditional Mediterranean Diet include: basil, bay leaves, chives, cloves, cumin, fennel, garlic, lavender,  marjoram, mint, oregano, parsley, pepper, rosemary, sage, savory, sumac, tarragon, thyme   It's not just a diet, it's a lifestyle:  . The Mediterranean diet includes lifestyle factors typical of those in the region  . Foods, drinks and meals are best eaten with others and savored . Daily physical activity is important for overall good health . This could be strenuous exercise like running and aerobics . This could also be more leisurely activities such as walking, housework, yard-work, or taking the stairs . Moderation is the key; a balanced and healthy diet accommodates most foods and drinks . Consider portion sizes and frequency of consumption of certain foods   Meal Ideas & Options:  . Breakfast:  o Whole wheat toast or whole wheat English muffins with peanut butter & hard boiled egg o Steel cut oats topped with apples & cinnamon and skim milk  o Fresh fruit: banana, strawberries, melon, berries, peaches  o Smoothies: strawberries, bananas, greek yogurt, peanut butter o Low fat greek yogurt with blueberries and granola  o Egg white omelet with spinach and mushrooms o Breakfast couscous: whole wheat couscous, apricots, skim milk, cranberries  . Sandwiches:  o Hummus and grilled vegetables (peppers, zucchini, squash) on whole wheat bread   o Grilled chicken on whole wheat pita with lettuce, tomatoes, cucumbers or tzatziki  o Tuna salad on whole wheat bread: tuna salad made with greek yogurt, olives, red peppers, capers, green onions o Garlic rosemary lamb pita: lamb sauted with garlic, rosemary, salt & pepper; add lettuce, cucumber, greek yogurt to pita - flavor with lemon juice and black pepper  . Seafood:  o Mediterranean grilled salmon, seasoned with garlic, basil, parsley, lemon juice and black pepper o Shrimp, lemon, and spinach whole-grain pasta salad made with low fat greek yogurt  o Seared scallops with lemon orzo  o Seared tuna steaks seasoned salt, pepper, coriander topped  with tomato mixture of olives, tomatoes, olive oil, minced garlic, parsley, green onions and cappers  . Meats:  o Herbed greek chicken salad with kalamata olives, cucumber, feta  o Red bell peppers stuffed with spinach, bulgur, lean ground beef (or lentils) & topped with feta   o Kebabs: skewers of chicken, tomatoes, onions, zucchini, squash  o [place] burgers: made with red onions, mint, dill, lemon juice, feta cheese topped with roasted red peppers . Vegetarian o Cucumber salad: cucumbers, artichoke hearts, celery, red onion, feta cheese, tossed in olive oil & lemon juice  o Hummus and whole grain pita points with a greek salad (lettuce, tomato, feta, olives, cucumbers, red onion) o Lentil soup with celery, carrots made with vegetable broth, garlic, salt and pepper  o Tabouli salad: parsley, bulgur, mint, scallions, cucumbers, tomato, radishes, lemon juice, olive oil, salt and pepper.

## 2018-07-13 NOTE — Assessment & Plan Note (Signed)
DM: Diet controlled, has improved her eating habits, she remains active.  Feet exam normal, check a A1c. HTN: On losartan, has been skipping HCTZ frequently because BPs is very good.  Will leave HCTZ in her list in case she needs it again; advised not to take unless BPs are elevated High cholesterol: On simvastatin, not fasting today, checking labs Asthma: Not using any inhalers, no symptoms. Constipation: Not needing Amitiza, doing well. RTC 6 months

## 2018-08-26 ENCOUNTER — Encounter: Payer: Self-pay | Admitting: Internal Medicine

## 2018-08-26 ENCOUNTER — Ambulatory Visit (INDEPENDENT_AMBULATORY_CARE_PROVIDER_SITE_OTHER): Payer: BLUE CROSS/BLUE SHIELD | Admitting: Family Medicine

## 2018-08-26 ENCOUNTER — Other Ambulatory Visit: Payer: Self-pay

## 2018-08-26 ENCOUNTER — Encounter: Payer: Self-pay | Admitting: Family Medicine

## 2018-08-26 DIAGNOSIS — J302 Other seasonal allergic rhinitis: Secondary | ICD-10-CM | POA: Insufficient documentation

## 2018-08-26 DIAGNOSIS — J4520 Mild intermittent asthma, uncomplicated: Secondary | ICD-10-CM | POA: Diagnosis not present

## 2018-08-26 DIAGNOSIS — R0982 Postnasal drip: Secondary | ICD-10-CM | POA: Diagnosis not present

## 2018-08-26 MED ORDER — ALBUTEROL SULFATE HFA 108 (90 BASE) MCG/ACT IN AERS: 2.0000 | INHALATION_SPRAY | Freq: Four times a day (QID) | RESPIRATORY_TRACT | 6 refills | Status: DC | PRN

## 2018-08-26 MED ORDER — FLUTICASONE PROPIONATE 50 MCG/ACT NA SUSP: 2.0000 | Freq: Every day | NASAL | 6 refills | Status: DC

## 2018-08-26 MED ORDER — IPRATROPIUM-ALBUTEROL 0.5-2.5 (3) MG/3ML IN SOLN
3.0000 mL | Freq: Once | RESPIRATORY_TRACT | Status: AC
  Administered 2018-08-26: 3 mL via RESPIRATORY_TRACT

## 2018-08-26 MED ORDER — FLUTICASONE PROPIONATE HFA 110 MCG/ACT IN AERO: 2.0000 | INHALATION_SPRAY | Freq: Two times a day (BID) | RESPIRATORY_TRACT | 1 refills | Status: DC

## 2018-08-26 MED ORDER — LEVOCETIRIZINE DIHYDROCHLORIDE 5 MG PO TABS: 5.0000 mg | ORAL_TABLET | Freq: Every evening | ORAL | 3 refills | Status: DC

## 2018-08-26 NOTE — Progress Notes (Signed)
Chief Complaint  Patient presents with  . Allergies    Subjective: Patient is a 58 y.o. female here for allergies.  Patient has a history of both allergies and asthma.  Over the past 2 weeks since the pollen has started to come, she has been experiencing more congestion, itchy/watery eyes, shortness of breath, wheezing, and itchy throat.  She does not have any up-to-date inhalers.  Patient has not been using anything for her allergies either.  ROS:  Constitutional: Denies fevers Lungs: + Wheezing  Past Medical History:  Diagnosis Date  . Allergy   . Asthma   . Diabetes mellitus 08/23/2010  . GERD (gastroesophageal reflux disease)   . History of exercise stress test 07-2010   normal except for elevated BP  . Hyperlipidemia   . Hypertension   . Menopause   . Rosacea     Objective: BP (!) 142/80 (BP Location: Left Arm, Patient Position: Sitting, Cuff Size: Normal)   Pulse 91   Temp 98.3 F (36.8 C) (Oral)   Ht 4\' 11"  (1.499 m)   Wt 150 lb (68 kg)   SpO2 98%   BMI 30.30 kg/m  General: Awake, appears stated age HEENT: MMM, EOMi, ears and nose neg Heart: RRR, no murmurs Lungs: CTAB, no rales, wheezes or rhonchi.  Decreased airflow throughout.  No accessory muscle use Psych: Age appropriate judgment and insight, normal affect and mood  Assessment and Plan: Mild intermittent asthma without complication - Plan: albuterol (VENTOLIN HFA) 108 (90 Base) MCG/ACT inhaler, fluticasone (FLOVENT HFA) 110 MCG/ACT inhaler, ipratropium-albuterol (DUONEB) 0.5-2.5 (3) MG/3ML nebulizer solution 3 mL  Acute seasonal allergic rhinitis - Plan: fluticasone (FLONASE) 50 MCG/ACT nasal spray, ipratropium-albuterol (DUONEB) 0.5-2.5 (3) MG/3ML nebulizer solution 3 mL  PND (post-nasal drip) - Plan: fluticasone (FLONASE) 50 MCG/ACT nasal spray, ipratropium-albuterol (DUONEB) 0.5-2.5 (3) MG/3ML nebulizer solution 3 mL  For asthma, start rescue inhaler as needed.  DuoNeb today.  Flovent to use if her  asthma flares.  Reminded to rinse mouth out after each use. Start intranasal corticosteroid in addition to oral antihistamine. Follow-up in 1 month with regular PCP. The patient voiced understanding and agreement to the plan.  Hopkins, DO 08/26/18  4:52 PM

## 2018-08-26 NOTE — Patient Instructions (Addendum)
You might need to use the Flovent when you are sick or having a harder time breathing. Remember to rinse your mouth after you use this.  Start the [place] on a daily basis. The Xyzal can be as needed.  Claritin (loratadine), Allegra (fexofenadine), Zyrtec (cetirizine); these are listed in order from weakest to strongest. Generic, and therefore cheaper, options are in the parentheses.   Flonase (fluticasone); nasal spray that is over the counter. 2 sprays each nostril, once daily. Aim towards the same side eye when you spray.  There are available OTC, and the generic versions, which may be cheaper, are in parentheses. Show this to a pharmacist if you have trouble finding any of these items.  Let us know if you need anything.

## 2018-09-09 ENCOUNTER — Other Ambulatory Visit: Payer: Self-pay

## 2018-09-09 ENCOUNTER — Encounter: Payer: Self-pay | Admitting: Internal Medicine

## 2018-09-09 ENCOUNTER — Ambulatory Visit (INDEPENDENT_AMBULATORY_CARE_PROVIDER_SITE_OTHER): Payer: BLUE CROSS/BLUE SHIELD | Admitting: Internal Medicine

## 2018-09-09 DIAGNOSIS — M545 Low back pain, unspecified: Secondary | ICD-10-CM

## 2018-09-09 MED ORDER — CYCLOBENZAPRINE HCL 10 MG PO TABS: 10.0000 mg | ORAL_TABLET | Freq: Two times a day (BID) | ORAL | 0 refills | Status: DC | PRN

## 2018-09-09 NOTE — Progress Notes (Signed)
Subjective:    Patient ID: Barbara Flores, female    DOB: 1960-09-14, 58 y.o.   MRN: [id number]  DOS:  09/09/2018 Type of visit - description: Video visit  Virtual Visit via Video Note  I connected with Barbara Flores on 09/09/18 at 10:20 AM EDT by a video enabled telemedicine application and verified that I am speaking with the correct person using two identifiers.   I discussed the limitations of evaluation and management by telemedicine and the availability of in person appointments. The patient expressed understanding and agreed to proceed.   The patient reports 1 day history of a low back pain, left-sided, described as a cramp.  Denies any radiation. Ibuprofen helped some, Tylenol is not helping. She also mentioned that this could be "my ovary" because the pain somewhat projects anteriorly.  I asked her several times where is the mos severe pain  and she said it is on her  back.   Review of Systems Denies fever chills.  No rash or injury No nausea, vomiting, constipation. Appetite is okay No vaginal bleeding or vaginal discharge Mild dysuria?  Past Medical History:  Diagnosis Date  . Allergy   . Asthma   . Diabetes mellitus 08/23/2010  . GERD (gastroesophageal reflux disease)   . History of exercise stress test 07-2010   normal except for elevated BP  . Hyperlipidemia   . Hypertension   . Menopause   . Rosacea     Past Surgical History:  Procedure Laterality Date  . Eardrum surgery    . HEMORRHOID SURGERY    . NASAL SINUS SURGERY    . OTHER SURGICAL HISTORY     female surgery after miscarriage, ? D&C or edometrial surgery    Social History   Socioeconomic History  . Marital status: Married    Spouse name: Not on file  . Number of children: 2  . Years of education: Not on file  . Highest education level: Not on file  Occupational History  . Occupation: Health visitor: [employer]  . Financial resource strain: Not on file  . Food insecurity:    Worry: Not on file    Inability: Not on file  . Transportation needs:    Medical: Not on file    Non-medical: Not on file  Tobacco Use  . Smoking status: Never Smoker  . Smokeless tobacco: Never Used  Substance and Sexual Activity  . Alcohol use: Yes    Alcohol/week: 0.0 standard drinks    Comment: red wine glass rarely   . Drug use: No  . Sexual activity: Not on file  Lifestyle  . Physical activity:    Days per week: Not on file    Minutes per session: Not on file  . Stress: Not on file  Relationships  . Social connections:    Talks on phone: Not on file    Gets together: Not on file    Attends religious service: Not on file    Active member of club or organization: Not on file    Attends meetings of clubs or organizations: Not on file    Relationship status: Not on file  . Intimate partner violence:    Fear of current or ex partner: Not on file    Emotionally abused: Not on file    Physically abused: Not on file    Forced sexual activity: Not on file  Other Topics Concern  . Not on file  Social History Narrative  Household --pt, husband    2 children-- son 53, daughter 51        Allergies as of 09/09/2018      Reactions   Crab [shellfish Allergy]    Hydrocodone Nausea Only   Penicillins    vomitting   Amoxicillin Rash      Medication List       Accurate as of September 09, 2018 10:23 AM. Always use your most recent med list.        albuterol 108 (90 Base) MCG/ACT inhaler Commonly known as:  Ventolin HFA Inhale 2 puffs into the lungs every 6 (six) hours as needed for wheezing.   aspirin 81 MG tablet Take 81 mg by mouth daily.   azelastine 0.1 % nasal spray Commonly known as:  ASTELIN Place 2 sprays into both nostrils at bedtime as needed for rhinitis. Use in each nostril as directed   beclomethasone 40 MCG/ACT inhaler Commonly known as:  Qvar Inhale 2 puffs into the lungs 2 (two) times daily.   Finacea 15 % cream Generic drug:  Azelaic Acid    fluticasone 110 MCG/ACT inhaler Commonly known as:  Flovent HFA Inhale 2 puffs into the lungs 2 (two) times daily.   fluticasone 50 MCG/ACT nasal spray Commonly known as:  FLONASE Place 2 sprays into both nostrils daily.   hydrochlorothiazide 25 MG tablet Commonly known as:  HYDRODIURIL Take 1 tablet (25 mg total) by mouth daily.   ibuprofen 600 MG tablet Commonly known as:  ADVIL,MOTRIN Take 1 tablet (600 mg total) by mouth every 8 (eight) hours as needed.   levocetirizine 5 MG tablet Commonly known as:  XYZAL Take 1 tablet (5 mg total) by mouth every evening.   losartan 100 MG tablet Commonly known as:  COZAAR Take 1 tablet (100 mg total) by mouth daily.   simvastatin 40 MG tablet Commonly known as:  ZOCOR Take 1 tablet (40 mg total) by mouth daily.           Objective:   Physical Exam There were no vitals taken for this visit. This is a video conference, she looked well and comfortable    Assessment      Assessment  Diabetes 2012 HTN Dyslipidemia Asthma Menopause at age 81 Rosacea GI: --RUQ pain: Ultrasound and HIDA scan negative (02-2015, 03-2015) -- 07/2016--  H Pylori  (+) , partially treated biaxin/flagel, poor tolerance  --constipation  Stress test 2012 neg , elevated BP +FH CAD brother MI 47 y/o  PLAN Back pain: Main symptom today is back pain, I made the patient aware of the limitation of video visit. We agreed on treat as a MSK issue with Flexeril twice a day and ibuprofen.  GI precautions discussed.  See my chart message sent with more detail instructions. I also asked her to come to the office in person tomorrow if she noticed that the pain is more abdominal or to go to the urgent care during the weekend if she gets worse.   I discussed the assessment and treatment plan with the patient. The patient was provided an opportunity to ask questions and all were answered. The patient agreed with the plan and demonstrated an understanding of the  instructions.   The patient was advised to call back or seek an in-person evaluation if the symptoms worsen or if the condition fails to improve as anticipated.  I provided  > 15 minutes of non-face-to-face time during this encounter.      DM: Diet controlled, has  improved her eating habits, she remains active.  Feet exam normal, check a A1c. HTN: On losartan, has been skipping HCTZ frequently because BPs is very good.  Will leave HCTZ in her list in case she needs it again; advised not to take unless BPs are elevated High cholesterol: On simvastatin, not fasting today, checking labs Asthma: Not using any inhalers, no symptoms. Constipation: Not needing Amitiza, doing well. RTC 6 months

## 2018-09-09 NOTE — Telephone Encounter (Signed)
Please advise 

## 2018-09-09 NOTE — Telephone Encounter (Signed)
Copied from [place] [id number]. Topic: General - Other >> Sep 09, 2018 11:00 AM Bea Graff, NT wrote: Reason for CRM: Pt needs a note to state she will be out of work today and tomorrow. Would like this letter sent to her mychart.

## 2018-09-10 DIAGNOSIS — R1032 Left lower quadrant pain: Secondary | ICD-10-CM | POA: Diagnosis not present

## 2018-09-10 DIAGNOSIS — N950 Postmenopausal bleeding: Secondary | ICD-10-CM | POA: Diagnosis not present

## 2018-09-10 DIAGNOSIS — M549 Dorsalgia, unspecified: Secondary | ICD-10-CM | POA: Diagnosis not present

## 2018-09-10 NOTE — Assessment & Plan Note (Signed)
Back pain: Main symptom today is back pain, I made the patient aware of the limitation of video visit. We agreed on treat as a MSK issue with Flexeril twice a day and ibuprofen.  GI precautions discussed.  See my chart message sent with more detail instructions. I also asked her to come to the office in person tomorrow if she noticed that the pain is more abdominal or to go to the urgent care during the weekend if she gets worse.

## 2018-09-22 DIAGNOSIS — R1032 Left lower quadrant pain: Secondary | ICD-10-CM | POA: Diagnosis not present

## 2018-09-22 DIAGNOSIS — N950 Postmenopausal bleeding: Secondary | ICD-10-CM | POA: Diagnosis not present

## 2019-01-11 ENCOUNTER — Ambulatory Visit: Payer: BLUE CROSS/BLUE SHIELD | Admitting: Internal Medicine

## 2019-01-26 ENCOUNTER — Other Ambulatory Visit: Payer: Self-pay | Admitting: Internal Medicine

## 2019-04-09 ENCOUNTER — Other Ambulatory Visit: Payer: Self-pay | Admitting: Internal Medicine

## 2019-04-12 ENCOUNTER — Other Ambulatory Visit: Payer: Self-pay | Admitting: Internal Medicine

## 2019-05-18 ENCOUNTER — Other Ambulatory Visit: Payer: Self-pay | Admitting: Internal Medicine

## 2019-05-18 MED ORDER — HYDROCHLOROTHIAZIDE 25 MG PO TABS: 25.0000 mg | ORAL_TABLET | Freq: Every day | ORAL | 0 refills | Status: DC

## 2019-05-18 MED ORDER — LOSARTAN POTASSIUM 100 MG PO TABS: 100.0000 mg | ORAL_TABLET | Freq: Every day | ORAL | 0 refills | Status: DC

## 2019-06-08 ENCOUNTER — Ambulatory Visit (INDEPENDENT_AMBULATORY_CARE_PROVIDER_SITE_OTHER): Payer: BC Managed Care – PPO | Admitting: Internal Medicine

## 2019-06-08 ENCOUNTER — Other Ambulatory Visit: Payer: Self-pay

## 2019-06-08 DIAGNOSIS — Z09 Encounter for follow-up examination after completed treatment for conditions other than malignant neoplasm: Secondary | ICD-10-CM

## 2019-06-08 MED ORDER — HYDROCHLOROTHIAZIDE 25 MG PO TABS: 25.0000 mg | ORAL_TABLET | Freq: Every day | ORAL | 3 refills | Status: DC

## 2019-06-08 MED ORDER — LOSARTAN POTASSIUM 100 MG PO TABS: 100.0000 mg | ORAL_TABLET | Freq: Every day | ORAL | 3 refills | Status: DC

## 2019-06-08 MED ORDER — SIMVASTATIN 40 MG PO TABS: 40.0000 mg | ORAL_TABLET | Freq: Every day | ORAL | 3 refills | Status: DC

## 2019-06-08 NOTE — Progress Notes (Signed)
Subjective:    Patient ID: Barbara Flores, female    DOB: 08/11/1960, 58 y.o.   MRN: [id number]  DOS:  06/08/2019 Type of visit - description: Attempted  to make this a video visit, due to technical difficulties from the patient side it was not possible  thus we proceeded with a Virtual Visit via Telephone    I connected with above mentioned patient  by telephone and verified that I am speaking with the correct person using two identifiers.  THIS ENCOUNTER IS A VIRTUAL VISIT DUE TO COVID-19 - PATIENT WAS NOT SEEN IN THE OFFICE. PATIENT HAS CONSENTED TO VIRTUAL VISIT / TELEMEDICINE VISIT   Location of patient: home  Location of provider: office  I discussed the limitations, risks, security and privacy concerns of performing an evaluation and management service by telephone and the availability of in person appointments. I also discussed with the patient that there may be a patient responsible charge related to this service. The patient expressed understanding and agreed to proceed.   History of Present Illness:   Routine visit HTN: Need refills, BP yesterday 140/84 Asthma: Currently well controlled, not needing inhalers High cholesterol: Good med compliance need refills Back pain: See last visit, symptoms quickly resolved.    Review of Systems Denies any other problems or concerns  Past Medical History:  Diagnosis Date  . Allergy   . Asthma   . Diabetes mellitus 08/23/2010  . GERD (gastroesophageal reflux disease)   . History of exercise stress test 07-2010   normal except for elevated BP  . Hyperlipidemia   . Hypertension   . Menopause   . Rosacea     Past Surgical History:  Procedure Laterality Date  . Eardrum surgery    . HEMORRHOID SURGERY    . NASAL SINUS SURGERY    . OTHER SURGICAL HISTORY     female surgery after miscarriage, ? D&C or edometrial surgery    Social History   Socioeconomic History  . Marital status: Married    Spouse name: Not on file  .  Number of children: 2  . Years of education: Not on file  . Highest education level: Not on file  Occupational History  . 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 Smoking status: Never Smoker  . Smokeless tobacco: Never Used  Substance and Sexual Activity  . Alcohol use: Yes    Alcohol/week: 0.0 standard drinks    Comment: red wine glass rarely   . Drug use: No  . Sexual activity: Not on file  Other Topics Concern  . Not on file  Social History Narrative   Household --pt, husband    2 children-- son 58, daughter 93     Social Determinants of Radio broadcast assistant Strain:   . Difficulty of Paying Living Expenses: Not on file  Food Insecurity:   . Worried About Charity fundraiser in the Last Year: Not on file  . Ran Out of Food in the Last Year: Not on file  Transportation Needs:   . Lack of Transportation (Medical): Not on file  . Lack of Transportation (Non-Medical): Not on file  Physical Activity:   . Days of Exercise per Week: Not on file  . Minutes of Exercise per Session: Not on file  Stress:   . Feeling of Stress : Not on file  Social Connections:   . Frequency of Communication with Friends and Family: Not on file  . Frequency  of Social Gatherings with Friends and Family: Not on file  . Attends Religious Services: Not on file  . Active Member of Clubs or Organizations: Not on file  . Attends Archivist Meetings: Not on file  . Marital Status: Not on file  Intimate Partner Violence:   . Fear of Current or Ex-Partner: Not on file  . Emotionally Abused: Not on file  . Physically Abused: Not on file  . Sexually Abused: Not on file      Allergies as of 06/08/2019      Reactions   Crab [shellfish Allergy]    Hydrocodone Nausea Only   Penicillins    vomitting   Amoxicillin Rash      Medication List       Accurate as of June 08, 2019  1:44 PM. If you have any questions, ask your nurse or doctor.        albuterol  108 (90 Base) MCG/ACT inhaler Commonly known as: Ventolin HFA Inhale 2 puffs into the lungs every 6 (six) hours as needed for wheezing.   aspirin 81 MG tablet Take 81 mg by mouth daily.   azelastine 0.1 % nasal spray Commonly known as: ASTELIN Place 2 sprays into both nostrils at bedtime as needed for rhinitis. Use in each nostril as directed   beclomethasone 40 MCG/ACT inhaler Commonly known as: Qvar Inhale 2 puffs into the lungs 2 (two) times daily.   cyclobenzaprine 10 MG tablet Commonly known as: FLEXERIL Take 1 tablet (10 mg total) by mouth 2 (two) times daily as needed for muscle spasms.   Finacea 15 % cream Generic drug: Azelaic Acid   fluticasone 110 MCG/ACT inhaler Commonly known as: Flovent HFA Inhale 2 puffs into the lungs 2 (two) times daily.   fluticasone 50 MCG/ACT nasal spray Commonly known as: FLONASE Place 2 sprays into both nostrils daily.   hydrochlorothiazide 25 MG tablet Commonly known as: HYDRODIURIL Take 1 tablet (25 mg total) by mouth daily.   ibuprofen 600 MG tablet Commonly known as: ADVIL Take 1 tablet (600 mg total) by mouth every 8 (eight) hours as needed.   levocetirizine 5 MG tablet Commonly known as: XYZAL Take 1 tablet (5 mg total) by mouth every evening.   losartan 100 MG tablet Commonly known as: COZAAR Take 1 tablet (100 mg total) by mouth daily.   simvastatin 40 MG tablet Commonly known as: ZOCOR Take 1 tablet (40 mg total) by mouth at bedtime.           Objective:   Physical Exam There were no vitals taken for this visit. This is a virtual telephone visit.  She is alert oriented x3, in no apparent distress.  Speaking in complete sentences.     Assessment    Assessment  Diabetes 2012 HTN Dyslipidemia Asthma Menopause at age 65 Rosacea GI: --RUQ pain: Ultrasound and HIDA scan negative (02-2015, 03-2015) -- 07/2016--  H Pylori  (+) , partially treated biaxin/flagel, poor tolerance  --constipation  Stress test  2012 neg , elevated BP +FH CAD brother MI 14 y/o  PLAN DM: Last A1c 6.7, diet controlled, needs a recheck when she comes back HTN: Refill medications. Dyslipidemia: Refill medications Asthma well-controlled, not needing any inhalers at this point. RTC in January for CPX.  Will do the needed blood work at that time.    I discussed the assessment and treatment plan with the patient. The patient was provided an opportunity to ask questions and all were answered. The patient  agreed with the plan and demonstrated an understanding of the instructions.   The patient was advised to call back or seek an in-person evaluation if the symptoms worsen or if the condition fails to improve as anticipated.  I provided 12 minutes of non-face-to-face time during this encounter.  Kathlene November, MD

## 2019-06-09 NOTE — Assessment & Plan Note (Signed)
DM: Last A1c 6.7, diet controlled, needs a recheck when she comes back HTN: Refill medications. Dyslipidemia: Refill medications Asthma well-controlled, not needing any inhalers at this point. RTC in January for CPX.  Will do the needed blood work at that time.

## 2019-07-07 ENCOUNTER — Other Ambulatory Visit: Payer: Self-pay

## 2019-07-08 ENCOUNTER — Encounter: Payer: Self-pay | Admitting: Internal Medicine

## 2019-07-08 ENCOUNTER — Other Ambulatory Visit: Payer: Self-pay

## 2019-07-08 ENCOUNTER — Ambulatory Visit (INDEPENDENT_AMBULATORY_CARE_PROVIDER_SITE_OTHER): Payer: BC Managed Care – PPO | Admitting: Internal Medicine

## 2019-07-08 VITALS — BP 163/73 | HR 97 | Temp 96.7°F | Resp 16 | Ht 59.0 in | Wt 158.0 lb

## 2019-07-08 DIAGNOSIS — I10 Essential (primary) hypertension: Secondary | ICD-10-CM | POA: Diagnosis not present

## 2019-07-08 DIAGNOSIS — E119 Type 2 diabetes mellitus without complications: Secondary | ICD-10-CM

## 2019-07-08 DIAGNOSIS — Z0000 Encounter for general adult medical examination without abnormal findings: Secondary | ICD-10-CM | POA: Diagnosis not present

## 2019-07-08 LAB — COMPREHENSIVE METABOLIC PANEL
ALT: 21 U/L (ref 0–35)
AST: 18 U/L (ref 0–37)
Albumin: 4.8 g/dL (ref 3.5–5.2)
Alkaline Phosphatase: 99 U/L (ref 39–117)
BUN: 13 mg/dL (ref 6–23)
CO2: 23 mEq/L (ref 19–32)
Calcium: 9.7 mg/dL (ref 8.4–10.5)
Chloride: 103 mEq/L (ref 96–112)
Creatinine, Ser: 0.71 mg/dL (ref 0.40–1.20)
GFR: 102.89 mL/min (ref >60.00)
Glucose, Bld: 149 mg/dL — ABNORMAL HIGH (ref 70–99)
Potassium: 3.9 mEq/L (ref 3.5–5.1)
Sodium: 137 mEq/L (ref 135–145)
Total Bilirubin: 0.4 mg/dL (ref 0.2–1.2)
Total Protein: 7.8 g/dL (ref 6.0–8.3)

## 2019-07-08 LAB — CBC WITH DIFFERENTIAL/PLATELET
Basophils Absolute: 0 10*3/uL (ref 0.0–0.1)
Basophils Relative: 0.6 % (ref 0.0–3.0)
Eosinophils Absolute: 0.1 10*3/uL (ref 0.0–0.7)
Eosinophils Relative: 2.1 % (ref 0.0–5.0)
HCT: 42.9 % (ref 36.0–46.0)
Hemoglobin: 14.5 g/dL (ref 12.0–15.0)
Lymphocytes Relative: 35.7 % (ref 12.0–46.0)
Lymphs Abs: 2.5 10*3/uL (ref 0.7–4.0)
MCHC: 33.8 g/dL (ref 30.0–36.0)
MCV: 86.6 fl (ref 78.0–100.0)
Monocytes Absolute: 0.5 10*3/uL (ref 0.1–1.0)
Monocytes Relative: 6.7 % (ref 3.0–12.0)
Neutro Abs: 3.8 10*3/uL (ref 1.4–7.7)
Neutrophils Relative %: 54.9 % (ref 43.0–77.0)
Platelets: 271 10*3/uL (ref 150.0–400.0)
RBC: 4.96 Mil/uL (ref 3.87–5.11)
RDW: 14.3 % (ref 11.5–15.5)
WBC: 6.9 10*3/uL (ref 4.0–10.5)

## 2019-07-08 LAB — LIPID PANEL
Cholesterol: 185 mg/dL (ref 0–200)
HDL: 62.6 mg/dL (ref >39.00)
LDL Cholesterol: 98 mg/dL (ref 0–99)
NonHDL: 122.78
Total CHOL/HDL Ratio: 3
Triglycerides: 125 mg/dL (ref 0.0–149.0)
VLDL: 25 mg/dL (ref 0.0–40.0)

## 2019-07-08 LAB — HEMOGLOBIN A1C: Hgb A1c MFr Bld: 7.1 % — ABNORMAL HIGH (ref 4.6–6.5)

## 2019-07-08 NOTE — Progress Notes (Signed)
Pre visit review using our clinic review tool, if applicable. No additional management support is needed unless otherwise documented below in the visit note. 

## 2019-07-08 NOTE — Progress Notes (Signed)
Subjective:    Patient ID: Barbara Flores, female    DOB: 09/20/1960, 59 y.o.   MRN: [id number]  DOS:  07/08/2019 Type of visit - description: cpx In general feeling well. I notice weight gain and elevated BP, she admits is not eating healthy lately and has not taken her medications today. When she check homes BPs are also slightly elevated.  Wt Readings from Last 3 Encounters:  07/08/19 158 lb (71.7 kg)  08/26/18 150 lb (68 kg)  07/12/18 151 lb 4 oz (68.6 kg)     Review of Systems  Other than above, a 14 point review of systems is negative     Past Medical History:  Diagnosis Date  . Allergy   . Asthma   . Diabetes mellitus 08/23/2010  . GERD (gastroesophageal reflux disease)   . History of exercise stress test 07-2010   normal except for elevated BP  . Hyperlipidemia   . Hypertension   . Menopause   . Rosacea     Past Surgical History:  Procedure Laterality Date  . Eardrum surgery    . HEMORRHOID SURGERY    . NASAL SINUS SURGERY    . OTHER SURGICAL HISTORY     female surgery after miscarriage, ? D&C or edometrial surgery   Family History  Problem Relation Age of Onset  . Diabetes Mother   . Hyperlipidemia Mother   . Hypertension Mother   . Stroke Mother 18  . Kidney failure Mother   . Stomach cancer Father        deceased  . Breast cancer Sister 97  . Heart attack Brother 63  . Colon cancer Neg Hx   . Esophageal cancer Neg Hx   . Rectal cancer Neg Hx          Objective:   Physical Exam BP (!) 163/73 (BP Location: Left Arm, Patient Position: Sitting, Cuff Size: Small) Comment: has not taken meds yet  Pulse 97   Temp (!) 96.7 F (35.9 C) (Temporal)   Resp 16   Ht 4\' 11"  (1.499 m)   Wt 158 lb (71.7 kg)   SpO2 100%   BMI 31.91 kg/m  General: Well developed, NAD, BMI noted Neck: No  thyromegaly  HEENT:  Normocephalic . Face symmetric, atraumatic Lungs:  CTA B Normal respiratory effort, no intercostal retractions, no accessory muscle  use. Heart: RRR,  no murmur.  No pretibial edema bilaterally  Abdomen:  Not distended, soft, non-tender. No rebound or rigidity.   Skin: Exposed areas without rash. Not pale. Not jaundice Neurologic:  alert & oriented X3.  Speech normal, gait appropriate for age and unassisted Strength symmetric and appropriate for age.  Psych: Cognition and judgment appear intact.  Cooperative with normal attention span and concentration.  Behavior appropriate. No anxious or depressed appearing.     Assessment      Assessment  Diabetes 2012 HTN Dyslipidemia Asthma Menopause at age 79 Rosacea GI: --RUQ pain: Ultrasound and HIDA scan negative (02-2015, 03-2015) -- 07/2016--  H Pylori  (+) , partially treated biaxin/flagel, poor tolerance  --constipation  Stress test 2012 neg , elevated BP +FH CAD brother MI 36 y/o  PLAN Here for CPX DM: Diet controlled, last A1c 6.7, she is not doing well with diet and has gained weight.  Recheck A1c, consider medications if A1c is very high otherwise we agreed on work on lifestyle first. HTN: BP today slightly elevated, I recheck it manually: 150/90.  Good compliance with HCTZ, losartan, did  not have her medications today. Plan: Continue present meds, check ambulatory BPs, watch diet. High cholesterol: On simvastatin, checking labs Asthma: Not needing inhalers lately RTC 3 months.    This visit occurred during the SARS-CoV-2 public health emergency.  Safety protocols were in place, including screening questions prior to the visit, additional usage of staff PPE, and extensive cleaning of exam room while observing appropriate contact time as indicated for disinfecting solutions.

## 2019-07-08 NOTE — Patient Instructions (Addendum)
Per our records you are due for an eye exam. Please contact your eye doctor to schedule an appointment. Please have them send copies of your office visit notes to [place]. Our fax number is [phone].  GO TO THE LAB : Get the blood work     GO TO THE FRONT DESK Schedule your next appointment   for a checkup in 3 months  Check your blood pressures weekly BP GOAL is between 110/65 and  135/85. If it is consistently higher or lower, let me know

## 2019-07-09 NOTE — Assessment & Plan Note (Signed)
-  Td 2012; pnm shot 2015, prevnar 2016; had a flu shot  -CCS:  Colonoscopy 07-2015 (-), next 10 years (iFOB in 5 years?) -Female care per gynecologist    Saw   Dr Lisbeth Renshaw 2020 , had a MMG 03/2018 , planning another MMG this summer at work -Diet: Discussed, not doing well since the quarantine started.  She remains active at work. -labs: CMP, FLP, CBC, A1c -Bone health: Menopause at age 59,  [place] 5 years after menopause.

## 2019-07-09 NOTE — Assessment & Plan Note (Signed)
Here for CPX DM: Diet controlled, last A1c 6.7, she is not doing well with diet and has gained weight.  Recheck A1c, consider medications if A1c is very high otherwise we agreed on work on lifestyle first. HTN: BP today slightly elevated, I recheck it manually: 150/90.  Good compliance with HCTZ, losartan, did not have her medications today. Plan: Continue present meds, check ambulatory BPs, watch diet. High cholesterol: On simvastatin, checking labs Asthma: Not needing inhalers lately RTC 3 months.

## 2019-10-07 ENCOUNTER — Ambulatory Visit: Payer: BC Managed Care – PPO | Admitting: Internal Medicine

## 2019-10-10 ENCOUNTER — Other Ambulatory Visit: Payer: Self-pay | Admitting: Internal Medicine

## 2019-10-11 ENCOUNTER — Other Ambulatory Visit: Payer: Self-pay | Admitting: Internal Medicine

## 2019-10-21 ENCOUNTER — Encounter: Payer: Self-pay | Admitting: Internal Medicine

## 2019-10-21 ENCOUNTER — Other Ambulatory Visit: Payer: Self-pay

## 2019-10-21 ENCOUNTER — Ambulatory Visit (INDEPENDENT_AMBULATORY_CARE_PROVIDER_SITE_OTHER): Payer: BC Managed Care – PPO | Admitting: Internal Medicine

## 2019-10-21 VITALS — BP 165/87 | HR 87 | Temp 97.0°F | Resp 18 | Ht 59.0 in | Wt 147.4 lb

## 2019-10-21 DIAGNOSIS — E119 Type 2 diabetes mellitus without complications: Secondary | ICD-10-CM

## 2019-10-21 DIAGNOSIS — I10 Essential (primary) hypertension: Secondary | ICD-10-CM | POA: Diagnosis not present

## 2019-10-21 LAB — HEMOGLOBIN A1C: Hgb A1c MFr Bld: 7 % — ABNORMAL HIGH (ref 4.6–6.5)

## 2019-10-21 LAB — BASIC METABOLIC PANEL
BUN: 13 mg/dL (ref 6–23)
CO2: 25 mEq/L (ref 19–32)
Calcium: 9.7 mg/dL (ref 8.4–10.5)
Chloride: 105 mEq/L (ref 96–112)
Creatinine, Ser: 0.72 mg/dL (ref 0.40–1.20)
GFR: 101.14 mL/min (ref >60.00)
Glucose, Bld: 121 mg/dL — ABNORMAL HIGH (ref 70–99)
Potassium: 4 mEq/L (ref 3.5–5.1)
Sodium: 138 mEq/L (ref 135–145)

## 2019-10-21 NOTE — Patient Instructions (Addendum)
Per our records you are due for an eye exam. Please contact your eye doctor to schedule an appointment. Please have them send copies of your office visit notes to [place]. Our fax number is [phone].   Continue checking your blood pressure  BP GOAL is between 110/65 and  135/85. If it is consistently higher or lower, let me know    GO TO THE LAB : Get the blood work     [place], PLEASE SCHEDULE YOUR APPOINTMENTS Come back for  A check up in 4 months

## 2019-10-21 NOTE — Assessment & Plan Note (Signed)
DM: Diet controlled, did well for a while, then he'll let her guard down.  We'll check a A1c.  Consider Metformin.  See next HTN: Controlled?  BP today 165/87, at home typically 130/90.  On losartan and HCT. We talk about adding medication, she is quite reluctant to take "more medicine" thus my plan is to add Metformin if appropriate and in few months adjust  BP medication if appropriate. Patient education: I again advised patient that portion control, exercise, low-salt diet will help both DM and HTN. Preventive care: Had Covid shots RTC 4 months

## 2019-10-21 NOTE — Progress Notes (Signed)
Subjective:    Patient ID: Barbara Flores, female    DOB: 1961-04-10, 59 y.o.   MRN: [id number]  DOS:  10/21/2019 Type of visit - description: Follow-up Today we talk about diabetes and hypertension. She did better with diet for a while, + portion control, increase exercise, lost some weight but then she let her guard down. No concerns otherwise    Review of Systems See above   Past Medical History:  Diagnosis Date  . Allergy   . Asthma   . Diabetes mellitus 08/23/2010  . GERD (gastroesophageal reflux disease)   . History of exercise stress test 07-2010   normal except for elevated BP  . Hyperlipidemia   . Hypertension   . Menopause   . Rosacea     Past Surgical History:  Procedure Laterality Date  . Eardrum surgery    . HEMORRHOID SURGERY    . NASAL SINUS SURGERY    . OTHER SURGICAL HISTORY     female surgery after miscarriage, ? D&C or edometrial surgery    Allergies as of 10/21/2019      Reactions   Crab [shellfish Allergy]    Hydrocodone Nausea Only   Penicillins    vomitting   Amoxicillin Rash      Medication List       Accurate as of Oct 21, 2019  8:11 AM. If you have any questions, ask your nurse or doctor.        albuterol 108 (90 Base) MCG/ACT inhaler Commonly known as: Ventolin HFA Inhale 2 puffs into the lungs every 6 (six) hours as needed for wheezing.   aspirin 81 MG tablet Take 81 mg by mouth daily.   azelastine 0.1 % nasal spray Commonly known as: ASTELIN Place 2 sprays into both nostrils at bedtime as needed for rhinitis. Use in each nostril as directed   beclomethasone 40 MCG/ACT inhaler Commonly known as: Qvar Inhale 2 puffs into the lungs 2 (two) times daily.   Finacea 15 % cream Generic drug: Azelaic Acid   fluticasone 110 MCG/ACT inhaler Commonly known as: Flovent HFA Inhale 2 puffs into the lungs 2 (two) times daily.   fluticasone 50 MCG/ACT nasal spray Commonly known as: FLONASE Place 2 sprays into both nostrils daily.     hydrochlorothiazide 25 MG tablet Commonly known as: HYDRODIURIL Take 1 tablet (25 mg total) by mouth daily.   ibuprofen 600 MG tablet Commonly known as: ADVIL Take 1 tablet (600 mg total) by mouth every 8 (eight) hours as needed.   levocetirizine 5 MG tablet Commonly known as: XYZAL Take 1 tablet (5 mg total) by mouth every evening.   losartan 100 MG tablet Commonly known as: COZAAR Take 1 tablet (100 mg total) by mouth daily.   simvastatin 40 MG tablet Commonly known as: ZOCOR Take 1 tablet (40 mg total) by mouth at bedtime.          Objective:   Physical Exam BP (!) 165/87 (BP Location: Left Arm, Patient Position: Sitting, Cuff Size: Small)   Pulse 87   Temp (!) 97 F (36.1 C) (Temporal)   Resp 18   Ht 4\' 11"  (1.499 m)   Wt 147 lb 6 oz (66.8 kg)   SpO2 100%   BMI 29.77 kg/m  General:   Well developed, NAD, BMI noted. HEENT:  Normocephalic . Face symmetric, atraumatic Lungs:  CTA B Normal respiratory effort, no intercostal retractions, no accessory muscle use. Heart: RRR,  no murmur.  Lower extremities: no pretibial  edema bilaterally  Skin: Not pale. Not jaundice Neurologic:  alert & oriented X3.  Speech normal, gait appropriate for age and unassisted Psych--  Cognition and judgment appear intact.  Cooperative with normal attention span and concentration.  Behavior appropriate. No anxious or depressed appearing.      Assessment      Assessment  Diabetes 2012 HTN Dyslipidemia Asthma Menopause at age 70 Rosacea GI: --RUQ pain: Ultrasound and HIDA scan negative (02-2015, 03-2015) -- 07/2016--  H Pylori  (+) , partially treated biaxin/flagel, poor tolerance  --constipation  Stress test 2012 neg , elevated BP +FH CAD brother MI 28 y/o  PLAN DM: Diet controlled, did well for a while, then he'll let her guard down.  We'll check a A1c.  Consider Metformin.  See next HTN: Controlled?  BP today 165/87, at home typically 130/90.  On losartan and  HCT. We talk about adding medication, she is quite reluctant to take "more medicine" thus my plan is to add Metformin if appropriate and in few months adjust  BP medication if appropriate. Patient education: I again advised patient that portion control, exercise, low-salt diet will help both DM and HTN. Preventive care: Had Covid shots RTC 4 months    This visit occurred during the SARS-CoV-2 public health emergency.  Safety protocols were in place, including screening questions prior to the visit, additional usage of staff PPE, and extensive cleaning of exam room while observing appropriate contact time as indicated for disinfecting solutions.

## 2019-10-21 NOTE — Progress Notes (Signed)
Pre visit review using our clinic review tool, if applicable. No additional management support is needed unless otherwise documented below in the visit note. 

## 2019-10-24 ENCOUNTER — Telehealth: Payer: Self-pay

## 2019-10-24 MED ORDER — METFORMIN HCL 850 MG PO TABS: ORAL_TABLET | ORAL | 4 refills | Status: DC

## 2019-10-24 NOTE — Addendum Note (Signed)
Addended byDamita Dunnings D on: 10/24/2019 12:58 PM   Modules accepted: Orders

## 2019-10-24 NOTE — Telephone Encounter (Signed)
Please advise 

## 2019-10-24 NOTE — Telephone Encounter (Signed)
Patient called in to let Dr. Larose Kells know that she has a bad Side effects from the medication prescribed Metformin. The patient would like if DR. Paz or the nurse could  give her a call back at [phone]

## 2019-10-24 NOTE — Telephone Encounter (Signed)
I was not aware that she tried Metformin before. I strongly recommend her to give it another try: We can go even slower on the Metformin 850 mg: Half tablet the first 10 days Then half tablet twice a day for 10 days Then 1 tablet twice a day

## 2019-10-24 NOTE — Telephone Encounter (Signed)
LMOM informing Pt of PCP recommendations. Instructed to call if questions.

## 2019-11-03 ENCOUNTER — Telehealth: Payer: Self-pay | Admitting: Internal Medicine

## 2019-11-03 MED ORDER — SITAGLIPTIN PHOSPHATE 100 MG PO TABS: 100.0000 mg | ORAL_TABLET | Freq: Every day | ORAL | 3 refills | Status: DC

## 2019-11-03 NOTE — Telephone Encounter (Signed)
LMOM informing Pt to d/c metformin and start Januvia 100mg . Rx sent to [facility].

## 2019-11-03 NOTE — Telephone Encounter (Signed)
Caller: Zeta Call back number: [phone]  Patient states she's been taking Metformin now about a week and half. Patient states she even tries to take half of the pill with no change on side of effect. Patient is experiencing stomach pain, diarrhea and nausea.  Please advise.

## 2019-11-03 NOTE — Telephone Encounter (Signed)
Okay. Advise patient to stop Metformin. Start Januvia 100 mg 1 tablet daily #30 and 3 refills. I  don't anticipate major side effects.

## 2019-11-03 NOTE — Telephone Encounter (Signed)
Please advise 

## 2019-11-16 ENCOUNTER — Other Ambulatory Visit: Payer: Self-pay | Admitting: Internal Medicine

## 2019-11-24 ENCOUNTER — Other Ambulatory Visit: Payer: Self-pay | Admitting: Obstetrics & Gynecology

## 2019-11-24 ENCOUNTER — Other Ambulatory Visit: Payer: Self-pay

## 2019-11-24 ENCOUNTER — Ambulatory Visit
Admission: RE | Admit: 2019-11-24 | Discharge: 2019-11-24 | Disposition: A | Discharge status: Home / Self Care | Payer: BC Managed Care – PPO | Source: Ambulatory Visit | Attending: Obstetrics & Gynecology | Admitting: Obstetrics & Gynecology

## 2019-11-24 DIAGNOSIS — Z1231 Encounter for screening mammogram for malignant neoplasm of breast: Secondary | ICD-10-CM

## 2019-12-22 ENCOUNTER — Encounter: Payer: Self-pay | Admitting: Internal Medicine

## 2020-01-30 DIAGNOSIS — M79604 Pain in right leg: Secondary | ICD-10-CM | POA: Diagnosis not present

## 2020-02-12 ENCOUNTER — Other Ambulatory Visit: Payer: Self-pay | Admitting: Internal Medicine

## 2020-02-13 ENCOUNTER — Other Ambulatory Visit: Payer: Self-pay | Admitting: Internal Medicine

## 2020-03-02 ENCOUNTER — Encounter: Payer: Self-pay | Admitting: Internal Medicine

## 2020-03-02 ENCOUNTER — Other Ambulatory Visit: Payer: Self-pay

## 2020-03-02 ENCOUNTER — Ambulatory Visit (INDEPENDENT_AMBULATORY_CARE_PROVIDER_SITE_OTHER): Payer: BC Managed Care – PPO | Admitting: Internal Medicine

## 2020-03-02 VITALS — BP 143/88 | HR 76 | Temp 97.7°F | Resp 16 | Ht 59.0 in | Wt 153.4 lb

## 2020-03-02 DIAGNOSIS — E119 Type 2 diabetes mellitus without complications: Secondary | ICD-10-CM

## 2020-03-02 DIAGNOSIS — Z23 Encounter for immunization: Secondary | ICD-10-CM | POA: Diagnosis not present

## 2020-03-02 DIAGNOSIS — I10 Essential (primary) hypertension: Secondary | ICD-10-CM

## 2020-03-02 NOTE — Patient Instructions (Addendum)
Per our records you are due for an eye exam. Please contact your eye doctor to schedule an appointment. Please have them send copies of your office visit notes to [place]. Our fax number is [phone].   Continue checking your blood pressure at least weekly.   BP GOAL is between 110/65 and  135/85. If it is consistently higher or lower, let me know     GO TO THE LAB : Get the blood work     [place], [place] back for   a physical exam by 06-2020

## 2020-03-02 NOTE — Progress Notes (Signed)
Subjective:    Patient ID: Barbara Flores, female    DOB: 08/04/1960, 59 y.o.   MRN: [id number]  DOS:  03/02/2020 Type of visit - description: Follow-up Today we talk about diabetes and hypertension. She is doing well and has no complaints. Has developed leg pain but was seen elsewhere    Review of Systems See above   Past Medical History:  Diagnosis Date  . Allergy   . Asthma   . Diabetes mellitus 08/23/2010  . GERD (gastroesophageal reflux disease)   . History of exercise stress test 07-2010   normal except for elevated BP  . Hyperlipidemia   . Hypertension   . Menopause   . Rosacea     Past Surgical History:  Procedure Laterality Date  . Eardrum surgery    . HEMORRHOID SURGERY    . NASAL SINUS SURGERY    . OTHER SURGICAL HISTORY     female surgery after miscarriage, ? D&C or edometrial surgery    Allergies as of 03/02/2020      Reactions   Crab [shellfish Allergy]    Hydrocodone Nausea Only   Penicillins    vomitting   Amoxicillin Rash      Medication List       Accurate as of March 02, 2020  8:25 AM. If you have any questions, ask your nurse or doctor.        albuterol 108 (90 Base) MCG/ACT inhaler Commonly known as: Ventolin HFA Inhale 2 puffs into the lungs every 6 (six) hours as needed for wheezing.   aspirin 81 MG tablet Take 81 mg by mouth daily.   azelastine 0.1 % nasal spray Commonly known as: ASTELIN Place 2 sprays into both nostrils at bedtime as needed for rhinitis. Use in each nostril as directed   Finacea 15 % cream Generic drug: Azelaic Acid   fluticasone 110 MCG/ACT inhaler Commonly known as: Flovent HFA Inhale 2 puffs into the lungs 2 (two) times daily.   fluticasone 50 MCG/ACT nasal spray Commonly known as: FLONASE Place 2 sprays into both nostrils daily.   ibuprofen 600 MG tablet Commonly known as: ADVIL Take 1 tablet (600 mg total) by mouth every 8 (eight) hours as needed.   levocetirizine 5 MG tablet Commonly known  as: XYZAL Take 1 tablet (5 mg total) by mouth every evening.   losartan-hydrochlorothiazide 100-25 MG tablet Commonly known as: HYZAAR Take 1 tablet by mouth daily.   simvastatin 40 MG tablet Commonly known as: ZOCOR Take 1 tablet (40 mg total) by mouth at bedtime.   sitaGLIPtin 100 MG tablet Commonly known as: Januvia Take 1 tablet (100 mg total) by mouth daily.          Objective:   Physical Exam BP (!) 143/88 (BP Location: Left Arm, Patient Position: Sitting, Cuff Size: Small)   Pulse 76   Temp 97.7 F (36.5 C) (Oral)   Resp 16   Ht 4\' 11"  (1.499 m)   Wt 153 lb 6 oz (69.6 kg)   SpO2 100%   BMI 30.98 kg/m  General:   Well developed, NAD, BMI noted. HEENT:  Normocephalic . Face symmetric, atraumatic Lungs:  CTA B Normal respiratory effort, no intercostal retractions, no accessory muscle use. Heart: RRR,  no murmur.  Lower extremities: no pretibial edema bilaterally  Skin: Not pale. Not jaundice Neurologic:  alert & oriented X3.  Speech normal, gait appropriate for age and unassisted Psych--  Cognition and judgment appear intact.  Cooperative with normal attention  span and concentration.  Behavior appropriate. No anxious or depressed appearing.      Assessment     Assessment  Diabetes 2012 (Metformin intolerant 10/2019) HTN Dyslipidemia Asthma Menopause at age 61 Rosacea GI: --RUQ pain: Ultrasound and HIDA scan negative (02-2015, 03-2015) -- 07/2016--  H Pylori  (+) , partially treated biaxin/flagel, poor tolerance  --constipation  Stress test 2012 neg , elevated BP +FH CAD brother MI 60 y/o  PLAN  DM: Last A1c 7.1, Rx Metformin, could not tolerate it, now on Januvia, good compliance and tolerance, no recent ambulatory CBGs, diet has not been good lately.  Check a A1c, stressed the need to eat healthy (low carbs, low-salt) HTN: Good compliance with losartan HCT, ambulatory BPs typically less than 140/80s.  No change, check BMP Preventive care:  Request a flu shot today.  Done. RTC 4 months CPX   This visit occurred during the SARS-CoV-2 public health emergency.  Safety protocols were in place, including screening questions prior to the visit, additional usage of staff PPE, and extensive cleaning of exam room while observing appropriate contact time as indicated for disinfecting solutions.

## 2020-03-02 NOTE — Progress Notes (Signed)
Pre visit review using our clinic review tool, if applicable. No additional management support is needed unless otherwise documented below in the visit note. 

## 2020-03-03 LAB — HEMOGLOBIN A1C
Hgb A1c MFr Bld: 6.5 % of total Hgb — ABNORMAL HIGH (ref <5.7)
Mean Plasma Glucose: 140 (calc)
eAG (mmol/L): 7.7 (calc)

## 2020-03-03 LAB — BASIC METABOLIC PANEL
BUN: 14 mg/dL (ref 7–25)
CO2: 25 mmol/L (ref 20–32)
Calcium: 9.7 mg/dL (ref 8.6–10.4)
Chloride: 105 mmol/L (ref 98–110)
Creat: 0.65 mg/dL (ref 0.50–1.05)
Glucose, Bld: 115 mg/dL — ABNORMAL HIGH (ref 65–99)
Potassium: 4 mmol/L (ref 3.5–5.3)
Sodium: 139 mmol/L (ref 135–146)

## 2020-03-04 NOTE — Assessment & Plan Note (Signed)
DM: Last A1c 7.1, Rx Metformin, could not tolerate it, now on Januvia, good compliance and tolerance, no recent ambulatory CBGs, diet has not been good lately.  Check a A1c, stressed the need to eat healthy (low carbs, low-salt) HTN: Good compliance with losartan HCT, ambulatory BPs typically less than 140/80s.  No change, check BMP Preventive care: Request a flu shot today.  Done. RTC 4 months CPX

## 2020-04-09 ENCOUNTER — Other Ambulatory Visit: Payer: Self-pay | Admitting: Internal Medicine

## 2020-04-27 ENCOUNTER — Encounter: Payer: Self-pay | Admitting: Internal Medicine

## 2020-04-27 DIAGNOSIS — M7918 Myalgia, other site: Secondary | ICD-10-CM | POA: Diagnosis not present

## 2020-04-27 DIAGNOSIS — M79604 Pain in right leg: Secondary | ICD-10-CM | POA: Diagnosis not present

## 2020-05-16 ENCOUNTER — Other Ambulatory Visit: Payer: Self-pay | Admitting: Internal Medicine

## 2020-05-23 ENCOUNTER — Ambulatory Visit (INDEPENDENT_AMBULATORY_CARE_PROVIDER_SITE_OTHER): Payer: BC Managed Care – PPO | Admitting: Internal Medicine

## 2020-05-23 ENCOUNTER — Other Ambulatory Visit: Payer: Self-pay

## 2020-05-23 ENCOUNTER — Encounter: Payer: Self-pay | Admitting: Internal Medicine

## 2020-05-23 VITALS — BP 121/76 | HR 90 | Temp 97.5°F | Resp 16 | Ht 59.0 in | Wt 155.0 lb

## 2020-05-23 DIAGNOSIS — H9311 Tinnitus, right ear: Secondary | ICD-10-CM | POA: Diagnosis not present

## 2020-05-23 NOTE — Progress Notes (Signed)
Subjective:    Patient ID: Barbara Flores, female    DOB: 06-Dec-1960, 59 y.o.   MRN: [id number]  DOS:  05/23/2020 Type of visit - description: acute  Having right-sided tinnitus for the last month, onset was acute, associated with some ear pain. Denies any hearing problems. No ear discharge or bleeding. No history of barotrauma  Review of Systems Denies dizziness per se or spinning but feels slightly off balance. Denies fever chills. No sinus pain or congestion. No decreased hearing   Past Medical History:  Diagnosis Date  . Allergy   . Asthma   . Diabetes mellitus 08/23/2010  . GERD (gastroesophageal reflux disease)   . History of exercise stress test 07-2010   normal except for elevated BP  . Hyperlipidemia   . Hypertension   . Menopause   . Rosacea     Past Surgical History:  Procedure Laterality Date  . Eardrum surgery    . HEMORRHOID SURGERY    . NASAL SINUS SURGERY    . OTHER SURGICAL HISTORY     female surgery after miscarriage, ? D&C or edometrial surgery    Allergies as of 05/23/2020      Reactions   Crab [shellfish Allergy]    Hydrocodone Nausea Only   Penicillins    vomitting   Amoxicillin Rash      Medication List       Accurate as of May 23, 2020 11:59 PM. If you have any questions, ask your nurse or doctor.        albuterol 108 (90 Base) MCG/ACT inhaler Commonly known as: Ventolin HFA Inhale 2 puffs into the lungs every 6 (six) hours as needed for wheezing.   aspirin 81 MG tablet Take 81 mg by mouth daily.   azelastine 0.1 % nasal spray Commonly known as: ASTELIN Place 2 sprays into both nostrils at bedtime as needed for rhinitis. Use in each nostril as directed   Finacea 15 % cream Generic drug: Azelaic Acid   fluticasone 110 MCG/ACT inhaler Commonly known as: Flovent HFA Inhale 2 puffs into the lungs 2 (two) times daily.   fluticasone 50 MCG/ACT nasal spray Commonly known as: FLONASE Place 2 sprays into both nostrils  daily.   ibuprofen 600 MG tablet Commonly known as: ADVIL Take 1 tablet (600 mg total) by mouth every 8 (eight) hours as needed.   levocetirizine 5 MG tablet Commonly known as: XYZAL Take 1 tablet (5 mg total) by mouth every evening.   losartan-hydrochlorothiazide 100-25 MG tablet Commonly known as: HYZAAR Take 1 tablet by mouth daily.   simvastatin 40 MG tablet Commonly known as: ZOCOR Take 1 tablet (40 mg total) by mouth at bedtime.   sitaGLIPtin 100 MG tablet Commonly known as: Januvia Take 1 tablet (100 mg total) by mouth daily.          Objective:   Physical Exam BP 121/76 (BP Location: Left Arm, Patient Position: Sitting, Cuff Size: Small)   Pulse 90   Temp (!) 97.5 F (36.4 C) (Oral)   Resp 16   Ht 4\' 11"  (1.499 m)   Wt 155 lb (70.3 kg)   SpO2 100%   BMI 31.31 kg/m  General:   Well developed, NAD, BMI noted. HEENT:  Normocephalic . Face symmetric, atraumatic Left ear: TM with some scar tissue but otherwise everything looks healthy Right ear: TM with also some scar tissue but no redness, no swelling.  Canal normal. Mastoid percussion: Normal bilaterally EOMI Lower extremities: no pretibial edema  bilaterally  Skin: Not pale. Not jaundice Neurologic:  alert & oriented X3.  Speech normal, gait appropriate for age and unassisted Psych--  Cognition and judgment appear intact.  Cooperative with normal attention span and concentration.  Behavior appropriate. No anxious or depressed appearing.      Assessment       Assessment  Diabetes 2012 (Metformin intolerant 10/2019) HTN Dyslipidemia Asthma Menopause at age 22 Rosacea GI: --RUQ pain: Ultrasound and HIDA scan negative (02-2015, 03-2015) -- 07/2016--  H Pylori  (+) , partially treated biaxin/flagel, poor tolerance  --constipation  Stress test 2012 neg , elevated BP +FH CAD brother MI 63 y/o  PLAN Tinnitus associated with otalgia: Acute onset 1 month ago, no history of trauma, remote history of  ear tubes on the right. Denies any decreased hearing. Etiology not completely clear to me, refer to ENT.  This visit occurred during the SARS-CoV-2 public health emergency.  Safety protocols were in place, including screening questions prior to the visit, additional usage of staff PPE, and extensive cleaning of exam room while observing appropriate contact time as indicated for disinfecting solutions.

## 2020-05-23 NOTE — Patient Instructions (Addendum)
Per our records you are due for an eye exam. Please contact your eye doctor to schedule an appointment. Please have them send copies of your office visit notes to [place]. Our fax number is [phone].  Will refer you to ENT

## 2020-05-23 NOTE — Progress Notes (Signed)
Pre visit review using our clinic review tool, if applicable. No additional management support is needed unless otherwise documented below in the visit note. 

## 2020-05-24 NOTE — Assessment & Plan Note (Signed)
Tinnitus associated with otalgia: Acute onset 1 month ago, no history of trauma, remote history of ear tubes on the right. Denies any decreased hearing. Etiology not completely clear to me, refer to ENT.

## 2020-06-10 DIAGNOSIS — Z20822 Contact with and (suspected) exposure to covid-19: Secondary | ICD-10-CM | POA: Diagnosis not present

## 2020-06-26 ENCOUNTER — Other Ambulatory Visit: Payer: Self-pay

## 2020-06-26 ENCOUNTER — Ambulatory Visit (INDEPENDENT_AMBULATORY_CARE_PROVIDER_SITE_OTHER): Payer: Self-pay | Admitting: Family Medicine

## 2020-06-26 ENCOUNTER — Ambulatory Visit: Payer: Self-pay

## 2020-06-26 VITALS — BP 140/70 | Ht 59.0 in | Wt 155.0 lb

## 2020-06-26 DIAGNOSIS — M79604 Pain in right leg: Secondary | ICD-10-CM

## 2020-06-26 DIAGNOSIS — S76311D Strain of muscle, fascia and tendon of the posterior muscle group at thigh level, right thigh, subsequent encounter: Secondary | ICD-10-CM | POA: Insufficient documentation

## 2020-06-26 DIAGNOSIS — S76311A Strain of muscle, fascia and tendon of the posterior muscle group at thigh level, right thigh, initial encounter: Secondary | ICD-10-CM

## 2020-06-26 MED ORDER — PREDNISONE 5 MG PO TABS: ORAL_TABLET | ORAL | 0 refills | Status: DC

## 2020-06-26 NOTE — Progress Notes (Signed)
Barbara Flores - 60 y.o. female MRN [id number]  Date of birth: [birth year]  SUBJECTIVE:  Including CC & ROS.  No chief complaint on file.   Barbara Flores is a 60 y.o. female that is presenting with right hamstring pain.  The pain has been ongoing for 3 to 4 months.  She has gone through therapy with limited improvement.  The pain is intermittent in nature.  It seems to be staying the same.  Denies any specific inciting event.  No prior history of similar pain.  No previous surgery.  Seems to be worse with certain movements.  Has not tried any medications.   Review of Systems See HPI   HISTORY: Past Medical, Surgical, Social, and Family History Reviewed & Updated per EMR.   Pertinent Historical Findings include:  Past Medical History:  Diagnosis Date  . Allergy   . Asthma   . Diabetes mellitus 08/23/2010  . GERD (gastroesophageal reflux disease)   . History of exercise stress test 07-2010   normal except for elevated BP  . Hyperlipidemia   . Hypertension   . Menopause   . Rosacea     Past Surgical History:  Procedure Laterality Date  . Eardrum surgery    . HEMORRHOID SURGERY    . NASAL SINUS SURGERY    . OTHER SURGICAL HISTORY     female surgery after miscarriage, ? D&C or edometrial surgery    Family History  Problem Relation Age of Onset  . Diabetes Mother   . Hyperlipidemia Mother   . Hypertension Mother   . Stroke Mother 81  . Kidney failure Mother   . Stomach cancer Father        deceased  . Breast cancer Sister 53  . Heart attack Brother 66  . Colon cancer Neg Hx   . Esophageal cancer Neg Hx   . Rectal cancer Neg Hx     Social History   Socioeconomic History  . Marital status: Married    Spouse name: Not on file  . Number of children: 2  . Years of education: Not on file  . Highest education level: Not on file  Occupational History  . Occupation: Health visitor: [employer]  Tobacco Use  . Smoking status: Never Smoker  . Smokeless tobacco: Never Used   Substance and Sexual Activity  . Alcohol use: Yes    Alcohol/week: 0.0 standard drinks    Comment: red wine glass rarely   . Drug use: No  . Sexual activity: Not on file  Other Topics Concern  . Not on file  Social History Narrative   Household --pt, husband    2 children-- son 69, daughter 60     Social Determinants of Radio broadcast assistant Strain: Not on Art therapist Insecurity: Not on file  Transportation Needs: Not on file  Physical Activity: Not on file  Stress: Not on file  Social Connections: Not on file  Intimate Partner Violence: Not on file     PHYSICAL EXAM:  VS: BP 140/70   Ht 4\' 11"  (1.499 m)   Wt 155 lb (70.3 kg)   BMI 31.31 kg/m  Physical Exam Gen: NAD, alert, cooperative with exam, well-appearing MSK:  Right leg: Normal internal and external rotation of the hip. Normal strength with hip flexion. Exacerbation of symptoms with resistance to knee flexion. Tenderness to palpation over the mid belly of the hamstrings. No swelling or ecchymosis. Neurovascular intact  Limited ultrasound: Right hamstring:  There  appears to be increased hyperemia of the proximal biceps femoris along the medial border of the fascial layer.  No specific muscular defect appreciated. There also appears to be increased hyperemia at the distal portion of the semimembranosus.  No specific structural defects of the muscle itself.  Summary: Different areas of hyperemia that would suggest a strain.  No suggestion of infection.  Ultrasound and interpretation by Clearance Coots, MD    ASSESSMENT & PLAN:   Hamstring strain, right, initial encounter Has different areas of increased hyperemia of the hamstring muscles.  No specific structural defects.  Could be associated with a inflammatory origin.  Seems less likely for myositis radicular symptoms. -Counseled on home exercise therapy supportive care. -Prednisone. -Consult compression. -If no improvement consider lab work  or imaging

## 2020-06-26 NOTE — Assessment & Plan Note (Signed)
Has different areas of increased hyperemia of the hamstring muscles.  No specific structural defects.  Could be associated with a inflammatory origin.  Seems less likely for myositis radicular symptoms. -Counseled on home exercise therapy supportive care. -Prednisone. -Consult compression. -If no improvement consider lab work or imaging

## 2020-06-26 NOTE — Patient Instructions (Signed)
Nice to meet you Please try compression  Please try heat  Please try the prednisone and watch your sugar Please try the exercises when the pain improves.   Please send me a message in MyChart with any questions or updates.  Please see me back in 4 weeks.   --Dr. Raeford Razor

## 2020-06-28 ENCOUNTER — Ambulatory Visit: Payer: BC Managed Care – PPO | Admitting: Sports Medicine

## 2020-07-26 ENCOUNTER — Other Ambulatory Visit: Payer: Self-pay

## 2020-07-27 ENCOUNTER — Other Ambulatory Visit (INDEPENDENT_AMBULATORY_CARE_PROVIDER_SITE_OTHER): Payer: BC Managed Care – PPO

## 2020-07-27 ENCOUNTER — Other Ambulatory Visit: Payer: Self-pay

## 2020-07-27 ENCOUNTER — Ambulatory Visit (INDEPENDENT_AMBULATORY_CARE_PROVIDER_SITE_OTHER): Payer: BC Managed Care – PPO | Admitting: Internal Medicine

## 2020-07-27 ENCOUNTER — Encounter: Payer: Self-pay | Admitting: Internal Medicine

## 2020-07-27 ENCOUNTER — Telehealth (INDEPENDENT_AMBULATORY_CARE_PROVIDER_SITE_OTHER): Payer: BC Managed Care – PPO | Admitting: Family Medicine

## 2020-07-27 VITALS — BP 157/92 | HR 69 | Temp 97.6°F | Ht 59.0 in | Wt 153.6 lb

## 2020-07-27 DIAGNOSIS — S76311D Strain of muscle, fascia and tendon of the posterior muscle group at thigh level, right thigh, subsequent encounter: Secondary | ICD-10-CM | POA: Diagnosis not present

## 2020-07-27 DIAGNOSIS — E785 Hyperlipidemia, unspecified: Secondary | ICD-10-CM | POA: Diagnosis not present

## 2020-07-27 DIAGNOSIS — Z23 Encounter for immunization: Secondary | ICD-10-CM | POA: Diagnosis not present

## 2020-07-27 DIAGNOSIS — E119 Type 2 diabetes mellitus without complications: Secondary | ICD-10-CM

## 2020-07-27 DIAGNOSIS — Z0000 Encounter for general adult medical examination without abnormal findings: Secondary | ICD-10-CM | POA: Diagnosis not present

## 2020-07-27 LAB — CBC WITH DIFFERENTIAL/PLATELET
Basophils Absolute: 0 10*3/uL (ref 0.0–0.1)
Basophils Relative: 0.8 % (ref 0.0–3.0)
Eosinophils Absolute: 0.1 10*3/uL (ref 0.0–0.7)
Eosinophils Relative: 1.4 % (ref 0.0–5.0)
HCT: 38.8 % (ref 36.0–46.0)
Hemoglobin: 13.5 g/dL (ref 12.0–15.0)
Lymphocytes Relative: 37.2 % (ref 12.0–46.0)
Lymphs Abs: 2.4 10*3/uL (ref 0.7–4.0)
MCHC: 34.9 g/dL (ref 30.0–36.0)
MCV: 85.6 fl (ref 78.0–100.0)
Monocytes Absolute: 0.5 10*3/uL (ref 0.1–1.0)
Monocytes Relative: 7.4 % (ref 3.0–12.0)
Neutro Abs: 3.4 10*3/uL (ref 1.4–7.7)
Neutrophils Relative %: 53.2 % (ref 43.0–77.0)
Platelets: 285 10*3/uL (ref 150.0–400.0)
RBC: 4.54 Mil/uL (ref 3.87–5.11)
RDW: 13.6 % (ref 11.5–15.5)
WBC: 6.3 10*3/uL (ref 4.0–10.5)

## 2020-07-27 LAB — COMPREHENSIVE METABOLIC PANEL
ALT: 23 U/L (ref 0–35)
AST: 22 U/L (ref 0–37)
Albumin: 4.8 g/dL (ref 3.5–5.2)
Alkaline Phosphatase: 77 U/L (ref 39–117)
BUN: 10 mg/dL (ref 6–23)
CO2: 28 mEq/L (ref 19–32)
Calcium: 9.9 mg/dL (ref 8.4–10.5)
Chloride: 103 mEq/L (ref 96–112)
Creatinine, Ser: 0.72 mg/dL (ref 0.40–1.20)
GFR: 92.8 mL/min (ref >60.00)
Glucose, Bld: 116 mg/dL — ABNORMAL HIGH (ref 70–99)
Potassium: 3.7 mEq/L (ref 3.5–5.1)
Sodium: 143 mEq/L (ref 135–145)
Total Bilirubin: 0.5 mg/dL (ref 0.2–1.2)
Total Protein: 8.1 g/dL (ref 6.0–8.3)

## 2020-07-27 LAB — LIPID PANEL
Cholesterol: 208 mg/dL — ABNORMAL HIGH (ref 0–200)
HDL: 61.7 mg/dL (ref >39.00)
LDL Cholesterol: 128 mg/dL — ABNORMAL HIGH (ref 0–99)
NonHDL: 145.88
Total CHOL/HDL Ratio: 3
Triglycerides: 91 mg/dL (ref 0.0–149.0)
VLDL: 18.2 mg/dL (ref 0.0–40.0)

## 2020-07-27 LAB — FECAL OCCULT BLOOD, IMMUNOCHEMICAL: Fecal Occult Bld: NEGATIVE

## 2020-07-27 LAB — HEMOGLOBIN A1C: Hgb A1c MFr Bld: 7.1 % — ABNORMAL HIGH (ref 4.6–6.5)

## 2020-07-27 NOTE — Patient Instructions (Addendum)
Take vitamin D OTC: 1000 or 2000 units a day  Check the  blood pressure  weekly   BP GOAL is between 110/65 and  135/85. If it is consistently higher or lower, let me know Return the stoll test  GO TO THE LAB : Get the blood work     [place], [place] back for   4 to 6 months     Advance Directive  Advance directives are legal documents that allow you to make decisions about your health care and medical treatment in case you become unable to communicate for yourself. Advance directives let your wishes be known to family, friends, and health care providers. Discussing and writing advance directives should happen over time rather than all at once. Advance directives can be changed and updated at any time. There are different types of advance directives, such as:  Medical power of attorney.  Living will.  Do not resuscitate (DNR) order or do not attempt resuscitation (DNAR) order. Health care proxy and medical power of attorney A health care proxy is also called a health care agent. This person is appointed to make medical decisions for you when you are unable to make decisions for yourself. Generally, people ask a trusted friend or family member to act as their proxy and represent their preferences. Make sure you have an agreement with your trusted person to act as your proxy. A proxy may have to make a medical decision on your behalf if your wishes are not known. A medical power of attorney, also called a durable power of attorney for health care, is a legal document that names your health care proxy. Depending on the laws in your state, the document may need to be:  Signed.  Notarized.  Dated.  Copied.  Witnessed.  Incorporated into your medical record. You may also want to appoint a trusted person to manage your money in the event you are unable to do so. This is called a durable power of attorney for finances. It is a separate legal  document from the durable power of attorney for health care. You may choose your health care proxy or someone different to act as your agent in money matters. If you do not appoint a proxy, or there is a concern that the proxy is not acting in your best interest, a court may appoint a guardian to act on your behalf. Living will A living will is a set of instructions that state your wishes about medical care when you cannot express them yourself. Health care providers should keep a copy of your living will in your medical record. You may want to give a copy to family members or friends. To alert caregivers in case of an emergency, you can place a card in your wallet to let them know that you have a living will and where they can find it. A living will is used if you become:  Terminally ill.  Disabled.  Unable to communicate or make decisions. The following decisions should be included in your living will:  To use or not to use life support equipment, such as dialysis machines and breathing machines (ventilators).  Whether you want a DNR or DNAR order. This tells health care providers not to use cardiopulmonary resuscitation (CPR) if breathing or heartbeat stops.  To use or not to use tube feeding.  To be given or not to be given food and fluids.  Whether you want comfort (palliative)  care when the goal becomes comfort rather than a cure.  Whether you want to donate your organs and tissues. A living will does not give instructions for distributing your money and property if you should pass away. DNR or DNAR A DNR or DNAR order is a request not to have CPR in the event that your heart stops beating or you stop breathing. If a DNR or DNAR order has not been made and shared, a health care provider will try to help any patient whose heart has stopped or who has stopped breathing. If you plan to have surgery, talk with your health care provider about how your DNR or DNAR order will be followed if  problems occur. What if I do not have an advance directive? Some states assign family decision makers to act on your behalf if you do not have an advance directive. Each state has its own laws about advance directives. You may want to check with your health care provider, attorney, or state representative about the laws in your state. Summary  Advance directives are legal documents that allow you to make decisions about your health care and medical treatment in case you become unable to communicate for yourself.  The process of discussing and writing advance directives should happen over time. You can change and update advance directives at any time.  Advance directives may include a medical power of attorney, a living will, and a DNR or DNAR order. This information is not intended to replace advice given to you by your health care provider. Make sure you discuss any questions you have with your health care provider. Document Revised: 03/06/2020 Document Reviewed: 03/06/2020 Elsevier Patient Education  2021 [employer].

## 2020-07-27 NOTE — Assessment & Plan Note (Signed)
Had a significant improvement with the prednisone. Unclear if medication related. Has good response with home exercises.  - counseled on home exercise therapy and supportive care - CK, ANA panel, sed rate and CRP - could consider PT

## 2020-07-27 NOTE — Progress Notes (Signed)
Subjective:    Patient ID: Barbara Flores, female    DOB: 1960-09-08, 60 y.o.   MRN: [id number]  DOS:  07/27/2020 Type of visit - description: Here for CPX Since the last office visit is doing well. Occasionally has anxiety  Review of Systems  Other than above, a 14 point review of systems is negative     Past Medical History:  Diagnosis Date  . Allergy   . Asthma   . Diabetes mellitus 08/23/2010  . GERD (gastroesophageal reflux disease)   . History of exercise stress test 07-2010   normal except for elevated BP  . Hyperlipidemia   . Hypertension   . Menopause   . Rosacea     Past Surgical History:  Procedure Laterality Date  . Eardrum surgery    . HEMORRHOID SURGERY    . NASAL SINUS SURGERY    . OTHER SURGICAL HISTORY     female surgery after miscarriage, ? D&C or edometrial surgery    Allergies as of 07/27/2020      Reactions   Crab [shellfish Allergy]    Hydrocodone Nausea Only   Penicillins    vomitting   Amoxicillin Rash      Medication List       Accurate as of July 27, 2020 11:59 PM. If you have any questions, ask your nurse or doctor.        STOP taking these medications   predniSONE 5 MG tablet Commonly known as: DELTASONE Stopped by: Kathlene November, MD     TAKE these medications   albuterol 108 (90 Base) MCG/ACT inhaler Commonly known as: Ventolin HFA Inhale 2 puffs into the lungs every 6 (six) hours as needed for wheezing.   aspirin 81 MG tablet Take 81 mg by mouth daily.   azelastine 0.1 % nasal spray Commonly known as: ASTELIN Place 2 sprays into both nostrils at bedtime as needed for rhinitis. Use in each nostril as directed   Finacea 15 % cream Generic drug: Azelaic Acid   fluticasone 110 MCG/ACT inhaler Commonly known as: Flovent HFA Inhale 2 puffs into the lungs 2 (two) times daily.   fluticasone 50 MCG/ACT nasal spray Commonly known as: FLONASE Place 2 sprays into both nostrils daily.   ibuprofen 600 MG tablet Commonly  known as: ADVIL Take 1 tablet (600 mg total) by mouth every 8 (eight) hours as needed.   levocetirizine 5 MG tablet Commonly known as: XYZAL Take 1 tablet (5 mg total) by mouth every evening.   losartan-hydrochlorothiazide 100-25 MG tablet Commonly known as: HYZAAR Take 1 tablet by mouth daily.   simvastatin 40 MG tablet Commonly known as: ZOCOR Take 1 tablet (40 mg total) by mouth at bedtime.   sitaGLIPtin 100 MG tablet Commonly known as: Januvia Take 1 tablet (100 mg total) by mouth daily.          Objective:   Physical Exam BP (!) 157/92 (BP Location: Left Arm, Patient Position: Sitting, Cuff Size: Large)   Pulse 69   Temp 97.6 F (36.4 C) (Oral)   Ht 4\' 11"  (1.499 m)   Wt 153 lb 9.6 oz (69.7 kg)   SpO2 100%   BMI 31.02 kg/m  General: Well developed, NAD, BMI noted Neck: No  thyromegaly  HEENT:  Normocephalic . Face symmetric, atraumatic Lungs:  CTA B Normal respiratory effort, no intercostal retractions, no accessory muscle use. Heart: RRR,  no murmur.  Abdomen:  Not distended, soft, non-tender. No rebound or rigidity.   DM  foot exam: No edema, good pedal pulses, pinprick examination normal Skin: Exposed areas without rash. Not pale. Not jaundice Neurologic:  alert & oriented X3.  Speech normal, gait appropriate for age and unassisted Strength symmetric and appropriate for age.  Psych: Cognition and judgment appear intact.  Cooperative with normal attention span and concentration.  Behavior appropriate. No anxious or depressed appearing. Avoid    Assessment    Assessment  Diabetes 2012 (Metformin intolerant 10/2019) HTN Dyslipidemia Asthma Menopause at age 48 Rosacea GI: --RUQ pain: Ultrasound and HIDA scan negative (02-2015, 03-2015) -- 07/2016--  H Pylori  (+) , partially treated biaxin/flagel, poor tolerance  --constipation  Stress test 2012 neg , elevated BP +FH CAD brother MI 18 y/o  PLAN Here for CPX DM: On Januvia, foot exam normal,  check A1c HTN: On Hyzaar, checking labs, BP today slightly elevated, normal at home, continue checking Anxiety: Occasionally has anxiety, declines any medication.  Observation for now. Tinnitus: See last visit, self resolved RTC 4 to 6 months     This visit occurred during the SARS-CoV-2 public health emergency.  Safety protocols were in place, including screening questions prior to the visit, additional usage of staff PPE, and extensive cleaning of exam room while observing appropriate contact time as indicated for disinfecting solutions.

## 2020-07-27 NOTE — Progress Notes (Signed)
Virtual Visit via Video Note  I connected with Barbara Flores on 07/27/20 at  9:30 AM EST by a video enabled telemedicine application and verified that I am speaking with the correct person using two identifiers.  Location: Patient: vehicle  Provider: home   I discussed the limitations of evaluation and management by telemedicine and the availability of in person appointments. The patient expressed understanding and agreed to proceed.  History of Present Illness:  [name] is a [name] that is following up for her hamstring pain. She did get significant improvement with the prednisone. Still has minor pain that the exercises do help with. Takes tylenol as needed.    Observations/Objective:  Gen: NAD, alert, cooperative with exam, well-appearing  Assessment and Plan:  Hamstring strain, subsequent encounter:  Had a significant improvement with the prednisone. Unclear if medication related. Has good response with home exercises.  - counseled on home exercise therapy and supportive care - CK, ANA panel, sed rate and CRP - could consider PT  Follow Up Instructions:    I discussed the assessment and treatment plan with the patient. The patient was provided an opportunity to ask questions and all were answered. The patient agreed with the plan and demonstrated an understanding of the instructions.   The patient was advised to call back or seek an in-person evaluation if the symptoms worsen or if the condition fails to improve as anticipated.  Clearance Coots, MD

## 2020-07-29 ENCOUNTER — Encounter: Payer: Self-pay | Admitting: Internal Medicine

## 2020-07-29 NOTE — Assessment & Plan Note (Signed)
-  Td 2012, booster today - pnm shot 2015, prevnar 2016 -shingrix d/w pt , elected to wait  -covid vax:x3  - had a flu shot  -JKQ:ASUORVIFBPP 07-2015 (-), next 10 years . iFOB in 5 years?:  She agreed to start doing IFOBs -Female care per gynecologistrec to see gyn regulalrly , had a MMG6/2021 ([facility])  -Patient education: Diet, exercise, advance directives -Bone health: Menopause at age 60,  RX DEXA5 years after menopause. - Labs: CMP, FLP, CBC, A1c.

## 2020-07-29 NOTE — Assessment & Plan Note (Signed)
Here for CPX DM: On Januvia, foot exam normal, check A1c HTN: On Hyzaar, checking labs, BP today slightly elevated, normal at home, continue checking Anxiety: Occasionally has anxiety, declines any medication.  Observation for now. Tinnitus: See last visit, self resolved RTC 4 to 6 months

## 2020-07-31 ENCOUNTER — Other Ambulatory Visit: Payer: Self-pay | Admitting: Family Medicine

## 2020-07-31 DIAGNOSIS — S76311D Strain of muscle, fascia and tendon of the posterior muscle group at thigh level, right thigh, subsequent encounter: Secondary | ICD-10-CM

## 2020-08-02 MED ORDER — DAPAGLIFLOZIN PROPANEDIOL 5 MG PO TABS: 5.0000 mg | ORAL_TABLET | Freq: Every day | ORAL | 2 refills | Status: DC

## 2020-08-02 MED ORDER — PRAVASTATIN SODIUM 20 MG PO TABS: 20.0000 mg | ORAL_TABLET | Freq: Every evening | ORAL | 2 refills | Status: DC

## 2020-08-02 NOTE — Addendum Note (Signed)
Addended by: Tora Kindred on: 08/02/2020 02:28 PM   Modules accepted: Orders

## 2020-08-03 DIAGNOSIS — S76311D Strain of muscle, fascia and tendon of the posterior muscle group at thigh level, right thigh, subsequent encounter: Secondary | ICD-10-CM | POA: Diagnosis not present

## 2020-08-07 LAB — ANA,IFA RA DIAG PNL W/RFLX TIT/PATN
ANA Titer 1: NEGATIVE
Cyclic Citrullin Peptide Ab: 15 units (ref 0–19)
Rheumatoid fact SerPl-aCnc: <10 IU/mL (ref <14.0)

## 2020-08-07 LAB — C-REACTIVE PROTEIN: CRP: 2 mg/L (ref 0–10)

## 2020-08-07 LAB — CK: Total CK: 183 U/L — ABNORMAL HIGH (ref 32–182)

## 2020-08-07 LAB — SEDIMENTATION RATE: Sed Rate: 47 mm/hr — ABNORMAL HIGH (ref 0–40)

## 2020-08-08 ENCOUNTER — Telehealth: Payer: Self-pay | Admitting: Family Medicine

## 2020-08-08 NOTE — Telephone Encounter (Signed)
Left VM for patient. If she calls back please have her speak with a nurse/CMA and inform that her labwork was normal. There were lab values that were slightly elevated but these are just outside the scope of being completely normal. We will continue with current plan.   If any questions then please take the best time and phone number to call and I will try to call her back.   Rosemarie Ax, MD [facility] 08/08/2020, 2:48 PM

## 2020-08-08 NOTE — Telephone Encounter (Signed)
Pt informed of below.  

## 2020-08-14 DIAGNOSIS — E119 Type 2 diabetes mellitus without complications: Secondary | ICD-10-CM | POA: Diagnosis not present

## 2020-09-13 ENCOUNTER — Other Ambulatory Visit (INDEPENDENT_AMBULATORY_CARE_PROVIDER_SITE_OTHER): Payer: BC Managed Care – PPO

## 2020-09-13 ENCOUNTER — Other Ambulatory Visit: Payer: Self-pay

## 2020-09-13 DIAGNOSIS — E119 Type 2 diabetes mellitus without complications: Secondary | ICD-10-CM

## 2020-09-13 DIAGNOSIS — E785 Hyperlipidemia, unspecified: Secondary | ICD-10-CM | POA: Diagnosis not present

## 2020-09-13 LAB — LIPID PANEL
Cholesterol: 223 mg/dL — ABNORMAL HIGH (ref 0–200)
HDL: 60.6 mg/dL (ref >39.00)
LDL Cholesterol: 145 mg/dL — ABNORMAL HIGH (ref 0–99)
NonHDL: 162.6
Total CHOL/HDL Ratio: 4
Triglycerides: 90 mg/dL (ref 0.0–149.0)
VLDL: 18 mg/dL (ref 0.0–40.0)

## 2020-09-13 LAB — COMPREHENSIVE METABOLIC PANEL
ALT: 20 U/L (ref 0–35)
AST: 18 U/L (ref 0–37)
Albumin: 4.6 g/dL (ref 3.5–5.2)
Alkaline Phosphatase: 79 U/L (ref 39–117)
BUN: 22 mg/dL (ref 6–23)
CO2: 25 mEq/L (ref 19–32)
Calcium: 9.3 mg/dL (ref 8.4–10.5)
Chloride: 101 mEq/L (ref 96–112)
Creatinine, Ser: 0.75 mg/dL (ref 0.40–1.20)
GFR: 88.28 mL/min (ref >60.00)
Glucose, Bld: 135 mg/dL — ABNORMAL HIGH (ref 70–99)
Potassium: 3.7 mEq/L (ref 3.5–5.1)
Sodium: 138 mEq/L (ref 135–145)
Total Bilirubin: 0.5 mg/dL (ref 0.2–1.2)
Total Protein: 7.6 g/dL (ref 6.0–8.3)

## 2020-09-14 ENCOUNTER — Other Ambulatory Visit: Payer: Self-pay

## 2020-09-14 DIAGNOSIS — E119 Type 2 diabetes mellitus without complications: Secondary | ICD-10-CM | POA: Diagnosis not present

## 2020-09-14 MED ORDER — PRAVASTATIN SODIUM 40 MG PO TABS: 40.0000 mg | ORAL_TABLET | Freq: Every day | ORAL | 1 refills | Status: DC

## 2020-09-14 NOTE — Addendum Note (Signed)
Addended byDamita Dunnings D on: 09/14/2020 11:33 AM   Modules accepted: Orders

## 2020-09-14 NOTE — Addendum Note (Signed)
Addended byDamita Dunnings D on: 09/14/2020 11:32 AM   Modules accepted: Orders

## 2020-10-04 ENCOUNTER — Encounter: Payer: Self-pay | Admitting: Internal Medicine

## 2020-10-14 DIAGNOSIS — E119 Type 2 diabetes mellitus without complications: Secondary | ICD-10-CM | POA: Diagnosis not present

## 2020-11-10 ENCOUNTER — Other Ambulatory Visit: Payer: Self-pay | Admitting: Internal Medicine

## 2020-11-14 DIAGNOSIS — E119 Type 2 diabetes mellitus without complications: Secondary | ICD-10-CM | POA: Diagnosis not present

## 2020-11-16 ENCOUNTER — Other Ambulatory Visit: Payer: Self-pay | Admitting: Internal Medicine

## 2020-12-07 ENCOUNTER — Ambulatory Visit (INDEPENDENT_AMBULATORY_CARE_PROVIDER_SITE_OTHER): Payer: BC Managed Care – PPO | Admitting: Family Medicine

## 2020-12-07 ENCOUNTER — Other Ambulatory Visit: Payer: Self-pay

## 2020-12-07 ENCOUNTER — Encounter: Payer: Self-pay | Admitting: Family Medicine

## 2020-12-07 VITALS — BP 130/82 | HR 79 | Temp 98.2°F | Ht 59.0 in | Wt 152.2 lb

## 2020-12-07 DIAGNOSIS — J4521 Mild intermittent asthma with (acute) exacerbation: Secondary | ICD-10-CM | POA: Diagnosis not present

## 2020-12-07 MED ORDER — PREDNISONE 20 MG PO TABS
40.0000 mg | ORAL_TABLET | Freq: Every day | ORAL | 0 refills | Status: AC
Start: 2020-12-07 — End: 2020-12-12

## 2020-12-07 MED ORDER — ALBUTEROL SULFATE HFA 108 (90 BASE) MCG/ACT IN AERS: 2.0000 | INHALATION_SPRAY | Freq: Four times a day (QID) | RESPIRATORY_TRACT | 0 refills | Status: DC | PRN

## 2020-12-07 MED ORDER — FLOVENT DISKUS 100 MCG/BLIST IN AEPB: INHALATION_SPRAY | RESPIRATORY_TRACT | 2 refills | Status: AC

## 2020-12-07 NOTE — Patient Instructions (Signed)
Rinse your mouth out after using the Flovent.  Let me know if there are any cost issues.  Let us know if you need anything.

## 2020-12-07 NOTE — Addendum Note (Signed)
Addended by: Sharon Seller B on: 12/07/2020 10:48 AM   Modules accepted: Orders

## 2020-12-07 NOTE — Progress Notes (Addendum)
CC: Asthma  Barbara Flores here for asthma complaints.  Duration: 3 days  Associated symptoms: shortness of breath and chest tightness Denies: sinus congestion, sinus pain, rhinorrhea, itchy watery eyes, ear pain, ear drainage, sore throat, myalgia, and fevers, coughing Treatment to date: ICS, SABA Sick contacts: No Weather changes flare her s/s's.   Past Medical History:  Diagnosis Date   Allergy    Asthma    Diabetes mellitus 08/23/2010   GERD (gastroesophageal reflux disease)    History of exercise stress test 07-2010   normal except for elevated BP   Hyperlipidemia    Hypertension    Menopause    Rosacea     BP 130/82   Pulse 79   Temp 98.2 F (36.8 C) (Oral)   Ht 4\' 11"  (1.499 m)   Wt 152 lb 4 oz (69.1 kg)   SpO2 98%   BMI 30.75 kg/m  General: Awake, alert, appears stated age HEENT: AT, [place], ears patent b/l and TM's neg, nares patent w/o discharge, pharynx pink and without exudates, MMM Neck: No masses or asymmetry Heart: RRR Lungs: CTAB, no accessory muscle use, no wheezing noted Psych: Age appropriate judgment and insight, normal mood and affect  Mild intermittent asthma with exacerbation - Plan: predniSONE (DELTASONE) 20 MG tablet, albuterol (VENTOLIN HFA) 108 (90 Base) MCG/ACT inhaler, Fluticasone Propionate, Inhal, (FLOVENT DISKUS) 100 MCG/BLIST AEPB  Status: Exacerbation of chronic issue.  5 d pred burst, 40 mg/d. Refill SABA and ICS. Let me know if there are cost issues. Will order Neb machine. Continue to push fluids, practice good hand hygiene, cover mouth when coughing. F/u prn. If starting to experience fevers, shaking, or shortness of breath, seek immediate care. Pt voiced understanding and agreement to the plan.  [place], DO 12/07/20 10:39 AM

## 2020-12-10 ENCOUNTER — Telehealth: Payer: Self-pay

## 2020-12-10 DIAGNOSIS — J4520 Mild intermittent asthma, uncomplicated: Secondary | ICD-10-CM | POA: Diagnosis not present

## 2020-12-10 NOTE — Telephone Encounter (Signed)
Called to inform the patient name of company we sent DME order is [employer].

## 2020-12-10 NOTE — Telephone Encounter (Signed)
Pt called stating she had received a call from a company she "thinks" was associated with the nubulizer Dr. Nani Ravens order for her in her visit on Friday.  Pt would like the name of the company because she was not quick to give out her credit card information, etc to the company that called her.

## 2020-12-12 MED ORDER — ALBUTEROL SULFATE (2.5 MG/3ML) 0.083% IN NEBU: 2.5000 mg | INHALATION_SOLUTION | Freq: Four times a day (QID) | RESPIRATORY_TRACT | 1 refills | Status: DC | PRN

## 2020-12-12 NOTE — Telephone Encounter (Signed)
Patient called stating she has received the nebulizer and now she just needs the medication .

## 2020-12-12 NOTE — Telephone Encounter (Signed)
Neb solution sent. Ty.

## 2020-12-12 NOTE — Telephone Encounter (Signed)
Called and patient informed has been sent in.

## 2020-12-12 NOTE — Addendum Note (Signed)
Addended by: Ames Coupe on: 12/12/2020 11:41 AM   Modules accepted: Orders

## 2020-12-14 DIAGNOSIS — E119 Type 2 diabetes mellitus without complications: Secondary | ICD-10-CM | POA: Diagnosis not present

## 2020-12-24 ENCOUNTER — Ambulatory Visit: Payer: BC Managed Care – PPO | Admitting: Internal Medicine

## 2021-01-01 NOTE — Telephone Encounter (Signed)
Called [employer] informed we have not seen this fax. She just faxed it again and will be on the look out for it and will fax when receive it.

## 2021-01-01 NOTE — Telephone Encounter (Signed)
Curt Bears with [employer] called stating Nebulizer medical necessity form has been faxed several times with no response. She is needing it to be signed and faxed back. If not received please called back to get new copy refaxed.   Call back is [phone]  Fax: [phone]

## 2021-01-09 DIAGNOSIS — J4520 Mild intermittent asthma, uncomplicated: Secondary | ICD-10-CM | POA: Diagnosis not present

## 2021-01-11 ENCOUNTER — Ambulatory Visit (INDEPENDENT_AMBULATORY_CARE_PROVIDER_SITE_OTHER): Payer: BC Managed Care – PPO | Admitting: Internal Medicine

## 2021-01-11 ENCOUNTER — Encounter: Payer: Self-pay | Admitting: Internal Medicine

## 2021-01-11 ENCOUNTER — Other Ambulatory Visit: Payer: Self-pay

## 2021-01-11 VITALS — BP 142/86 | HR 94 | Temp 97.5°F | Resp 16 | Ht 59.0 in | Wt 153.4 lb

## 2021-01-11 DIAGNOSIS — I10 Essential (primary) hypertension: Secondary | ICD-10-CM | POA: Diagnosis not present

## 2021-01-11 DIAGNOSIS — E119 Type 2 diabetes mellitus without complications: Secondary | ICD-10-CM | POA: Diagnosis not present

## 2021-01-11 DIAGNOSIS — Z1231 Encounter for screening mammogram for malignant neoplasm of breast: Secondary | ICD-10-CM | POA: Diagnosis not present

## 2021-01-11 DIAGNOSIS — E785 Hyperlipidemia, unspecified: Secondary | ICD-10-CM

## 2021-01-11 LAB — LIPID PANEL
Cholesterol: 232 mg/dL — ABNORMAL HIGH (ref 0–200)
HDL: 67.9 mg/dL (ref >39.00)
LDL Cholesterol: 133 mg/dL — ABNORMAL HIGH (ref 0–99)
NonHDL: 163.84
Total CHOL/HDL Ratio: 3
Triglycerides: 155 mg/dL — ABNORMAL HIGH (ref 0.0–149.0)
VLDL: 31 mg/dL (ref 0.0–40.0)

## 2021-01-11 LAB — BASIC METABOLIC PANEL
BUN: 15 mg/dL (ref 6–23)
CO2: 23 mEq/L (ref 19–32)
Calcium: 10.2 mg/dL (ref 8.4–10.5)
Chloride: 102 mEq/L (ref 96–112)
Creatinine, Ser: 0.77 mg/dL (ref 0.40–1.20)
GFR: 85.34 mL/min (ref >60.00)
Glucose, Bld: 135 mg/dL — ABNORMAL HIGH (ref 70–99)
Potassium: 3.6 mEq/L (ref 3.5–5.1)
Sodium: 139 mEq/L (ref 135–145)

## 2021-01-11 LAB — HEMOGLOBIN A1C: Hgb A1c MFr Bld: 7.8 % — ABNORMAL HIGH (ref 4.6–6.5)

## 2021-01-11 NOTE — Patient Instructions (Addendum)
You are due for your yearly mammogram, we have placed an order to be done at the [facility]. Please expect a call from them.   We have placed a referral to an eye doctor for your diabetic eye exam, it is important that you start doing these at least once yearly. Please expect a call from them.    Check the  blood pressure BP GOAL is between 110/65 and  135/85. If it is consistently higher or lower, let me know  If you ever need more than 3-4 times albuterols per week let me know  GO TO THE LAB : Get the blood work     [place], [place] back for a checkup in 5 to 6 months

## 2021-01-11 NOTE — Progress Notes (Signed)
Subjective:    Patient ID: Barbara Flores, female    DOB: [birth year], 60 y.o.   MRN: [id number]  DOS:  01/11/2021 Type of visit - description: Follow-up  She feels well. Today with talk about diabetes, HTN and cholesterol. As far as her asthma, reports she uses albuterol once a week for chest tightness.  Symptoms resolved with albuterol.  Review of Systems Started Wilder Glade, denies any dysuria, gross hematuria or vaginal rash. No recent fever chills.  No chest pain or difficulty breathing No nausea or vomit  Past Medical History:  Diagnosis Date   Allergy    Asthma    Diabetes mellitus 08/23/2010   GERD (gastroesophageal reflux disease)    History of exercise stress test 07-2010   normal except for elevated BP   Hyperlipidemia    Hypertension    Menopause    Rosacea     Past Surgical History:  Procedure Laterality Date   Eardrum surgery     HEMORRHOID SURGERY     NASAL SINUS SURGERY     OTHER SURGICAL HISTORY     female surgery after miscarriage, ? D&C or edometrial surgery    Allergies as of 01/11/2021       Reactions   Crab [shellfish Allergy]    Hydrocodone Nausea Only   Penicillins    vomitting   Amoxicillin Rash        Medication List        Accurate as of January 11, 2021 11:59 PM. If you have any questions, ask your nurse or doctor.          albuterol 108 (90 Base) MCG/ACT inhaler Commonly known as: VENTOLIN HFA Inhale 2 puffs into the lungs every 6 (six) hours as needed for wheezing or shortness of breath.   albuterol (2.5 MG/3ML) 0.083% nebulizer solution Commonly known as: PROVENTIL Take 3 mLs (2.5 mg total) by nebulization every 6 (six) hours as needed for wheezing or shortness of breath.   aspirin 81 MG tablet Take 81 mg by mouth daily.   azelastine 0.1 % nasal spray Commonly known as: ASTELIN Place 2 sprays into both nostrils at bedtime as needed for rhinitis. Use in each nostril as directed   dapagliflozin propanediol 5 MG Tabs  tablet Commonly known as: Farxiga Take 1 tablet (5 mg total) by mouth daily.   Finacea 15 % gel Generic drug: Azelaic Acid   Flovent Diskus 100 MCG/BLIST Aepb Generic drug: Fluticasone Propionate (Inhal) Take 2 puffs twice daily during flares. Rinse your mouth out after use.   fluticasone 50 MCG/ACT nasal spray Commonly known as: FLONASE Place 2 sprays into both nostrils daily.   ibuprofen 600 MG tablet Commonly known as: ADVIL Take 1 tablet (600 mg total) by mouth every 8 (eight) hours as needed.   levocetirizine 5 MG tablet Commonly known as: XYZAL Take 1 tablet (5 mg total) by mouth every evening.   losartan-hydrochlorothiazide 100-25 MG tablet Commonly known as: HYZAAR Take 1 tablet by mouth daily.   pravastatin 40 MG tablet Commonly known as: PRAVACHOL Take 1 tablet (40 mg total) by mouth daily.   sitaGLIPtin 100 MG tablet Commonly known as: Januvia Take 1 tablet (100 mg total) by mouth daily.           Objective:   Physical Exam BP (!) 142/86 (BP Location: Left Arm, Patient Position: Sitting, Cuff Size: Small)   Pulse 94   Temp (!) 97.5 F (36.4 C) (Oral)   Resp 16   Ht 4'  11" (1.499 m)   Wt 153 lb 6 oz (69.6 kg)   SpO2 99%   BMI 30.98 kg/m  General:   Well developed, NAD, BMI noted. HEENT:  Normocephalic . Face symmetric, atraumatic Lungs:  CTA B Normal respiratory effort, no intercostal retractions, no accessory muscle use. Heart: RRR,  no murmur.  Lower extremities: no pretibial edema bilaterally  Skin: Not pale. Not jaundice Neurologic:  alert & oriented X3.  Speech normal, gait appropriate for age and unassisted Psych--  Cognition and judgment appear intact.  Cooperative with normal attention span and concentration.  Behavior appropriate. No anxious or depressed appearing.      Assessment     Assessment  Diabetes 2012 (Metformin intolerant 10/2019) HTN Dyslipidemia (intol to atorvastatin per patient.  Crestor DC'd 2015 due to  cost) asthma Menopause at age 83 Rosacea GI: --RUQ pain: Ultrasound and HIDA scan negative (02-2015, 03-2015) -- 07/2016--  H Pylori  (+) , partially treated biaxin/flagel, poor tolerance  --constipation  Stress test 2012 neg , elevated BP +FH CAD brother MI 55 y/o  PLAN DM: Last A1c was 7.1, on Januvia, Farxiga was added, she is intolerant to metformin. check A1c. HTN: BP today 142/86, at home reportedly "normal, continue Hyzaar, check a BMP. High cholesterol: LDL was 128 few months ago, rec to stop simvastatin and start atorvastatin but she stated is intolerant to it, thus rx pravachol 20 mg, as chol cont to be high, it was  increased to 40 mg.  Recheck FLP today.  If not at goal consider crestor, previously tolerated (cost was an issue). Asthma: On Flovent, typically reached out for Ventolin once a week, thus she is well controlled.  See instructions. Preventive care: Encouraged to have COVID booster #4 RTC 5 to 6 months   This visit occurred during the SARS-CoV-2 public health emergency.  Safety protocols were in place, including screening questions prior to the visit, additional usage of staff PPE, and extensive cleaning of exam room while observing appropriate contact time as indicated for disinfecting solutions.

## 2021-01-12 NOTE — Assessment & Plan Note (Signed)
DM: Last A1c was 7.1, on Januvia, Farxiga was added, she is intolerant to metformin. check A1c. HTN: BP today 142/86, at home reportedly "normal, continue Hyzaar, check a BMP. High cholesterol: LDL was 128 few months ago, rec to stop simvastatin and start atorvastatin but she stated is intolerant to it, thus rx pravachol 20 mg, as chol cont to be high, it was  increased to 40 mg.  Recheck FLP today.  If not at goal consider crestor, previously tolerated (cost was an issue). Asthma: On Flovent, typically reached out for Ventolin once a week, thus she is well controlled.  See instructions. Preventive care: Encouraged to have COVID booster #4 RTC 5 to 6 months

## 2021-01-14 DIAGNOSIS — E119 Type 2 diabetes mellitus without complications: Secondary | ICD-10-CM | POA: Diagnosis not present

## 2021-01-17 MED ORDER — DAPAGLIFLOZIN PROPANEDIOL 10 MG PO TABS: 10.0000 mg | ORAL_TABLET | Freq: Every day | ORAL | 4 refills | Status: DC

## 2021-01-17 MED ORDER — ROSUVASTATIN CALCIUM 20 MG PO TABS: 20.0000 mg | ORAL_TABLET | Freq: Every day | ORAL | 3 refills | Status: DC

## 2021-01-17 NOTE — Addendum Note (Signed)
Addended byDamita Dunnings D on: 01/17/2021 12:51 PM   Modules accepted: Orders

## 2021-01-19 ENCOUNTER — Other Ambulatory Visit: Payer: Self-pay | Admitting: Internal Medicine

## 2021-01-22 ENCOUNTER — Other Ambulatory Visit: Payer: Self-pay

## 2021-01-22 ENCOUNTER — Ambulatory Visit
Admission: RE | Admit: 2021-01-22 | Discharge: 2021-01-22 | Disposition: A | Discharge status: Home / Self Care | Payer: BC Managed Care – PPO | Source: Ambulatory Visit | Attending: Internal Medicine | Admitting: Internal Medicine

## 2021-01-22 DIAGNOSIS — Z1231 Encounter for screening mammogram for malignant neoplasm of breast: Secondary | ICD-10-CM | POA: Diagnosis not present

## 2021-01-25 ENCOUNTER — Ambulatory Visit: Payer: BC Managed Care – PPO | Admitting: Family Medicine

## 2021-02-09 DIAGNOSIS — J4520 Mild intermittent asthma, uncomplicated: Secondary | ICD-10-CM | POA: Diagnosis not present

## 2021-02-14 ENCOUNTER — Telehealth: Payer: Self-pay | Admitting: Internal Medicine

## 2021-02-14 DIAGNOSIS — E119 Type 2 diabetes mellitus without complications: Secondary | ICD-10-CM | POA: Diagnosis not present

## 2021-02-14 MED ORDER — ROSUVASTATIN CALCIUM 20 MG PO TABS: 20.0000 mg | ORAL_TABLET | Freq: Every day | ORAL | 0 refills | Status: DC

## 2021-02-14 NOTE — Telephone Encounter (Signed)
Rx changed to 90 day supply 

## 2021-02-14 NOTE — Telephone Encounter (Signed)
Patient would like to change her rosuvastatin (CRESTOR) 20 MG tablet from 30 day to 90 day.   Please send to:  [facility], [place]  [address] [place] [address]  Phone:  [phone]  Fax:  [phone]

## 2021-02-22 ENCOUNTER — Telehealth: Payer: Self-pay | Admitting: Internal Medicine

## 2021-02-22 MED ORDER — DAPAGLIFLOZIN PROPANEDIOL 10 MG PO TABS: 10.0000 mg | ORAL_TABLET | Freq: Every day | ORAL | 1 refills | Status: DC

## 2021-02-22 NOTE — Telephone Encounter (Signed)
Pt would like rx changed to 90day supply.  dapagliflozin propanediol (FARXIGA) 10 MG TABS tablet

## 2021-02-22 NOTE — Telephone Encounter (Signed)
Done

## 2021-03-04 DIAGNOSIS — H11153 Pinguecula, bilateral: Secondary | ICD-10-CM | POA: Diagnosis not present

## 2021-03-04 DIAGNOSIS — E1165 Type 2 diabetes mellitus with hyperglycemia: Secondary | ICD-10-CM | POA: Diagnosis not present

## 2021-03-04 DIAGNOSIS — H35033 Hypertensive retinopathy, bilateral: Secondary | ICD-10-CM | POA: Diagnosis not present

## 2021-03-04 DIAGNOSIS — H25012 Cortical age-related cataract, left eye: Secondary | ICD-10-CM | POA: Diagnosis not present

## 2021-03-04 LAB — HM DIABETES EYE EXAM

## 2021-03-05 ENCOUNTER — Encounter: Payer: Self-pay | Admitting: Internal Medicine

## 2021-03-12 DIAGNOSIS — J4520 Mild intermittent asthma, uncomplicated: Secondary | ICD-10-CM | POA: Diagnosis not present

## 2021-04-10 ENCOUNTER — Ambulatory Visit: Payer: BC Managed Care – PPO | Admitting: Internal Medicine

## 2021-04-11 DIAGNOSIS — J4520 Mild intermittent asthma, uncomplicated: Secondary | ICD-10-CM | POA: Diagnosis not present

## 2021-04-19 ENCOUNTER — Ambulatory Visit: Payer: BC Managed Care – PPO | Admitting: Internal Medicine

## 2021-04-25 ENCOUNTER — Other Ambulatory Visit: Payer: Self-pay | Admitting: Internal Medicine

## 2021-04-27 ENCOUNTER — Other Ambulatory Visit: Payer: Self-pay | Admitting: Family Medicine

## 2021-04-27 DIAGNOSIS — J4521 Mild intermittent asthma with (acute) exacerbation: Secondary | ICD-10-CM

## 2021-05-06 ENCOUNTER — Ambulatory Visit: Payer: BC Managed Care – PPO | Admitting: Internal Medicine

## 2021-05-12 DIAGNOSIS — J4520 Mild intermittent asthma, uncomplicated: Secondary | ICD-10-CM | POA: Diagnosis not present

## 2021-06-11 DIAGNOSIS — Z6832 Body mass index (BMI) 32.0-32.9, adult: Secondary | ICD-10-CM | POA: Diagnosis not present

## 2021-06-11 DIAGNOSIS — J4520 Mild intermittent asthma, uncomplicated: Secondary | ICD-10-CM | POA: Diagnosis not present

## 2021-06-11 DIAGNOSIS — Z124 Encounter for screening for malignant neoplasm of cervix: Secondary | ICD-10-CM | POA: Diagnosis not present

## 2021-06-11 DIAGNOSIS — Z01419 Encounter for gynecological examination (general) (routine) without abnormal findings: Secondary | ICD-10-CM | POA: Diagnosis not present

## 2021-06-13 LAB — RESULTS CONSOLE HPV: CHL HPV: NEGATIVE

## 2021-06-13 LAB — HM PAP SMEAR: HM Pap smear: NEGATIVE

## 2021-06-14 ENCOUNTER — Encounter: Payer: Self-pay | Admitting: Internal Medicine

## 2021-06-14 ENCOUNTER — Ambulatory Visit (INDEPENDENT_AMBULATORY_CARE_PROVIDER_SITE_OTHER): Payer: BC Managed Care – PPO | Admitting: Internal Medicine

## 2021-06-14 VITALS — BP 126/78 | HR 100 | Temp 98.2°F | Resp 16 | Ht 59.0 in | Wt 152.1 lb

## 2021-06-14 DIAGNOSIS — E785 Hyperlipidemia, unspecified: Secondary | ICD-10-CM

## 2021-06-14 DIAGNOSIS — R051 Acute cough: Secondary | ICD-10-CM | POA: Diagnosis not present

## 2021-06-14 DIAGNOSIS — I10 Essential (primary) hypertension: Secondary | ICD-10-CM

## 2021-06-14 DIAGNOSIS — E119 Type 2 diabetes mellitus without complications: Secondary | ICD-10-CM

## 2021-06-14 LAB — COMPREHENSIVE METABOLIC PANEL
ALT: 32 U/L (ref 0–35)
AST: 26 U/L (ref 0–37)
Albumin: 4.5 g/dL (ref 3.5–5.2)
Alkaline Phosphatase: 102 U/L (ref 39–117)
BUN: 11 mg/dL (ref 6–23)
CO2: 24 mEq/L (ref 19–32)
Calcium: 9.5 mg/dL (ref 8.4–10.5)
Chloride: 102 mEq/L (ref 96–112)
Creatinine, Ser: 0.76 mg/dL (ref 0.40–1.20)
GFR: 86.43 mL/min (ref >60.00)
Glucose, Bld: 157 mg/dL — ABNORMAL HIGH (ref 70–99)
Potassium: 3.8 mEq/L (ref 3.5–5.1)
Sodium: 137 mEq/L (ref 135–145)
Total Bilirubin: 0.4 mg/dL (ref 0.2–1.2)
Total Protein: 7.9 g/dL (ref 6.0–8.3)

## 2021-06-14 LAB — LIPID PANEL
Cholesterol: 161 mg/dL (ref 0–200)
HDL: 52.8 mg/dL (ref >39.00)
LDL Cholesterol: 91 mg/dL (ref 0–99)
NonHDL: 107.71
Total CHOL/HDL Ratio: 3
Triglycerides: 86 mg/dL (ref 0.0–149.0)
VLDL: 17.2 mg/dL (ref 0.0–40.0)

## 2021-06-14 LAB — HEMOGLOBIN A1C: Hgb A1c MFr Bld: 9.2 % — ABNORMAL HIGH (ref 4.6–6.5)

## 2021-06-14 MED ORDER — DOXYCYCLINE HYCLATE 100 MG PO TABS: 100.0000 mg | ORAL_TABLET | Freq: Two times a day (BID) | ORAL | 0 refills | Status: DC

## 2021-06-14 NOTE — Patient Instructions (Addendum)
Penni Homans continue with your  asthma medications  For cough: Robitussin-DM or Mucinex DM over-the-counter  For nasal congestion: Flonase 2 sprays on each side of the nose every day.  If you are not improving in the next few days: Start doxycycline, an antibiotic, prescription sent.  Call if not improving in the next 2 weeks or if he has severe symptoms.   Proceed with a COVID-vaccine in 2 to 3 weeks   GO TO THE LAB : Get the blood work     [place] Come back for a physical in 3 to 4 months

## 2021-06-14 NOTE — Progress Notes (Signed)
Subjective:    Patient ID: Barbara Flores, female    DOB: May 17, 1961, 60 y.o.   MRN: [id number]  DOS:  06/14/2021 Type of visit - description: f/u  Today with talk about diabetes, hypertension cholesterol.  Good medication compliance. We talk about her diet, room for improvement particularly the last 2 weeks when she was on vacation.   No recent ambulatory CBGs.  Also, develop cough last week, tested negative for COVID. Still coughing, occasional green mucus.  + Watery eyes, + itchy throat.  Denies fever chills. No chest pain or difficulty breathing. No myalgias No nausea or vomiting. Has asthma, good med compliance, asthma has not been exacerbated.  Review of Systems See above   Past Medical History:  Diagnosis Date   Allergy    Asthma    Diabetes mellitus 08/23/2010   GERD (gastroesophageal reflux disease)    History of exercise stress test 07-2010   normal except for elevated BP   Hyperlipidemia    Hypertension    Menopause    Rosacea     Past Surgical History:  Procedure Laterality Date   Eardrum surgery     HEMORRHOID SURGERY     NASAL SINUS SURGERY     OTHER SURGICAL HISTORY     female surgery after miscarriage, ? D&C or edometrial surgery    Allergies as of 06/14/2021       Reactions   Crab [shellfish Allergy]    Hydrocodone Nausea Only   Penicillins    vomitting   Amoxicillin Rash        Medication List        Accurate as of June 14, 2021  4:56 PM. If you have any questions, ask your nurse or doctor.          albuterol (2.5 MG/3ML) 0.083% nebulizer solution Commonly known as: PROVENTIL Take 3 mLs (2.5 mg total) by nebulization every 6 (six) hours as needed for wheezing or shortness of breath.   albuterol 108 (90 Base) MCG/ACT inhaler Commonly known as: VENTOLIN HFA INHALE 2 PUFFS BY MOUTH EVERY 6 HOURS AS NEEDED FOR WHEEZING OR SHORTNESS OF BREATH   aspirin 81 MG tablet Take 81 mg by mouth daily.   azelastine 0.1 % nasal  spray Commonly known as: ASTELIN Place 2 sprays into both nostrils at bedtime as needed for rhinitis. Use in each nostril as directed   dapagliflozin propanediol 10 MG Tabs tablet Commonly known as: Farxiga Take 1 tablet (10 mg total) by mouth daily before breakfast.   doxycycline 100 MG tablet Commonly known as: VIBRA-TABS Take 1 tablet (100 mg total) by mouth 2 (two) times daily. Started by: Kathlene November, MD   Finacea 15 % gel Generic drug: Azelaic Acid   Flovent Diskus 100 MCG/ACT Aepb Generic drug: Fluticasone Propionate (Inhal) Take 2 puffs twice daily during flares. Rinse your mouth out after use.   fluticasone 50 MCG/ACT nasal spray Commonly known as: FLONASE Place 2 sprays into both nostrils daily.   ibuprofen 600 MG tablet Commonly known as: ADVIL Take 1 tablet (600 mg total) by mouth every 8 (eight) hours as needed.   Januvia 100 MG tablet Generic drug: sitaGLIPtin Take 1 tablet by mouth once daily   levocetirizine 5 MG tablet Commonly known as: XYZAL Take 1 tablet (5 mg total) by mouth every evening.   losartan-hydrochlorothiazide 100-25 MG tablet Commonly known as: HYZAAR Take 1 tablet by mouth once daily   rosuvastatin 20 MG tablet Commonly known as: Crestor Take 1  tablet (20 mg total) by mouth at bedtime.           Objective:   Physical Exam BP 126/78 (BP Location: Left Arm, Patient Position: Sitting, Cuff Size: Small)    Pulse 100    Temp 98.2 F (36.8 C) (Oral)    Resp 16    Ht 4\' 11"  (1.499 m)    Wt 152 lb 2 oz (69 kg)    SpO2 95%    BMI 30.73 kg/m  General:   Well developed, NAD, BMI noted. HEENT:  Normocephalic . Face symmetric, atraumatic. TMs: Both are slightly bulged but not red. Nose: Congested. Throat: Symmetric, minimal redness, no white patches  Lungs:  CTA B Normal respiratory effort, no intercostal retractions, no accessory muscle use. Heart: RRR,  no murmur.  Lower extremities: no pretibial edema bilaterally  Skin: Not pale.  Not jaundice Neurologic:  alert & oriented X3.  Speech normal, gait appropriate for age and unassisted Psych--  Cognition and judgment appear intact.  Cooperative with normal attention span and concentration.  Behavior appropriate. No anxious or depressed appearing.      Assessment     Assessment  Diabetes 2012 (Metformin intolerant 10/2019) HTN Dyslipidemia (intol to atorvastatin per patient.  Crestor DC'd 2015 due to cost) asthma Menopause at age 39 Rosacea GI: --RUQ pain: Ultrasound and HIDA scan negative (02-2015, 03-2015) -- 07/2016--  H Pylori  (+) , partially treated biaxin/flagel, poor tolerance  --constipation  Stress test 2012 neg , elevated BP +FH CAD brother MI 25 y/o  PLAN DM: Last A1c was actually worse than previous, 7.8.  Was rec to continue Januvia and increase Farxiga to 10 mg.  Reports good medication compliance, no ambulatory CBGs, patient advised about diet particularly avoid excessive rice, starches.  Check A1c. HTN: No recent ambulatory BPs, Continue Hyzaar, check CMP High cholesterol: Last LDL 133, was rec to switch from Pravachol to Crestor.  Good compliance, no apparent side effects, check FLP. Cough: Possibly a URI, recommend conservative treatment, if no better start doxycycline.  See AVS. Preventive care: Recommend COVID booster in 2 to 3 weeks. RTC 3 to 4 months CPX    This visit occurred during the SARS-CoV-2 public health emergency.  Safety protocols were in place, including screening questions prior to the visit, additional usage of staff PPE, and extensive cleaning of exam room while observing appropriate contact time as indicated for disinfecting solutions.

## 2021-06-14 NOTE — Assessment & Plan Note (Signed)
DM: Last A1c was actually worse than previous, 7.8.  Was rec to continue Januvia and increase Farxiga to 10 mg.  Reports good medication compliance, no ambulatory CBGs, patient advised about diet particularly avoid excessive rice, starches.  Check A1c. HTN: No recent ambulatory BPs, Continue Hyzaar, check CMP High cholesterol: Last LDL 133, was rec to switch from Pravachol to Crestor.  Good compliance, no apparent side effects, check FLP. Cough: Possibly a URI, recommend conservative treatment, if no better start doxycycline.  See AVS. Preventive care: Recommend COVID booster in 2 to 3 weeks. RTC 3 to 4 months CPX

## 2021-06-18 NOTE — Addendum Note (Signed)
Addended byDamita Dunnings D on: 06/18/2021 09:06 AM   Modules accepted: Orders

## 2021-07-12 DIAGNOSIS — J4520 Mild intermittent asthma, uncomplicated: Secondary | ICD-10-CM | POA: Diagnosis not present

## 2021-08-12 DIAGNOSIS — J4520 Mild intermittent asthma, uncomplicated: Secondary | ICD-10-CM | POA: Diagnosis not present

## 2021-08-14 ENCOUNTER — Encounter: Payer: Self-pay | Admitting: Internal Medicine

## 2021-08-15 ENCOUNTER — Other Ambulatory Visit: Payer: Self-pay | Admitting: Internal Medicine

## 2021-10-03 ENCOUNTER — Other Ambulatory Visit: Payer: Self-pay | Admitting: Internal Medicine

## 2021-10-17 ENCOUNTER — Encounter: Payer: BC Managed Care – PPO | Admitting: Internal Medicine

## 2021-10-25 ENCOUNTER — Encounter: Payer: BC Managed Care – PPO | Admitting: Internal Medicine

## 2021-11-01 ENCOUNTER — Encounter: Payer: BC Managed Care – PPO | Admitting: Internal Medicine

## 2021-11-08 ENCOUNTER — Other Ambulatory Visit: Payer: Self-pay | Admitting: Internal Medicine

## 2021-11-15 ENCOUNTER — Encounter: Payer: Self-pay | Admitting: Internal Medicine

## 2021-11-15 ENCOUNTER — Ambulatory Visit (INDEPENDENT_AMBULATORY_CARE_PROVIDER_SITE_OTHER): Payer: BC Managed Care – PPO | Admitting: Internal Medicine

## 2021-11-15 VITALS — BP 126/82 | HR 88 | Temp 97.9°F | Resp 16 | Ht 59.0 in | Wt 152.4 lb

## 2021-11-15 DIAGNOSIS — E119 Type 2 diabetes mellitus without complications: Secondary | ICD-10-CM | POA: Diagnosis not present

## 2021-11-15 DIAGNOSIS — I10 Essential (primary) hypertension: Secondary | ICD-10-CM

## 2021-11-15 DIAGNOSIS — Z0000 Encounter for general adult medical examination without abnormal findings: Secondary | ICD-10-CM

## 2021-11-15 LAB — COMPREHENSIVE METABOLIC PANEL
ALT: 39 U/L — ABNORMAL HIGH (ref 0–35)
AST: 30 U/L (ref 0–37)
Albumin: 4.9 g/dL (ref 3.5–5.2)
Alkaline Phosphatase: 84 U/L (ref 39–117)
BUN: 14 mg/dL (ref 6–23)
CO2: 23 mEq/L (ref 19–32)
Calcium: 10.2 mg/dL (ref 8.4–10.5)
Chloride: 100 mEq/L (ref 96–112)
Creatinine, Ser: 0.78 mg/dL (ref 0.40–1.20)
GFR: 83.53 mL/min (ref >60.00)
Glucose, Bld: 122 mg/dL — ABNORMAL HIGH (ref 70–99)
Potassium: 3.5 mEq/L (ref 3.5–5.1)
Sodium: 135 mEq/L (ref 135–145)
Total Bilirubin: 0.6 mg/dL (ref 0.2–1.2)
Total Protein: 8.6 g/dL — ABNORMAL HIGH (ref 6.0–8.3)

## 2021-11-15 LAB — CBC WITH DIFFERENTIAL/PLATELET
Basophils Absolute: 0 10*3/uL (ref 0.0–0.1)
Basophils Relative: 0.4 % (ref 0.0–3.0)
Eosinophils Absolute: 0.1 10*3/uL (ref 0.0–0.7)
Eosinophils Relative: 0.9 % (ref 0.0–5.0)
HCT: 43.7 % (ref 36.0–46.0)
Hemoglobin: 14.8 g/dL (ref 12.0–15.0)
Lymphocytes Relative: 31.5 % (ref 12.0–46.0)
Lymphs Abs: 2.2 10*3/uL (ref 0.7–4.0)
MCHC: 33.9 g/dL (ref 30.0–36.0)
MCV: 84.5 fl (ref 78.0–100.0)
Monocytes Absolute: 0.6 10*3/uL (ref 0.1–1.0)
Monocytes Relative: 8 % (ref 3.0–12.0)
Neutro Abs: 4.2 10*3/uL (ref 1.4–7.7)
Neutrophils Relative %: 59.2 % (ref 43.0–77.0)
Platelets: 243 10*3/uL (ref 150.0–400.0)
RBC: 5.17 Mil/uL — ABNORMAL HIGH (ref 3.87–5.11)
RDW: 14.2 % (ref 11.5–15.5)
WBC: 7.1 10*3/uL (ref 4.0–10.5)

## 2021-11-15 LAB — HEMOGLOBIN A1C: Hgb A1c MFr Bld: 8 % — ABNORMAL HIGH (ref 4.6–6.5)

## 2021-11-15 LAB — TSH: TSH: 1.77 u[IU]/mL (ref 0.35–5.50)

## 2021-11-15 NOTE — Patient Instructions (Addendum)
Recommend to proceed with covid booster (bivalent) at your pharmacy.  Also considered the Shingrix vaccine  Please return the stool sample   Check the  blood pressure regularly BP GOAL is between 110/65 and  135/85. If it is consistently higher or lower, let me know  Diabetes: Check your blood sugar at different times  - early in AM fasting  ( goal 70-130) - 2 hours after a meal (goal less than 180)    GO TO THE LAB : Get the blood work     [place], [place] back for a checkup in 38-month    "Living will", "HPelhamof attorney": Advanced care planning  (If you already have a living will or healthcare power of attorney, please bring the copy to be scanned in your chart.)  Advance care planning is a process that supports adults in  understanding and sharing their preferences regarding future medical care.   The patient's preferences are recorded in documents called Advance Directives.    Advanced directives are completed (and can be modified at any time) while the patient is in full mental capacity.   The documentation should be available at all times to the patient, the family and the healthcare providers.  Bring in a copy to be scanned in your chart is an excellent idea and is recommended   This legal documents direct treatment decision making and/or appoint a surrogate to make the decision if the patient is not capable to do so.    Advance directives can be documented in many types of formats,  documents have names such as:  Lliving will  Durable power of attorney for healthcare (healthcare proxy or healthcare power of attorney)  Combined directives  Physician orders for life-sustaining treatment    More information at:  [email or web address]

## 2021-11-15 NOTE — Progress Notes (Unsigned)
Subjective:    Patient ID: Barbara Flores, female    DOB: April 20, 1961, 61 y.o.   MRN: [id number]  DOS:  11/15/2021 Type of visit - description: cpx  Here for CPX. No major concerns. No recent ambulatory CBGs. Ambulatory BPs normal.   Review of Systems See above   Past Medical History:  Diagnosis Date   Allergy    Asthma    Diabetes mellitus 08/23/2010   GERD (gastroesophageal reflux disease)    History of exercise stress test 07-2010   normal except for elevated BP   Hyperlipidemia    Hypertension    Menopause    Rosacea     Past Surgical History:  Procedure Laterality Date   Eardrum surgery     HEMORRHOID SURGERY     NASAL SINUS SURGERY     OTHER SURGICAL HISTORY     female surgery after miscarriage, ? D&C or edometrial surgery    Current Outpatient Medications  Medication Instructions   albuterol (PROVENTIL) 2.5 mg, Nebulization, Every 6 hours PRN   albuterol (VENTOLIN HFA) 108 (90 Base) MCG/ACT inhaler INHALE 2 PUFFS BY MOUTH EVERY 6 HOURS AS NEEDED FOR WHEEZING OR SHORTNESS OF BREATH   aspirin 81 mg, Oral, Daily,     azelastine (ASTELIN) 0.1 % nasal spray 2 sprays, Each Nare, At bedtime PRN, Use in each nostril as directed   dapagliflozin propanediol (FARXIGA) 10 mg, Oral, Daily before breakfast   doxycycline (VIBRA-TABS) 100 mg, Oral, 2 times daily   FINACEA 15 % cream No dose, route, or frequency recorded.   fluticasone (FLONASE) 50 MCG/ACT nasal spray 2 sprays, Each Nare, Daily   Fluticasone Propionate, Inhal, (FLOVENT DISKUS) 100 MCG/BLIST AEPB Take 2 puffs twice daily during flares. Rinse your mouth out after use.   ibuprofen (ADVIL) 600 mg, Oral, Every 8 hours PRN   JANUVIA 100 MG tablet Take 1 tablet by mouth once daily   levocetirizine (XYZAL) 5 mg, Oral, Every evening   losartan-hydrochlorothiazide (HYZAAR) 100-25 MG tablet Take 1 tablet by mouth once daily   rosuvastatin (CRESTOR) 20 MG tablet TAKE 1 TABLET BY MOUTH AT BEDTIME       Objective:    Physical Exam BP 126/82   Pulse 88   Temp 97.9 F (36.6 C) (Oral)   Resp 16   Ht '4\' 11"'$  (1.499 m)   Wt 152 lb 6 oz (69.1 kg)   SpO2 97%   BMI 30.78 kg/m  General: Well developed, NAD, BMI noted Neck: No  thyromegaly  HEENT:  Normocephalic . Face symmetric, atraumatic Lungs:  CTA B Normal respiratory effort, no intercostal retractions, no accessory muscle use. Heart: RRR,  no murmur.  Abdomen:  Not distended, soft, non-tender. No rebound or rigidity.   DM foot exam: No edema, good pedal pulses, pinprick examination normal Skin: Exposed areas without rash. Not pale. Not jaundice Neurologic:  alert & oriented X3.  Speech normal, gait appropriate for age and unassisted Strength symmetric and appropriate for age.  Psych: Cognition and judgment appear intact.  Cooperative with normal attention span and concentration.  Behavior appropriate. No anxious or depressed appearing.     Assessment     Assessment  Diabetes 2012 (Metformin intolerant 10/2019) HTN Dyslipidemia (intol to atorvastatin per patient.  Crestor DC'd 2015 due to cost) asthma Menopause at age 47 Rosacea GI: --RUQ pain: Ultrasound and HIDA scan negative (02-2015, 03-2015) -- 07/2016--  H Pylori  (+) , partially treated biaxin/flagel, poor tolerance  --constipation  Stress test 2012 neg ,  elevated BP +FH CAD brother MI 87 y/o  PLAN Here for CPX DM: Poorly controlled, last A1c 9.2, admits to poor diet however in the last few months she is starting to improve. Feet exam negative High cholesterol: Well-controlled.  Recheck on RTC History of intolerance to metformin,Currently on Farxiga and Januvia. We discussed the need to continue improving her diet.  We will see endocrinology next month. Check A1c and TSH in preparation of that visit. Asthma: Well-controlled, uses inhalers as needed only. RTC 6 months  -Td 2022 - pnm shot 2015, prevnar 2016 -shingrix: Is an option -covid vax: Booster is an option -  Hepatitis immunization done recently @ [facility], Hep A? Hep B? -CCS:  Colonoscopy 07-2015 (-), next 10 years . iFOB in 5 years?:    IFOB evaluated today. -Female care per gynecologist    sees gyn regulalrly, had a PAP per pt, had a MMG 01-2021 ([facility])  -Patient education: Diet exercise -Bone health: Menopause at age 89,  RX DEXA 5 years after menopause. - Labs: CMP, CBC, A1c, TSH - ACP info provided     DM: Last A1c was actually worse than previous, 7.8.  Was rec to continue Januvia and increase Farxiga to 10 mg.  Reports good medication compliance, no ambulatory CBGs, patient advised about diet particularly avoid excessive rice, starches.  Check A1c. HTN: No recent ambulatory BPs, Continue Hyzaar, check CMP High cholesterol: Last LDL 133, was rec to switch from Pravachol to Crestor.  Good compliance, no apparent side effects, check FLP. Cough: Possibly a URI, recommend conservative treatment, if no better start doxycycline.  See AVS. Preventive care: Recommend COVID booster in 2 to 3 weeks. RTC 3 to 4 months CPX

## 2021-11-16 ENCOUNTER — Encounter: Payer: Self-pay | Admitting: Internal Medicine

## 2021-11-16 NOTE — Assessment & Plan Note (Signed)
-  Td 2022 - pnm shot 2015, prevnar 2016 -shingrix: Is an option -covid vax: Booster is an option - Hepatitis immunization done recently @ [facility] per pt :  Hep A? Hep B? -CCS:  Colonoscopy 07-2015 (-), next 10 years but doing iFOBs since 2022; IFOB kit provided  today. -Female care per gynecologist    sees gyn regulalrly, had a PAP per pt, had a MMG 01-2021 ([facility])  -Patient education: Diet exercise -Bone health: Menopause at age 61,  RX DEXA 5 years after menopause. - Labs: CMP, CBC, A1c, TSH - ACP info provided

## 2021-11-16 NOTE — Assessment & Plan Note (Signed)
Here for CPX DM: Poorly controlled, last A1c 9.2, admits to poor diet however in the last few months, she is starting to improve. Feet exam negative. History of intolerance to metformin,Currently on Farxiga and Januvia. We discussed the need to continue improving her diet.  We will see endo  next month. Check A1c and TSH in preparation of that visit. High cholesterol: Well-controlled.  Recheck on RTC Asthma: Well-controlled, uses inhalers as needed only. RTC 6 months

## 2021-11-21 DIAGNOSIS — Z1231 Encounter for screening mammogram for malignant neoplasm of breast: Secondary | ICD-10-CM | POA: Diagnosis not present

## 2021-11-21 LAB — HM MAMMOGRAPHY

## 2021-11-22 ENCOUNTER — Encounter: Payer: Self-pay | Admitting: Internal Medicine

## 2021-11-26 ENCOUNTER — Encounter: Payer: Self-pay | Admitting: Internal Medicine

## 2021-12-18 ENCOUNTER — Other Ambulatory Visit (INDEPENDENT_AMBULATORY_CARE_PROVIDER_SITE_OTHER): Payer: BC Managed Care – PPO

## 2021-12-18 DIAGNOSIS — Z0000 Encounter for general adult medical examination without abnormal findings: Secondary | ICD-10-CM

## 2021-12-19 ENCOUNTER — Telehealth: Payer: Self-pay | Admitting: *Deleted

## 2021-12-19 DIAGNOSIS — R195 Other fecal abnormalities: Secondary | ICD-10-CM

## 2021-12-19 LAB — FECAL OCCULT BLOOD, IMMUNOCHEMICAL: Fecal Occult Bld: POSITIVE — AB

## 2021-12-19 NOTE — Telephone Encounter (Signed)
Received call from [place] at [facility] lab reporting positive iFOB.

## 2021-12-19 NOTE — Telephone Encounter (Signed)
Please call patient, advised that the stool test came back positive. Next next step to evaluate her colon is a colonoscopy, arrange a GI referral.

## 2021-12-20 NOTE — Telephone Encounter (Signed)
Spoke w/ Pt- made her aware of results and GI referral. Pt verbalized understanding.

## 2021-12-23 ENCOUNTER — Other Ambulatory Visit: Payer: Self-pay | Admitting: Internal Medicine

## 2021-12-24 ENCOUNTER — Encounter: Payer: Self-pay | Admitting: Gastroenterology

## 2022-01-02 ENCOUNTER — Ambulatory Visit: Payer: Self-pay

## 2022-01-02 ENCOUNTER — Ambulatory Visit (INDEPENDENT_AMBULATORY_CARE_PROVIDER_SITE_OTHER): Payer: BC Managed Care – PPO | Admitting: Family Medicine

## 2022-01-02 ENCOUNTER — Encounter: Payer: Self-pay | Admitting: Family Medicine

## 2022-01-02 VITALS — BP 148/78 | Ht 59.0 in | Wt 152.0 lb

## 2022-01-02 DIAGNOSIS — M7711 Lateral epicondylitis, right elbow: Secondary | ICD-10-CM | POA: Insufficient documentation

## 2022-01-02 MED ORDER — METHYLPREDNISOLONE ACETATE 40 MG/ML IJ SUSP
40.0000 mg | Freq: Once | INTRAMUSCULAR | Status: AC
  Administered 2022-01-02: 40 mg via INTRA_ARTICULAR

## 2022-01-02 NOTE — Patient Instructions (Signed)
Good to see you Please use ice as needed  Please try the exercises   Please send me a message in MyChart with any questions or updates.  Please see me back in 4 weeks or as needed if better.   --Dr. Raeford Razor

## 2022-01-02 NOTE — Progress Notes (Signed)
  Barbara Flores - 61 y.o. female MRN [id number]  Date of birth: [birth year]  SUBJECTIVE:  Including CC & ROS.  No chief complaint on file.   Barbara Flores is a 61 y.o. female that is presenting with acute right elbow pain.  The pain is occurring over the lateral aspect.  It is having some radiation distally.  No injury.   Review of Systems See HPI   HISTORY: Past Medical, Surgical, Social, and Family History Reviewed & Updated per EMR.   Pertinent Historical Findings include:  Past Medical History:  Diagnosis Date   Allergy    Asthma    Diabetes mellitus 08/23/2010   GERD (gastroesophageal reflux disease)    History of exercise stress test 07-2010   normal except for elevated BP   Hyperlipidemia    Hypertension    Menopause    Rosacea     Past Surgical History:  Procedure Laterality Date   Eardrum surgery     HEMORRHOID SURGERY     NASAL SINUS SURGERY     OTHER SURGICAL HISTORY     female surgery after miscarriage, ? D&C or edometrial surgery     PHYSICAL EXAM:  VS: BP (!) 148/78 (BP Location: Left Arm, Patient Position: Sitting)   Ht '4\' 11"'$  (1.499 m)   Wt 152 lb (68.9 kg)   BMI 30.70 kg/m  Physical Exam Gen: NAD, alert, cooperative with exam, well-appearing MSK:  Neurovascularly intact    Limited ultrasound: Right elbow:  Calcification appreciated at the origin of the common extensor. No joint effusion  Summary: Findings consistent with lateral epicondylitis  Ultrasound and interpretation by Clearance Coots, MD   Aspiration/Injection Procedure Note Barbara Flores 06/09/1961  Procedure: Injection Indications: Right elbow pain  Procedure Details Consent: Risks of procedure as well as the alternatives and risks of each were explained to the (patient/caregiver).  Consent for procedure obtained. Time Out: Verified patient identification, verified procedure, site/side was marked, verified correct patient position, special equipment/implants available,  medications/allergies/relevent history reviewed, required imaging and test results available.  Performed.  The area was cleaned with iodine and alcohol swabs.    The right lateral epicondyle was injected using 1 cc of 1% lidocaine,1 cc's of 40 mg Depo-Medrol and 1 cc's of 0.25% bupivacaine with a 25 1 1/2" needle.  Ultrasound was used. Images were obtained in long views showing the injection.     A sterile dressing was applied.  Patient did tolerate procedure well.   ASSESSMENT & PLAN:   Lateral epicondylitis of right elbow Acutely occurring.  Symptoms most consistent with tennis elbow. -Counseled on home exercise therapy and supportive care. -Injection today. -Could consider shockwave therapy or physical therapy

## 2022-01-02 NOTE — Assessment & Plan Note (Signed)
Acutely occurring.  Symptoms most consistent with tennis elbow. -Counseled on home exercise therapy and supportive care. -Injection today. -Could consider shockwave therapy or physical therapy

## 2022-01-10 ENCOUNTER — Ambulatory Visit: Payer: BC Managed Care – PPO | Admitting: Internal Medicine

## 2022-01-27 ENCOUNTER — Telehealth: Payer: Self-pay | Admitting: Family Medicine

## 2022-01-27 NOTE — Telephone Encounter (Signed)
Patient cld states still having significant elbow pain req   patch for the pain. --- Forwarding request to med asst for review w/provider. --Also scheduled appt 8/16  -glh

## 2022-01-28 NOTE — Telephone Encounter (Signed)
Answered her questions.   Rosemarie Ax, MD [facility] 01/28/2022, 1:06 PM

## 2022-01-29 ENCOUNTER — Ambulatory Visit (INDEPENDENT_AMBULATORY_CARE_PROVIDER_SITE_OTHER): Payer: BC Managed Care – PPO | Admitting: Family Medicine

## 2022-01-29 ENCOUNTER — Other Ambulatory Visit: Payer: Self-pay | Admitting: Internal Medicine

## 2022-01-29 ENCOUNTER — Encounter: Payer: Self-pay | Admitting: Family Medicine

## 2022-01-29 VITALS — BP 130/80 | Ht 59.0 in | Wt 152.0 lb

## 2022-01-29 DIAGNOSIS — M7711 Lateral epicondylitis, right elbow: Secondary | ICD-10-CM | POA: Diagnosis not present

## 2022-01-29 MED ORDER — MELOXICAM 15 MG PO TABS: 15.0000 mg | ORAL_TABLET | Freq: Every day | ORAL | 1 refills | Status: DC | PRN

## 2022-01-29 MED ORDER — NITROGLYCERIN 0.2 MG/HR TD PT24: MEDICATED_PATCH | TRANSDERMAL | 11 refills | Status: DC

## 2022-01-29 NOTE — Patient Instructions (Signed)
Good to see you Please use ice as needed  Please try nitro patches   Please send me a message in MyChart with any questions or updates.  Please see me back to perform shockwave therapy .   --Dr. Raeford Razor  Nitroglycerin Protocol  Apply 1/4 nitroglycerin patch to affected area daily. Change position of patch within the affected area every 24 hours. You may experience a headache during the first 1-2 weeks of using the patch, these should subside. If you experience headaches after beginning nitroglycerin patch treatment, you may take your preferred over the counter pain reliever. Another side effect of the nitroglycerin patch is skin irritation or rash related to patch adhesive. Please notify our office if you develop more severe headaches or rash, and stop the patch. Tendon healing with nitroglycerin patch may require 12 to 24 weeks depending on the extent of injury. Men should not use if taking Viagra, Cialis, or Levitra.  Do not use if you have migraines or rosacea.

## 2022-01-29 NOTE — Progress Notes (Signed)
  Nyashia Raney - 61 y.o. female MRN [id number]  Date of birth: 1961/01/10  SUBJECTIVE:  Including CC & ROS.  No chief complaint on file.   Masyn Fullam is a 61 y.o. female that is presenting with worsening of her right elbow pain.  She did get minor relief with the steroid injection but the pain is returning.   Review of Systems See HPI   HISTORY: Past Medical, Surgical, Social, and Family History Reviewed & Updated per EMR.   Pertinent Historical Findings include:  Past Medical History:  Diagnosis Date   Allergy    Asthma    Diabetes mellitus 08/23/2010   GERD (gastroesophageal reflux disease)    History of exercise stress test 07-2010   normal except for elevated BP   Hyperlipidemia    Hypertension    Menopause    Rosacea     Past Surgical History:  Procedure Laterality Date   Eardrum surgery     HEMORRHOID SURGERY     NASAL SINUS SURGERY     OTHER SURGICAL HISTORY     female surgery after miscarriage, ? D&C or edometrial surgery     PHYSICAL EXAM:  VS: BP 130/80 (BP Location: Left Arm, Patient Position: Sitting)   Ht '4\' 11"'$  (1.499 m)   Wt 152 lb (68.9 kg)   BMI 30.70 kg/m  Physical Exam Gen: NAD, alert, cooperative with exam, well-appearing MSK:  Neurovascularly intact       ASSESSMENT & PLAN:   Lateral epicondylitis of right elbow Acutely worsening since the injection.  Pain is intermittent in nature. -Counseled on home exercise therapy and supportive care. -Nitro patches. -Meloxicam. -Can pursue shockwave therapy

## 2022-01-29 NOTE — Assessment & Plan Note (Signed)
Acutely worsening since the injection.  Pain is intermittent in nature. -Counseled on home exercise therapy and supportive care. -Nitro patches. -Meloxicam. -Can pursue shockwave therapy

## 2022-01-30 ENCOUNTER — Encounter: Payer: Self-pay | Admitting: Family Medicine

## 2022-01-30 ENCOUNTER — Ambulatory Visit (INDEPENDENT_AMBULATORY_CARE_PROVIDER_SITE_OTHER): Payer: Self-pay | Admitting: Family Medicine

## 2022-01-30 DIAGNOSIS — M7711 Lateral epicondylitis, right elbow: Secondary | ICD-10-CM

## 2022-01-30 NOTE — Assessment & Plan Note (Signed)
Completed shockwave therapy  

## 2022-01-30 NOTE — Progress Notes (Signed)
  Barbara Flores - 61 y.o. female MRN [id number]  Date of birth: 1960/10/17  SUBJECTIVE:  Including CC & ROS.  No chief complaint on file.   Barbara Flores is a 61 y.o. female that is here for shockwave therapy.    Review of Systems See HPI   HISTORY: Past Medical, Surgical, Social, and Family History Reviewed & Updated per EMR.   Pertinent Historical Findings include:  Past Medical History:  Diagnosis Date   Allergy    Asthma    Diabetes mellitus 08/23/2010   GERD (gastroesophageal reflux disease)    History of exercise stress test 07-2010   normal except for elevated BP   Hyperlipidemia    Hypertension    Menopause    Rosacea     Past Surgical History:  Procedure Laterality Date   Eardrum surgery     HEMORRHOID SURGERY     NASAL SINUS SURGERY     OTHER SURGICAL HISTORY     female surgery after miscarriage, ? D&C or edometrial surgery     PHYSICAL EXAM:  VS: Ht '4\' 11"'$  (1.499 m)   Wt 152 lb (68.9 kg)   BMI 30.70 kg/m  Physical Exam Gen: NAD, alert, cooperative with exam, well-appearing MSK:  Neurovascularly intact    ECSWT Note Barbara Flores 23-Jan-1961  Procedure: ECSWT Indications: right elbow pain  Procedure Details Consent: Risks of procedure as well as the alternatives and risks of each were explained to the (patient/caregiver).  Consent for procedure obtained. Time Out: Verified patient identification, verified procedure, site/side was marked, verified correct patient position, special equipment/implants available, medications/allergies/relevent history reviewed, required imaging and test results available.  Performed.  The area was cleaned with iodine and alcohol swabs.    The right elbow was targeted for Extracorporeal shockwave therapy.   Preset: Tennis elbow Power Level: 40 Frequency: 10 Impulse/cycles: 2000 Head size: small  Session: 1  Patient did tolerate procedure well.    ASSESSMENT & PLAN:   Lateral epicondylitis of right  elbow Completed shockwave therapy

## 2022-02-10 ENCOUNTER — Other Ambulatory Visit: Payer: Self-pay | Admitting: Family Medicine

## 2022-02-10 MED ORDER — NITROGLYCERIN 0.2 MG/HR TD PT24
MEDICATED_PATCH | TRANSDERMAL | 11 refills | Status: DC
Start: 2022-02-10 — End: 2024-03-14

## 2022-02-16 ENCOUNTER — Other Ambulatory Visit: Payer: Self-pay | Admitting: Internal Medicine

## 2022-02-21 ENCOUNTER — Ambulatory Visit: Payer: BC Managed Care – PPO

## 2022-02-21 ENCOUNTER — Ambulatory Visit (INDEPENDENT_AMBULATORY_CARE_PROVIDER_SITE_OTHER): Payer: Self-pay | Admitting: Family Medicine

## 2022-02-21 ENCOUNTER — Encounter: Payer: Self-pay | Admitting: Family Medicine

## 2022-02-21 DIAGNOSIS — M7711 Lateral epicondylitis, right elbow: Secondary | ICD-10-CM

## 2022-02-21 NOTE — Assessment & Plan Note (Signed)
Completed shockwave therapy  

## 2022-02-21 NOTE — Progress Notes (Signed)
  Barbara Flores - 61 y.o. female MRN [id number]  Date of birth: 1960/07/24  SUBJECTIVE:  Including CC & ROS.  No chief complaint on file.   Barbara Flores is a 61 y.o. female that is  here for shockwave therapy.    Review of Systems See HPI   HISTORY: Past Medical, Surgical, Social, and Family History Reviewed & Updated per EMR.   Pertinent Historical Findings include:  Past Medical History:  Diagnosis Date   Allergy    Asthma    Diabetes mellitus 08/23/2010   GERD (gastroesophageal reflux disease)    History of exercise stress test 07-2010   normal except for elevated BP   Hyperlipidemia    Hypertension    Menopause    Rosacea     Past Surgical History:  Procedure Laterality Date   Eardrum surgery     HEMORRHOID SURGERY     NASAL SINUS SURGERY     OTHER SURGICAL HISTORY     female surgery after miscarriage, ? D&C or edometrial surgery     PHYSICAL EXAM:  VS: Ht '4\' 11"'$  (1.499 m)   Wt 152 lb (68.9 kg)   BMI 30.70 kg/m  Physical Exam Gen: NAD, alert, cooperative with exam, well-appearing MSK:  Neurovascularly intact    ECSWT Note Barbara Flores July 11, 1960  Procedure: ECSWT Indications: right elbow pain  Procedure Details Consent: Risks of procedure as well as the alternatives and risks of each were explained to the (patient/caregiver).  Consent for procedure obtained. Time Out: Verified patient identification, verified procedure, site/side was marked, verified correct patient position, special equipment/implants available, medications/allergies/relevent history reviewed, required imaging and test results available.  Performed.  The area was cleaned with iodine and alcohol swabs.    The right elbow was targeted for Extracorporeal shockwave therapy.   Preset: lateral epicondylitis  Power Level: 60 Frequency: 10 Impulse/cycles: 2200 Head size: small  Session: 2  Patient did tolerate procedure well.    ASSESSMENT & PLAN:   Lateral epicondylitis of right  elbow Completed shockwave therapy

## 2022-03-04 ENCOUNTER — Ambulatory Visit: Payer: BC Managed Care – PPO | Admitting: Gastroenterology

## 2022-03-22 ENCOUNTER — Other Ambulatory Visit: Payer: Self-pay | Admitting: Family

## 2022-03-27 ENCOUNTER — Telehealth: Payer: Self-pay | Admitting: Internal Medicine

## 2022-03-28 MED ORDER — DAPAGLIFLOZIN PROPANEDIOL 10 MG PO TABS: 10.0000 mg | ORAL_TABLET | Freq: Every day | ORAL | 0 refills | Status: DC

## 2022-03-31 NOTE — Telephone Encounter (Signed)
Patient states her insurance will not cover [place]. Please advise.

## 2022-03-31 NOTE — Telephone Encounter (Signed)
PA initiated via Covermymeds; KEY: [id number]. Awaiting determination.

## 2022-04-01 NOTE — Telephone Encounter (Signed)
Per plan- PA is not required. Paid claim on file from 03/29/22.

## 2022-04-02 MED ORDER — EMPAGLIFLOZIN 25 MG PO TABS
25.0000 mg | ORAL_TABLET | Freq: Every day | ORAL | 3 refills | Status: DC
Start: 2022-04-02 — End: 2022-04-09

## 2022-04-02 NOTE — Telephone Encounter (Signed)
Rx sent. Mychart message sent to Pt.  

## 2022-04-02 NOTE — Telephone Encounter (Signed)
Pt states pharmacy advised OOP cost for farxiga would be $500 and she would like to see what alternatives there are.

## 2022-04-02 NOTE — Telephone Encounter (Signed)
Send Jardiance 25 mg 1 p.o. daily. If this medication is not covered or expensive we will have to use generic

## 2022-04-02 NOTE — Telephone Encounter (Signed)
Please advise 

## 2022-04-02 NOTE — Addendum Note (Signed)
Addended byDamita Dunnings D on: 04/02/2022 12:59 PM   Modules accepted: Orders

## 2022-04-05 ENCOUNTER — Other Ambulatory Visit: Payer: Self-pay | Admitting: Internal Medicine

## 2022-04-07 NOTE — Telephone Encounter (Signed)
Provide a cupon. If unable to get medication even with a coupon we may need to change to a different class of drugs.

## 2022-04-07 NOTE — Telephone Encounter (Signed)
LMOM informing Pt that coupon for Jardiance has been placed at front desk for pick up at her convenience.

## 2022-04-07 NOTE — Telephone Encounter (Signed)
Barbara Flores is also to expensive. Please advise.

## 2022-04-07 NOTE — Telephone Encounter (Signed)
Pt stated farxiga and jardiance are too expensive OOP. She would like to know if we could offer any type of coupon.

## 2022-04-09 MED ORDER — PIOGLITAZONE HCL 30 MG PO TABS: 30.0000 mg | ORAL_TABLET | Freq: Every day | ORAL | 0 refills | Status: DC

## 2022-04-09 NOTE — Telephone Encounter (Signed)
Cost still an issue w/ Jardiance coupon.

## 2022-04-09 NOTE — Telephone Encounter (Signed)
Pt stated with coupon rx is still to expensive.

## 2022-04-09 NOTE — Telephone Encounter (Signed)
Okay, send Actos 30 mg 1 tablet daily. 32-monthsupply. Watch for side effects (fluid retention, edema).

## 2022-04-09 NOTE — Telephone Encounter (Signed)
Rx sent. Mychart message sent to Pt.  

## 2022-04-09 NOTE — Addendum Note (Signed)
Addended byDamita Dunnings D on: 04/09/2022 03:52 PM   Modules accepted: Orders

## 2022-04-25 ENCOUNTER — Encounter: Payer: Self-pay | Admitting: Internal Medicine

## 2022-04-25 ENCOUNTER — Ambulatory Visit (INDEPENDENT_AMBULATORY_CARE_PROVIDER_SITE_OTHER): Payer: BC Managed Care – PPO | Admitting: Internal Medicine

## 2022-04-25 VITALS — BP 140/90 | HR 92 | Ht 59.0 in | Wt 155.0 lb

## 2022-04-25 DIAGNOSIS — E1165 Type 2 diabetes mellitus with hyperglycemia: Secondary | ICD-10-CM

## 2022-04-25 LAB — POCT GLYCOSYLATED HEMOGLOBIN (HGB A1C): Hemoglobin A1C: 9.4 % — AB (ref 4.0–5.6)

## 2022-04-25 LAB — POCT GLUCOSE (DEVICE FOR HOME USE): POC Glucose: 176 mg/dl — AB (ref 70–99)

## 2022-04-25 MED ORDER — GLIPIZIDE 5 MG PO TABS: 5.0000 mg | ORAL_TABLET | Freq: Every day | ORAL | 1 refills | Status: DC

## 2022-04-25 MED ORDER — PIOGLITAZONE HCL 30 MG PO TABS: 30.0000 mg | ORAL_TABLET | Freq: Every day | ORAL | 1 refills | Status: DC

## 2022-04-25 MED ORDER — RYBELSUS 7 MG PO TABS: 7.0000 mg | ORAL_TABLET | Freq: Every day | ORAL | 3 refills | Status: DC

## 2022-04-25 NOTE — Progress Notes (Signed)
Name: Danelly Hassinger  MRN/ DOB: [id number], 1961/02/02   Age/ Sex: 61 y.o., female    PCP: Colon Branch, MD   Reason for Endocrinology Evaluation: Type 2 Diabetes Mellitus     Date of Initial Endocrinology Visit: 04/25/2022     PATIENT IDENTIFIER: Ms. Nimra Puccinelli is a 61 y.o. female with a past medical history of HTN, DM and dyslipidemia. The patient presented for initial endocrinology clinic visit on 04/25/2022 for consultative assistance with her diabetes management.    HPI: Ms. Vosler was    Diagnosed with DM 2018 Prior Medications tried/Intolerance: Metformin - GI side effects  Currently checking blood sugars 0 x / day Hypoglycemia episodes : no               Hemoglobin A1c has ranged from 6.5% in 2021, peaking at 9.2% in 2022. Patient has required hospitalization within the last 1 year from hyper or hypoglycemia: no  In terms of diet, the patient eats 3 meals a day . Avoids sugar sweetened beverages   Jardiance/Farxiga  was $500 , has new insurance   No pancreatitis     HOME DIABETES REGIMEN: Actos 30 mg daily Januvia 100 mg daily     Statin: Yes ACE-I/ARB: Yes Prior Diabetic Education: no   METER DOWNLOAD SUMMARY: Did not bring   DIABETIC COMPLICATIONS: Microvascular complications:   Denies: CKD,  Last eye exam: Completed 02/2021  Macrovascular complications:   Denies: CAD, PVD, CVA   PAST HISTORY: Past Medical History:  Past Medical History:  Diagnosis Date   Allergy    Asthma    Diabetes mellitus 08/23/2010   GERD (gastroesophageal reflux disease)    History of exercise stress test 07-2010   normal except for elevated BP   Hyperlipidemia    Hypertension    Menopause    Rosacea    Past Surgical History:  Past Surgical History:  Procedure Laterality Date   Eardrum surgery     HEMORRHOID SURGERY     NASAL SINUS SURGERY     OTHER SURGICAL HISTORY     female surgery after miscarriage, ? D&C or edometrial surgery    Social History:   reports that she has never smoked. She has never used smokeless tobacco. She reports current alcohol use. She reports that she does not use drugs. Family History:  Family History  Problem Relation Age of Onset   Diabetes Mother    Hyperlipidemia Mother    Hypertension Mother    Stroke Mother 20   Kidney failure Mother    Stomach cancer Father        deceased   Breast cancer Sister 47   Heart attack Brother 31   Colon cancer Neg Hx    Esophageal cancer Neg Hx    Rectal cancer Neg Hx      HOME MEDICATIONS: Allergies as of 04/25/2022       Reactions   Crab [shellfish Allergy]    Hydrocodone Nausea Only   Penicillins    vomitting   Amoxicillin Rash        Medication List        Accurate as of April 25, 2022 11:38 AM. If you have any questions, ask your nurse or doctor.          albuterol (2.5 MG/3ML) 0.083% nebulizer solution Commonly known as: PROVENTIL Take 3 mLs (2.5 mg total) by nebulization every 6 (six) hours as needed for wheezing or shortness of breath.   albuterol 108 (90 Base) MCG/ACT  inhaler Commonly known as: VENTOLIN HFA INHALE 2 PUFFS BY MOUTH EVERY 6 HOURS AS NEEDED FOR WHEEZING OR SHORTNESS OF BREATH   aspirin 81 MG tablet Take 81 mg by mouth daily.   azelastine 0.1 % nasal spray Commonly known as: ASTELIN Place 2 sprays into both nostrils at bedtime as needed for rhinitis. Use in each nostril as directed   Finacea 15 % gel Generic drug: Azelaic Acid   Flovent Diskus 100 MCG/ACT Aepb Generic drug: Fluticasone Propionate (Inhal) Take 2 puffs twice daily during flares. Rinse your mouth out after use.   fluticasone 50 MCG/ACT nasal spray Commonly known as: FLONASE Place 2 sprays into both nostrils daily.   Januvia 100 MG tablet Generic drug: sitaGLIPtin Take 1 tablet by mouth once daily   levocetirizine 5 MG tablet Commonly known as: XYZAL Take 1 tablet (5 mg total) by mouth every evening.   losartan-hydrochlorothiazide 100-25 MG  tablet Commonly known as: HYZAAR Take 1 tablet by mouth daily.   meloxicam 15 MG tablet Commonly known as: MOBIC Take 1 tablet (15 mg total) by mouth daily as needed.   nitroGLYCERIN 0.2 mg/hr patch Commonly known as: NITRODUR - Dosed in mg/24 hr Cut and apply 1/4 patch to most painful area q24h.   pioglitazone 30 MG tablet Commonly known as: Actos Take 1 tablet (30 mg total) by mouth daily.   rosuvastatin 20 MG tablet Commonly known as: CRESTOR TAKE 1 TABLET BY MOUTH AT BEDTIME         ALLERGIES: Allergies  Allergen Reactions   Crab [Shellfish Allergy]    Hydrocodone Nausea Only   Penicillins     vomitting   Amoxicillin Rash     REVIEW OF SYSTEMS: A comprehensive ROS was conducted with the patient and is negative except as per HPI and below:  Review of Systems  Gastrointestinal:  Negative for diarrhea and nausea.  Neurological:  Negative for tingling.      OBJECTIVE:   VITAL SIGNS: BP (!) 140/90 (BP Location: Left Arm, Patient Position: Sitting, Cuff Size: Large)   Pulse 92   Ht '4\' 11"'$  (1.499 m)   Wt 155 lb (70.3 kg)   SpO2 99%   BMI 31.31 kg/m    PHYSICAL EXAM:  General: Pt appears well and is in NAD  Neck: General: Supple without adenopathy or carotid bruits. Thyroid: Thyroid size normal.  No goiter or nodules appreciated.   Lungs: Clear with good BS bilat with no rales, rhonchi, or wheezes  Heart: RRR   Abdomen:  soft, nontender  Extremities:  Lower extremities - No pretibial edema. No lesions.  Neuro: MS is good with appropriate affect, pt is alert and Ox3    DM foot exam: 04/25/2022  The skin of the feet is intact without sores or ulcerations. The pedal pulses are 2+ on right and 2+ on left. The sensation is intact to a screening 5.07, 10 gram monofilament bilaterally   DATA REVIEWED:  Lab Results  Component Value Date   HGBA1C 9.4 (A) 04/25/2022   HGBA1C 8.0 (H) 11/15/2021   HGBA1C 9.2 (H) 06/14/2021    Latest Reference Range &  Units 11/15/21 10:41  Sodium 135 - 145 mEq/L 135  Potassium 3.5 - 5.1 mEq/L 3.5  Chloride 96 - 112 mEq/L 100  CO2 19 - 32 mEq/L 23  Glucose 70 - 99 mg/dL 122 (H)  BUN 6 - 23 mg/dL 14  Creatinine 0.40 - 1.20 mg/dL 0.78  Calcium 8.4 - 10.5 mg/dL 10.2  Alkaline  Phosphatase 39 - 117 U/L 84  Albumin 3.5 - 5.2 g/dL 4.9  AST 0 - 37 U/L 30  ALT 0 - 35 U/L 39 (H)  Total Protein 6.0 - 8.3 g/dL 8.6 (H)  Total Bilirubin 0.2 - 1.2 mg/dL 0.6  GFR >60.00 mL/min 83.53   In office BG 176 mg/dL   ASSESSMENT / PLAN / RECOMMENDATIONS:   1) Type 2 Diabetes Mellitus, Poorly controlled, Without complications - Most recent A1c of 9.4 %. Goal A1c < 7.0 %.    Plan: GENERAL: I have discussed with the patient the pathophysiology of diabetes. We went over the natural progression of the disease. I explained the complications associated with diabetes including retinopathy, nephropathy, neuropathy as well as increased risk of cardiovascular disease. We went over the benefit seen with glycemic control.  I explained to the patient that diabetic patients are at higher than normal risk for amputations.  Emphasized the importance of checking glucose at least 2-3 times weekly Discussed limiting CHO intakes.  And avoiding snacks and sugar sweetened beverages.  Her husband has an appointment with the dietitian next week and she will accompany him SGLT2 inhibitors have been cost prohibitive, unfortunately her insurance seems to pay only 50% of the total cost of T3 medications She has been noted with lower extremity swelling, most likely this is due to pioglitazone, exam today shows no evidence of edema, we will continue to monitor.  This time I would not change the dose I have recommended starting glipizide before breakfast I have also recommended switching Januvia to Rybelsus if covered with a coupon that was provided today, I did explain the patient that she cannot be on Januvia and Rybelsus at the same time.  If she is  able to get the Rybelsus then she would not be on Januvia again, but if the coupon is not accepted by her insurance then she will stay on the Januvia She was given a 30-day supply of Rybelsus 3 mg today  MEDICATIONS: Start glipizide 5 mg daily Continue pioglitazone 30 mg daily We will attempt switching Januvia to Rybelsus 3 mg daily as above  EDUCATION / INSTRUCTIONS: BG monitoring instructions: Patient is instructed to check her blood sugars 2-3 times a week. Call [facility] if: BG persistently < 70  I reviewed the Rule of 15 for the treatment of hypoglycemia in detail with the patient. Literature supplied.   2) Diabetic complications:  Eye: Does not have known diabetic retinopathy.  Neuro/ Feet: Does not have known diabetic peripheral neuropathy. Renal: Patient does not have known baseline CKD.   Follow-up in 4 months  Signed electronically by: Mack Guise, MD  [facility]  [facility] [address] [place], [place] [address] Phone: [phone] FAX: [phone]   CC: Colon Branch, [address] [place] [address] Phone: [phone]  Fax: [phone]    Return to Endocrinology clinic as below: Future Appointments  Date Time Provider [place]  04/25/2022 11:50 AM Jj Enyeart, Melanie Crazier, MD [facility] None  05/12/2022  2:30 PM Mauri Pole, MD [facility]  06/10/2022  9:20 AM Colon Branch, MD [facility]

## 2022-04-25 NOTE — Patient Instructions (Addendum)
Start Glipizide 5 mg, 1 tablet before Breakfast  Continue Pioglitazone 30 mg daily  Switch Januvia to Rybelsus 3 mg , 1 tablet before Breakfast ( after a month, pick up 7 mg tablets) - if affordable with coupon     HOW TO TREAT LOW BLOOD SUGARS (Blood sugar LESS THAN 70 MG/DL) Please follow the RULE OF 15 for the treatment of hypoglycemia treatment (when your (blood sugars are less than 70 mg/dL)   STEP 1: Take 15 grams of carbohydrates when your blood sugar is low, which includes:  3-4 GLUCOSE TABS  OR 3-4 OZ OF JUICE OR REGULAR SODA OR ONE TUBE OF GLUCOSE GEL    STEP 2: RECHECK blood sugar in 15 MINUTES STEP 3: If your blood sugar is still low at the 15 minute recheck --> then, go back to STEP 1 and treat AGAIN with another 15 grams of carbohydrates.

## 2022-04-28 ENCOUNTER — Telehealth: Payer: Self-pay

## 2022-04-28 ENCOUNTER — Other Ambulatory Visit (HOSPITAL_COMMUNITY): Payer: Self-pay

## 2022-04-28 NOTE — Telephone Encounter (Signed)
Pharmacy patient advocate encounter  Received notification that prior authorization is required for Rybelsus '7MG'$  tablets. PA submitted and APPROVED on 04/28/2022.  Key [id number] Effective: 04/28/2022 - 04/27/2023

## 2022-04-28 NOTE — Telephone Encounter (Signed)
Per [place] needs PA.

## 2022-04-29 ENCOUNTER — Other Ambulatory Visit (HOSPITAL_COMMUNITY): Payer: Self-pay

## 2022-04-30 ENCOUNTER — Telehealth: Payer: Self-pay

## 2022-04-30 NOTE — Telephone Encounter (Signed)
Patient states that Rybelsus is still costing her [phone] with coupon. Patient has not started taking the sample yet. Patient will be getting a new insurance in January

## 2022-04-30 NOTE — Telephone Encounter (Signed)
Patient advised and verbalized understanding 

## 2022-05-12 ENCOUNTER — Encounter: Payer: Self-pay | Admitting: Gastroenterology

## 2022-05-12 ENCOUNTER — Ambulatory Visit (INDEPENDENT_AMBULATORY_CARE_PROVIDER_SITE_OTHER): Payer: BC Managed Care – PPO | Admitting: Gastroenterology

## 2022-05-12 VITALS — BP 118/72 | HR 95 | Ht 59.0 in | Wt 156.0 lb

## 2022-05-12 DIAGNOSIS — K760 Fatty (change of) liver, not elsewhere classified: Secondary | ICD-10-CM | POA: Diagnosis not present

## 2022-05-12 DIAGNOSIS — R195 Other fecal abnormalities: Secondary | ICD-10-CM | POA: Diagnosis not present

## 2022-05-12 MED ORDER — NA SULFATE-K SULFATE-MG SULF 17.5-3.13-1.6 GM/177ML PO SOLN: 1.0000 | Freq: Once | ORAL | 0 refills | Status: AC

## 2022-05-12 NOTE — Patient Instructions (Signed)
You have been scheduled for an endoscopy and colonoscopy. Please follow the written instructions given to you at your visit today. Please pick up your prep supplies at the pharmacy within the next 1-3 days. If you use inhalers (even only as needed), please bring them with you on the day of your procedure.  The [facility] providers would like to encourage you to use [facility] to communicate with providers for non-urgent requests or questions.  Due to long hold times on the telephone, sending your provider a message by [facility] may be a faster and more efficient way to get a response.  Please allow 48 business hours for a response.  Please remember that this is for non-urgent requests.   Due to recent changes in healthcare laws, you may see the results of your imaging and laboratory studies on MyChart before your provider has had a chance to review them.  We understand that in some cases there may be results that are confusing or concerning to you. Not all laboratory results come back in the same time frame and the provider may be waiting for multiple results in order to interpret others.  Please give [place] 48 hours in order for your provider to thoroughly review all the results before contacting the office for clarification of your results.

## 2022-05-15 ENCOUNTER — Encounter: Payer: Self-pay | Admitting: Gastroenterology

## 2022-05-15 NOTE — Progress Notes (Signed)
Barbara Flores    [id number]    December 10, 1960  Primary Care Physician:Paz, Alda Berthold, MD  Referring Physician: Colon Branch, MD [address] [place],  [place] [address]   Chief complaint: Positive fecal Hemoccult  HPI: 61 year old very pleasant female here to discuss positive fecal Hemoccult  Overall she has no specific GI complaints. Denies any nausea, vomiting, abdominal pain, melena or bright red blood per rectum She is s/p hemorrhoidal band ligation Denies frequent use of NSAIDs  History of fatty liver.she was last seen in office in 2017 for abnormal right upper quadrant ultrasound which showed fatty liver with focal fat sparing in the gallbladder fossa. HIDA scan was normal. LFTs are normal. MRI showed smaller focus of fatty sparring vs small vascular shunt in L hepatic lobe   Colonoscopy in February 2017 was normal except for small internal hemorrhoids and also external hemorrhoids.  Outpatient Encounter Medications as of 05/12/2022  Medication Sig   albuterol (PROVENTIL) (2.5 MG/3ML) 0.083% nebulizer solution Take 3 mLs (2.5 mg total) by nebulization every 6 (six) hours as needed for wheezing or shortness of breath.   albuterol (VENTOLIN HFA) 108 (90 Base) MCG/ACT inhaler INHALE 2 PUFFS BY MOUTH EVERY 6 HOURS AS NEEDED FOR WHEEZING OR SHORTNESS OF BREATH   aspirin 81 MG tablet Take 81 mg by mouth daily.   azelastine (ASTELIN) 0.1 % nasal spray Place 2 sprays into both nostrils at bedtime as needed for rhinitis. Use in each nostril as directed   FINACEA 15 % cream    fluticasone (FLONASE) 50 MCG/ACT nasal spray Place 2 sprays into both nostrils daily.   Fluticasone Propionate, Inhal, (FLOVENT DISKUS) 100 MCG/BLIST AEPB Take 2 puffs twice daily during flares. Rinse your mouth out after use.   glipiZIDE (GLUCOTROL) 5 MG tablet Take 1 tablet (5 mg total) by mouth daily before breakfast.   levocetirizine (XYZAL) 5 MG tablet Take 1 tablet (5 mg total) by mouth  every evening.   losartan-hydrochlorothiazide (HYZAAR) 100-25 MG tablet Take 1 tablet by mouth daily.   meloxicam (MOBIC) 15 MG tablet Take 1 tablet (15 mg total) by mouth daily as needed.   [EXPIRED] Na Sulfate-K Sulfate-Mg Sulf 17.5-3.13-1.6 GM/177ML SOLN Take 1 kit by mouth once for 1 dose.   nitroGLYCERIN (NITRODUR - DOSED IN MG/24 HR) 0.2 mg/hr patch Cut and apply 1/4 patch to most painful area q24h.   pioglitazone (ACTOS) 30 MG tablet Take 1 tablet (30 mg total) by mouth daily.   rosuvastatin (CRESTOR) 20 MG tablet TAKE 1 TABLET BY MOUTH AT BEDTIME   Semaglutide (RYBELSUS) 7 MG TABS Take 7 mg by mouth daily.   sitaGLIPtin (JANUVIA) 100 MG tablet Take 1 tablet by mouth once daily   No facility-administered encounter medications on file as of 05/12/2022.    Allergies as of 05/12/2022 - Review Complete 05/12/2022  Allergen Reaction Noted   Crab [shellfish allergy]  08/13/2017   Hydrocodone Nausea Only 10/09/2014   Penicillins  07/23/2010   Amoxicillin Rash 03/31/2014    Past Medical History:  Diagnosis Date   Allergy    Asthma    Diabetes mellitus 08/23/2010   GERD (gastroesophageal reflux disease)    History of exercise stress test 07-2010   normal except for elevated BP   Hyperlipidemia    Hypertension    Menopause    Rosacea     Past Surgical History:  Procedure Laterality Date   Eardrum surgery  HEMORRHOID SURGERY     NASAL SINUS SURGERY     OTHER SURGICAL HISTORY     female surgery after miscarriage, ? 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  Tobacco Use   Smoking status: Never   Smokeless tobacco: Never  Scientific laboratory technician Use: Never used  Substance and Sexual Activity   Alcohol use: Not Currently   Drug use: No   Sexual activity: Not on file  Other Topics Concern   Not on file  Social History Narrative   Household --pt, husband    2 children-- son 1, daughter 34     Social Determinants of Radio broadcast assistant Strain: Not on file  Food Insecurity: Not on file  Transportation Needs: Not on file  Physical Activity: Not on file  Stress: Not on file  Social Connections: Not on file  Intimate Partner Violence: Not on file      Review of systems: All other review of systems negative except as mentioned in the HPI.   Physical Exam: Vitals:   05/12/22 1430  BP: 118/72  Pulse: 95   Body mass index is 31.51 kg/m. Gen:      No acute distress HEENT:  sclera anicteric Abd:      soft, non-tender; no palpable masses, no distension Ext:    No edema Neuro: alert and oriented x 3 Psych: normal mood and affect  Data Reviewed:  Reviewed labs, radiology imaging, old records and pertinent past GI work up   Assessment and Plan/Recommendations:  61 year old very pleasant female with positive fecal Hemoccult Scheduled for EGD and colonoscopy for further evaluation, will need to exclude erosive gastritis, H. pylori or any neoplastic lesion If EGD and colonoscopy unremarkable, negative for any source of GI blood loss will plan to do small bowel video capsule to further evaluate  The risks and benefits as well as alternatives of endoscopic procedure(s) have been discussed and reviewed. All questions answered. The patient agrees to proceed.   Fatty liver: Mild elevation in ALT, continue to monitor  Avoid alcohol, NSAIDs  and herbal hepatotoxins Avoid simple carbohydrates and sugar Daily exercise Repeat LFT in 6 months  The patient was provided an opportunity to ask questions and all were answered. The patient agreed with the plan and demonstrated an understanding of the instructions.  [name] , MD    CC: Colon Branch, MD

## 2022-05-23 ENCOUNTER — Ambulatory Visit: Payer: BC Managed Care – PPO | Admitting: Internal Medicine

## 2022-06-06 ENCOUNTER — Ambulatory Visit: Payer: BC Managed Care – PPO | Admitting: Internal Medicine

## 2022-06-10 ENCOUNTER — Ambulatory Visit (INDEPENDENT_AMBULATORY_CARE_PROVIDER_SITE_OTHER): Payer: BC Managed Care – PPO | Admitting: Internal Medicine

## 2022-06-10 VITALS — BP 140/72 | HR 93 | Temp 97.7°F | Resp 18 | Ht 59.0 in | Wt 158.8 lb

## 2022-06-10 DIAGNOSIS — E785 Hyperlipidemia, unspecified: Secondary | ICD-10-CM

## 2022-06-10 DIAGNOSIS — I10 Essential (primary) hypertension: Secondary | ICD-10-CM

## 2022-06-10 LAB — BASIC METABOLIC PANEL
BUN: 12 mg/dL (ref 6–23)
CO2: 23 mEq/L (ref 19–32)
Calcium: 9.5 mg/dL (ref 8.4–10.5)
Chloride: 105 mEq/L (ref 96–112)
Creatinine, Ser: 0.71 mg/dL (ref 0.40–1.20)
GFR: 93.14 mL/min (ref >60.00)
Glucose, Bld: 131 mg/dL — ABNORMAL HIGH (ref 70–99)
Potassium: 3.7 mEq/L (ref 3.5–5.1)
Sodium: 140 mEq/L (ref 135–145)

## 2022-06-10 LAB — LIPID PANEL
Cholesterol: 147 mg/dL (ref 0–200)
HDL: 61.4 mg/dL (ref >39.00)
LDL Cholesterol: 70 mg/dL (ref 0–99)
NonHDL: 85.4
Total CHOL/HDL Ratio: 2
Triglycerides: 76 mg/dL (ref 0.0–149.0)
VLDL: 15.2 mg/dL (ref 0.0–40.0)

## 2022-06-10 NOTE — Progress Notes (Signed)
Subjective:    Patient ID: Barbara Flores, female    DOB: May 10, 1961, 61 y.o.   MRN: [id number]  DOS:  06/10/2022 Type of visit - description: Follow-up  Since the last office visit, a stool test came back positive.  Saw GI, note reviewed. Denies nausea vomiting.  No stomach pain.  No change in the color of the stools  Also saw Endo, note reviewed.   Review of Systems See above   Past Medical History:  Diagnosis Date   Allergy    Asthma    Diabetes mellitus 08/23/2010   GERD (gastroesophageal reflux disease)    History of exercise stress test 07-2010   normal except for elevated BP   Hyperlipidemia    Hypertension    Menopause    Rosacea     Past Surgical History:  Procedure Laterality Date   Eardrum surgery     HEMORRHOID SURGERY     NASAL SINUS SURGERY     OTHER SURGICAL HISTORY     female surgery after miscarriage, ? D&C or edometrial surgery    Current Outpatient Medications  Medication Instructions   albuterol (PROVENTIL) 2.5 mg, Nebulization, Every 6 hours PRN   albuterol (VENTOLIN HFA) 108 (90 Base) MCG/ACT inhaler INHALE 2 PUFFS BY MOUTH EVERY 6 HOURS AS NEEDED FOR WHEEZING OR SHORTNESS OF BREATH   aspirin 81 mg, Oral, Daily,     azelastine (ASTELIN) 0.1 % nasal spray 2 sprays, Each Nare, At bedtime PRN, Use in each nostril as directed   FINACEA 15 % cream No dose, route, or frequency recorded.   fluticasone (FLONASE) 50 MCG/ACT nasal spray 2 sprays, Each Nare, Daily   Fluticasone Propionate, Inhal, (FLOVENT DISKUS) 100 MCG/BLIST AEPB Take 2 puffs twice daily during flares. Rinse your mouth out after use.   glipiZIDE (GLUCOTROL) 5 mg, Oral, Daily before breakfast   levocetirizine (XYZAL) 5 mg, Oral, Every evening   losartan-hydrochlorothiazide (HYZAAR) 100-25 MG tablet 1 tablet, Oral, Daily   meloxicam (MOBIC) 15 mg, Oral, Daily PRN   nitroGLYCERIN (NITRODUR - DOSED IN MG/24 HR) 0.2 mg/hr patch Cut and apply 1/4 patch to most painful area q24h.   pioglitazone  (ACTOS) 30 mg, Oral, Daily   rosuvastatin (CRESTOR) 20 MG tablet TAKE 1 TABLET BY MOUTH AT BEDTIME   Rybelsus 7 mg, Oral, Daily   sitaGLIPtin (JANUVIA) 100 MG tablet Take 1 tablet by mouth once daily       Objective:   Physical Exam BP (!) 150/82 (BP Location: Left Arm, Patient Position: Sitting, Cuff Size: Normal)   Pulse 93   Temp 97.7 F (36.5 C) (Temporal)   Resp 18   Wt 158 lb 12.8 oz (72 kg)   SpO2 97%   BMI 32.07 kg/m  General:   Well developed, NAD, BMI noted. HEENT:  Normocephalic . Face symmetric, atraumatic Lungs:  CTA B Normal respiratory effort, no intercostal retractions, no accessory muscle use. Heart: RRR,  no murmur.  Lower extremities: no pretibial edema bilaterally  Skin: Not pale. Not jaundice Neurologic:  alert & oriented X3.  Speech normal, gait appropriate for age and unassisted Psych--  Cognition and judgment appear intact.  Cooperative with normal attention span and concentration.  Behavior appropriate. No anxious or depressed appearing.      Assessment     Assessment  Diabetes 2012 (Metformin intolerant 10/2019) HTN Dyslipidemia (intol to atorvastatin per patient.  Crestor DC'd 2015 due to cost) asthma Menopause at age 52 Rosacea GI: --RUQ pain: Ultrasound and HIDA scan  negative (02-2015, 03-2015) -- 07/2016--  H Pylori  (+) , partially treated biaxin/flagel, poor tolerance  --constipation  Stress test 2012 neg , elevated BP +FH CAD brother MI 51 y/o  PLAN DM: Saw Endo 04/25/2022, A1c 9.4.  Recommend good compliance with medication, diet and exercise. HTN: Currently on Hyzaar, upon arrival BP was slightly elevated, recheck: 140/72.  At home she gets similar readings (140s). No change, check BMP. Dyslipidemia: On rosuvastatin, check FLP. + Hemoccult: Saw GI 05/12/2022, + fecal Hemoccult,  Rx a EGD and colonoscopy.  Considering also a capsule endoscopy.  Procedures pending. Preventive care:  Plans to get a flu shot @ pharmacy  Covid  booster rec   had a MMG 11/2021, to see gyn next week RTC CPX 6 months

## 2022-06-10 NOTE — Assessment & Plan Note (Signed)
DM: Saw Endo 04/25/2022, A1c 9.4.  Recommend good compliance with medication, diet and exercise. HTN: Currently on Hyzaar, upon arrival BP was slightly elevated, recheck: 140/72.  At home she gets similar readings (140s). No change, check BMP. Dyslipidemia: On rosuvastatin, check FLP. + Hemoccult: Saw GI 05/12/2022, + fecal Hemoccult,  Rx a EGD and colonoscopy.  Considering also a capsule endoscopy.  Procedures pending. Preventive care:  Plans to get a flu shot @ pharmacy  Covid booster rec   had a MMG 11/2021, to see gyn next week RTC CPX 6 months

## 2022-06-10 NOTE — Patient Instructions (Addendum)
Check the  blood pressure regularly BP GOAL is between 110/65 and  135/85. If it is consistently higher or lower, let me know      GO TO THE LAB : Get the blood work     GO TO THE FRONT DESK, PLEASE SCHEDULE YOUR APPOINTMENTS Come back for   a physical exam in 6 months 

## 2022-06-18 ENCOUNTER — Encounter: Payer: Self-pay | Admitting: Gastroenterology

## 2022-06-25 ENCOUNTER — Ambulatory Visit (AMBULATORY_SURGERY_CENTER): Payer: BC Managed Care – PPO | Admitting: Gastroenterology

## 2022-06-25 ENCOUNTER — Encounter: Payer: Self-pay | Admitting: Gastroenterology

## 2022-06-25 VITALS — BP 114/70 | HR 78 | Temp 98.4°F | Resp 17 | Ht 59.0 in | Wt 156.0 lb

## 2022-06-25 DIAGNOSIS — D120 Benign neoplasm of cecum: Secondary | ICD-10-CM | POA: Diagnosis not present

## 2022-06-25 DIAGNOSIS — K649 Unspecified hemorrhoids: Secondary | ICD-10-CM | POA: Diagnosis not present

## 2022-06-25 DIAGNOSIS — K2950 Unspecified chronic gastritis without bleeding: Secondary | ICD-10-CM | POA: Diagnosis not present

## 2022-06-25 DIAGNOSIS — K760 Fatty (change of) liver, not elsewhere classified: Secondary | ICD-10-CM

## 2022-06-25 DIAGNOSIS — K31A0 Gastric intestinal metaplasia, unspecified: Secondary | ICD-10-CM | POA: Diagnosis not present

## 2022-06-25 DIAGNOSIS — R195 Other fecal abnormalities: Secondary | ICD-10-CM | POA: Diagnosis not present

## 2022-06-25 DIAGNOSIS — K449 Diaphragmatic hernia without obstruction or gangrene: Secondary | ICD-10-CM

## 2022-06-25 DIAGNOSIS — K2951 Unspecified chronic gastritis with bleeding: Secondary | ICD-10-CM | POA: Diagnosis not present

## 2022-06-25 MED ORDER — SODIUM CHLORIDE 0.9 % IV SOLN: 500.0000 mL | Freq: Once | INTRAVENOUS | Status: DC

## 2022-06-25 MED ORDER — PANTOPRAZOLE SODIUM 40 MG PO TBEC: DELAYED_RELEASE_TABLET | ORAL | 3 refills | Status: AC

## 2022-06-25 NOTE — Progress Notes (Unsigned)
[facility] History and Physical   Primary Care Physician:  Colon Branch, MD   Reason for Procedure:  Fecal heme occult positive  Plan:    EGD and colonoscopy with possible interventions as needed     HPI: Barbara Flores is a very pleasant 62 y.o. female here for EGD and colonoscopy for evaluation of positive heme occult.   The risks and benefits as well as alternatives of endoscopic procedure(s) have been discussed and reviewed. All questions answered. The patient agrees to proceed.    Past Medical History:  Diagnosis Date   Allergy    Asthma    Diabetes mellitus 08/23/2010   GERD (gastroesophageal reflux disease)    History of exercise stress test 07-2010   normal except for elevated BP   Hyperlipidemia    Hypertension    Menopause    Rosacea     Past Surgical History:  Procedure Laterality Date   COLONOSCOPY     Eardrum surgery     HEMORRHOID SURGERY     NASAL SINUS SURGERY     OTHER SURGICAL HISTORY     female surgery after miscarriage, ? D&C or edometrial surgery   UPPER GASTROINTESTINAL ENDOSCOPY      Prior to Admission medications   Medication Sig Start Date End Date Taking? Authorizing Provider  aspirin 81 MG tablet Take 81 mg by mouth daily.   Yes [provider]  JARDIANCE 25 MG TABS tablet Take 25 mg by mouth every morning. 06/17/22  Yes [provider]  losartan-hydrochlorothiazide (HYZAAR) 100-25 MG tablet Take 1 tablet by mouth daily. 04/07/22  Yes Paz, Alda Berthold, MD  rosuvastatin (CRESTOR) 20 MG tablet TAKE 1 TABLET BY MOUTH AT BEDTIME 01/29/22  Yes Paz, Alda Berthold, MD  sitaGLIPtin (JANUVIA) 100 MG tablet Take 1 tablet by mouth once daily 12/23/21  Yes Paz, Alda Berthold, MD  albuterol (PROVENTIL) (2.5 MG/3ML) 0.083% nebulizer solution Take 3 mLs (2.5 mg total) by nebulization every 6 (six) hours as needed for wheezing or shortness of breath. 12/12/20   Nani Ravens, Crosby Oyster, DO  albuterol (VENTOLIN HFA) 108 (90 Base) MCG/ACT inhaler INHALE  2 PUFFS BY MOUTH EVERY 6 HOURS AS NEEDED FOR WHEEZING OR SHORTNESS OF BREATH 04/29/21   Colon Branch, MD  azelastine (ASTELIN) 0.1 % nasal spray Place 2 sprays into both nostrils at bedtime as needed for rhinitis. Use in each nostril as directed 09/10/15   Colon Branch, MD  FINACEA 15 % cream  06/22/12   [provider]  fluticasone (FLONASE) 50 MCG/ACT nasal spray Place 2 sprays into both nostrils daily. 08/26/18   Wendling, Crosby Oyster, DO  Fluticasone Propionate, Inhal, (FLOVENT DISKUS) 100 MCG/BLIST AEPB Take 2 puffs twice daily during flares. Rinse your mouth out after use. 12/07/20   Wendling, Crosby Oyster, DO  glipiZIDE (GLUCOTROL) 5 MG tablet Take 1 tablet (5 mg total) by mouth daily before breakfast. Patient not taking: Reported on 06/25/2022 04/25/22   Shamleffer, Melanie Crazier, MD  levocetirizine (XYZAL) 5 MG tablet Take 1 tablet (5 mg total) by mouth every evening. 08/26/18   Shelda Pal, DO  meloxicam (MOBIC) 15 MG tablet Take 1 tablet (15 mg total) by mouth daily as needed. Patient not taking: Reported on 06/10/2022 01/29/22   Rosemarie Ax, MD  nitroGLYCERIN (NITRODUR - DOSED IN MG/24 HR) 0.2 mg/hr patch Cut and apply 1/4 patch to most painful area q24h. 02/10/22   Rosemarie Ax, MD  pioglitazone (ACTOS) 30 MG tablet  Take 1 tablet (30 mg total) by mouth daily. Patient not taking: Reported on 06/25/2022 04/25/22   Shamleffer, Melanie Crazier, MD  Semaglutide (RYBELSUS) 7 MG TABS Take 7 mg by mouth daily. Patient not taking: Reported on 06/25/2022 04/25/22   Shamleffer, Melanie Crazier, MD    Current Outpatient Medications  Medication Sig Dispense Refill   aspirin 81 MG tablet Take 81 mg by mouth daily.     JARDIANCE 25 MG TABS tablet Take 25 mg by mouth every morning.     losartan-hydrochlorothiazide (HYZAAR) 100-25 MG tablet Take 1 tablet by mouth daily. 90 tablet 1   rosuvastatin (CRESTOR) 20 MG tablet TAKE 1 TABLET BY MOUTH AT BEDTIME 90 tablet 1    sitaGLIPtin (JANUVIA) 100 MG tablet Take 1 tablet by mouth once daily 90 tablet 1   albuterol (PROVENTIL) (2.5 MG/3ML) 0.083% nebulizer solution Take 3 mLs (2.5 mg total) by nebulization every 6 (six) hours as needed for wheezing or shortness of breath. 150 mL 1   albuterol (VENTOLIN HFA) 108 (90 Base) MCG/ACT inhaler INHALE 2 PUFFS BY MOUTH EVERY 6 HOURS AS NEEDED FOR WHEEZING OR SHORTNESS OF BREATH 9 g 0   azelastine (ASTELIN) 0.1 % nasal spray Place 2 sprays into both nostrils at bedtime as needed for rhinitis. Use in each nostril as directed 30 mL 3   FINACEA 15 % cream      fluticasone (FLONASE) 50 MCG/ACT nasal spray Place 2 sprays into both nostrils daily. 16 g 6   Fluticasone Propionate, Inhal, (FLOVENT DISKUS) 100 MCG/BLIST AEPB Take 2 puffs twice daily during flares. Rinse your mouth out after use. 60 each 2   glipiZIDE (GLUCOTROL) 5 MG tablet Take 1 tablet (5 mg total) by mouth daily before breakfast. (Patient not taking: Reported on 06/25/2022) 90 tablet 1   levocetirizine (XYZAL) 5 MG tablet Take 1 tablet (5 mg total) by mouth every evening. 30 tablet 3   meloxicam (MOBIC) 15 MG tablet Take 1 tablet (15 mg total) by mouth daily as needed. (Patient not taking: Reported on 06/10/2022) 45 tablet 1   nitroGLYCERIN (NITRODUR - DOSED IN MG/24 HR) 0.2 mg/hr patch Cut and apply 1/4 patch to most painful area q24h. 30 patch 11   pioglitazone (ACTOS) 30 MG tablet Take 1 tablet (30 mg total) by mouth daily. (Patient not taking: Reported on 06/25/2022) 90 tablet 1   Semaglutide (RYBELSUS) 7 MG TABS Take 7 mg by mouth daily. (Patient not taking: Reported on 06/25/2022) 90 tablet 3   Current Facility-Administered Medications  Medication Dose Route Frequency Provider Last Rate Last Admin   0.9 %  sodium chloride infusion  500 mL Intravenous Once Mauri Pole, MD        Allergies as of 06/25/2022 - Review Complete 06/25/2022  Allergen Reaction Noted   Crab [shellfish allergy]  08/13/2017    Hydrocodone Nausea Only 10/09/2014   Penicillins  07/23/2010   Amoxicillin Rash 03/31/2014    Family History  Problem Relation Age of Onset   Diabetes Mother    Hyperlipidemia Mother    Hypertension Mother    Stroke Mother 36   Kidney failure Mother    Stomach cancer Father        deceased   Breast cancer Sister 6   Heart attack Brother 60   Colon cancer Neg Hx    Esophageal cancer Neg Hx    Rectal cancer Neg Hx     Social History   Socioeconomic History   Marital status: Married  Spouse name: Not on file   Number of children: 2   Years of education: Not on file   Highest education level: Not on file  Occupational History   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  Tobacco Use   Smoking status: Never   Smokeless tobacco: Never  Vaping Use   Vaping Use: Never used  Substance and Sexual Activity   Alcohol use: Yes    Comment: rarely   Drug use: No   Sexual activity: Not on file  Other Topics Concern   Not on file  Social History Narrative   Household --pt, husband    2 children-- son 55, daughter 8     Social Determinants of Radio broadcast assistant Strain: Not on file  Food Insecurity: Not on file  Transportation Needs: Not on file  Physical Activity: Not on file  Stress: Not on file  Social Connections: Not on file  Intimate Partner Violence: Not on file    Review of Systems:  All other review of systems negative except as mentioned in the HPI.  Physical Exam: Vital signs in last 24 hours: Blood Pressure (Abnormal) 178/83   Pulse (Abnormal) 102   Temperature 98.4 F (36.9 C) (Temporal)   Respiration 17   Height '4\' 11"'$  (1.499 m)   Weight 156 lb (70.8 kg)   Last Menstrual Period 03/08/2016   Oxygen Saturation 100%   Body Mass Index 31.51 kg/m  General:   Alert, NAD Lungs:  Clear .   Heart:  Regular rate and rhythm Abdomen:  Soft, nontender and nondistended. Neuro/Psych:  Alert and cooperative. Normal mood  and affect. A and O x 3  Reviewed labs, radiology imaging, old records and pertinent past GI work up  Patient is appropriate for planned procedure(s) and anesthesia in an ambulatory setting   K. Denzil Magnuson , MD [phone]

## 2022-06-25 NOTE — Patient Instructions (Addendum)
Handouts on polyps, hemorrhoids, gastritis, and hiatal hernia given to patient. Await pathology results. Resume previous diet and continue present medications. Repeat colonoscopy in 5-10 years based off of pathology results. Pick up prescription for Protonix (pantoprazole) 40 mg twice a day for 3 months followed by 40 mg once daily thereafter. -  Return to GI office in 2-3 months, next available appointment (call to schedule)  No aspirin, ibuprofen, naproxen, or other non-steroidal anti-inflammatory drugs    YOU HAD AN ENDOSCOPIC PROCEDURE TODAY AT [facility]:   Refer to the procedure report that was given to you for any specific questions about what was found during the examination.  If the procedure report does not answer your questions, please call your gastroenterologist to clarify.  If you requested that your care partner not be given the details of your procedure findings, then the procedure report has been included in a sealed envelope for you to review at your convenience later.  YOU SHOULD EXPECT: Some feelings of bloating in the abdomen. Passage of more gas than usual.  Walking can help get rid of the air that was put into your GI tract during the procedure and reduce the bloating. If you had a lower endoscopy (such as a colonoscopy or flexible sigmoidoscopy) you may notice spotting of blood in your stool or on the toilet paper. If you underwent a bowel prep for your procedure, you may not have a normal bowel movement for a few days.  Please Note:  You might notice some irritation and congestion in your nose or some drainage.  This is from the oxygen used during your procedure.  There is no need for concern and it should clear up in a day or so.  SYMPTOMS TO REPORT IMMEDIATELY:  Following lower endoscopy (colonoscopy or flexible sigmoidoscopy):  Excessive amounts of blood in the stool  Significant tenderness or worsening of abdominal pains  Swelling of the abdomen  that is new, acute  Fever of 100F or higher  Following upper endoscopy (EGD)  Vomiting of blood or coffee ground material  New chest pain or pain under the shoulder blades  Painful or persistently difficult swallowing  New shortness of breath  Fever of 100F or higher  Black, tarry-looking stools  For urgent or emergent issues, a gastroenterologist can be reached at any hour by calling [phone]. Do not use MyChart messaging for urgent concerns.    DIET:  We do recommend a small meal at first, but then you may proceed to your regular diet.  Drink plenty of fluids but you should avoid alcoholic beverages for 24 hours.  ACTIVITY:  You should plan to take it easy for the rest of today and you should NOT DRIVE or use heavy machinery until tomorrow (because of the sedation medicines used during the test).    FOLLOW UP: Our staff will call the number listed on your records the next business day following your procedure.  We will call around 7:15- 8:00 am to check on you and address any questions or concerns that you may have regarding the information given to you following your procedure. If we do not reach you, we will leave a message.     If any biopsies were taken you will be contacted by phone or by letter within the next 1-3 weeks.  Please call us at [phone] if you have not heard about the biopsies in 3 weeks.    SIGNATURES/CONFIDENTIALITY: You and/or your care partner have signed  paperwork which will be entered into your electronic medical record.  These signatures attest to the fact that that the information above on your After Visit Summary has been reviewed and is understood.  Full responsibility of the confidentiality of this discharge information lies with you and/or your care-partner.

## 2022-06-25 NOTE — Progress Notes (Unsigned)
Called to room to assist during endoscopic procedure.  Patient ID and intended procedure confirmed with present staff. Received instructions for my participation in the procedure from the performing physician.  

## 2022-06-25 NOTE — Op Note (Signed)
[place] Patient Name: Barbara Flores Procedure Date: 06/25/2022 7:47 AM MRN: [id number] Endoscopist: Mauri Pole , MD, [id number] Age: 62 Referring MD:  Date of Birth: Dec 06, 1960 Gender: Female Account #: [id number] Procedure:                Upper GI endoscopy Indications:              Occult blood in stool Medicines:                Monitored Anesthesia Care Procedure:                Pre-Anesthesia Assessment:                           - Prior to the procedure, a History and Physical                            was performed, and patient medications and                            allergies were reviewed. The patient's tolerance of                            previous anesthesia was also reviewed. The risks                            and benefits of the procedure and the sedation                            options and risks were discussed with the patient.                            All questions were answered, and informed consent                            was obtained. Prior Anticoagulants: The patient has                            taken no anticoagulant or antiplatelet agents. ASA                            Grade Assessment: II - A patient with mild systemic                            disease. After reviewing the risks and benefits,                            the patient was deemed in satisfactory condition to                            undergo the procedure.                           After obtaining informed consent, the endoscope was  passed under direct vision. Throughout the                            procedure, the patient's blood pressure, pulse, and                            oxygen saturations were monitored continuously. The                            GIF HQ190 #[id number] was introduced through the                            mouth, and advanced to the second part of duodenum.                            The upper GI endoscopy was  accomplished without                            difficulty. The patient tolerated the procedure                            well. Scope In: Scope Out: Findings:                 The Z-line was regular and was found 36 cm from the                            incisors.                           No gross lesions were noted in the entire esophagus.                           A 2 cm hiatal hernia was present.                           Patchy moderate inflammation with hemorrhage                            characterized by congestion (edema), erosions,                            erythema, linear erosions and shallow ulcerations                            was found in the gastric body, at the incisura, in                            the gastric antrum, in the prepyloric region of the                            stomach and at the pylorus. Biopsies were taken                            with a  cold forceps for histology. Biopsies were                            taken with a cold forceps for Helicobacter pylori                            testing.                           The cardia and gastric fundus were normal on                            retroflexion.                           The examined duodenum was normal. Complications:            No immediate complications. Estimated Blood Loss:     Estimated blood loss was minimal. Impression:               - Z-line regular, 36 cm from the incisors.                           - No gross lesions in the entire esophagus.                           - 2 cm hiatal hernia.                           - Erosive gastritis with hemorrhage. Biopsied.                           - Normal examined duodenum. Recommendation:           - Patient has a contact number available for                            emergencies. The signs and symptoms of potential                            delayed complications were discussed with the                            patient. Return to  normal activities tomorrow.                            Written discharge instructions were provided to the                            patient.                           - Resume previous diet.                           - Continue present medications.                           -  Await pathology results.                           - No aspirin, ibuprofen, naproxen, or other                            non-steroidal anti-inflammatory drugs.                           - Use Protonix (pantoprazole) 40 mg PO BID for 3                            months followed by '40mg'$  once daily                           - Return to GI office in 2-3 months, next available                            appointment Mauri Pole, MD 06/25/2022 8:51:20 AM This report has been signed electronically.

## 2022-06-25 NOTE — Op Note (Signed)
[facility] Patient Name: [place] Oborn Procedure Date: 06/25/2022 7:47 AM MRN: [id number] Endoscopist: Mauri Pole , MD, [id number] Age: 62 Referring MD:  Date of Birth: 04-29-1961 Gender: Female Account #: [id number] Procedure:                Colonoscopy Indications:              Evaluation of unexplained GI bleeding presenting                            with fecal occult blood Medicines:                Monitored Anesthesia Care Procedure:                Pre-Anesthesia Assessment:                           - Prior to the procedure, a History and Physical                            was performed, and patient medications and                            allergies were reviewed. The patient's tolerance of                            previous anesthesia was also reviewed. The risks                            and benefits of the procedure and the sedation                            options and risks were discussed with the patient.                            All questions were answered, and informed consent                            was obtained. Prior Anticoagulants: The patient has                            taken no anticoagulant or antiplatelet agents. ASA                            Grade Assessment: II - A patient with mild systemic                            disease. After reviewing the risks and benefits,                            the patient was deemed in satisfactory condition to                            undergo the procedure.  After obtaining informed consent, the colonoscope                            was passed under direct vision. Throughout the                            procedure, the patient's blood pressure, pulse, and                            oxygen saturations were monitored continuously. The                            [id number]-HQ190L Colonoscope was introduced through the                            anus and advanced to the the  cecum, identified by                            appendiceal orifice and ileocecal valve. The                            colonoscopy was performed without difficulty. The                            patient tolerated the procedure well. The quality                            of the bowel preparation was excellent. The                            ileocecal valve, appendiceal orifice, and rectum                            were photographed. Scope In: 8:17:11 AM Scope Out: 8:28:13 AM Scope Withdrawal Time: 0 hours 7 minutes 24 seconds  Total Procedure Duration: 0 hours 11 minutes 2 seconds  Findings:                 The perianal and digital rectal examinations were                            normal.                           A 3 mm polyp was found in the cecum. The polyp was                            sessile. The polyp was removed with a cold snare.                            Resection and retrieval were complete.                           Non-bleeding external and internal hemorrhoids were  found during retroflexion. The hemorrhoids were                            small. Complications:            No immediate complications. Estimated blood loss:                            Minimal. Impression:               - One 3 mm polyp in the cecum, removed with a cold                            snare. Resected and retrieved.                           - Non-bleeding external and internal hemorrhoids. Recommendation:           - Patient has a contact number available for                            emergencies. The signs and symptoms of potential                            delayed complications were discussed with the                            patient. Return to normal activities tomorrow.                            Written discharge instructions were provided to the                            patient.                           - Resume previous diet.                            - Continue present medications.                           - Await pathology results.                           - Repeat colonoscopy in 5-10 years for surveillance                            based on pathology results. Mauri Pole, MD 06/25/2022 8:48:08 AM This report has been signed electronically.

## 2022-06-25 NOTE — Progress Notes (Unsigned)
Report to pacu rn. Vss. Care resumed by rn. 

## 2022-06-26 ENCOUNTER — Telehealth: Payer: Self-pay

## 2022-06-26 ENCOUNTER — Encounter: Payer: Self-pay | Admitting: Gastroenterology

## 2022-06-26 NOTE — Telephone Encounter (Signed)
No answer, left message to call if having any issues or concerns, B.Nalah Macioce RN 

## 2022-06-27 DIAGNOSIS — Z6831 Body mass index (BMI) 31.0-31.9, adult: Secondary | ICD-10-CM | POA: Diagnosis not present

## 2022-06-27 DIAGNOSIS — Z01419 Encounter for gynecological examination (general) (routine) without abnormal findings: Secondary | ICD-10-CM | POA: Diagnosis not present

## 2022-07-01 ENCOUNTER — Telehealth: Payer: Self-pay | Admitting: Internal Medicine

## 2022-07-01 NOTE — Telephone Encounter (Signed)
Left detailed vm for patient regarding her medications

## 2022-07-01 NOTE — Telephone Encounter (Signed)
Patient states that she is taking Jardiance and wants to know if she is supposed to be taking anything else.

## 2022-07-03 ENCOUNTER — Encounter: Payer: Self-pay | Admitting: Gastroenterology

## 2022-07-18 DIAGNOSIS — H35033 Hypertensive retinopathy, bilateral: Secondary | ICD-10-CM | POA: Diagnosis not present

## 2022-07-18 DIAGNOSIS — E119 Type 2 diabetes mellitus without complications: Secondary | ICD-10-CM | POA: Diagnosis not present

## 2022-07-18 DIAGNOSIS — H25012 Cortical age-related cataract, left eye: Secondary | ICD-10-CM | POA: Diagnosis not present

## 2022-07-18 DIAGNOSIS — H11153 Pinguecula, bilateral: Secondary | ICD-10-CM | POA: Diagnosis not present

## 2022-07-18 LAB — HM DIABETES EYE EXAM

## 2022-08-05 ENCOUNTER — Encounter: Payer: Self-pay | Admitting: Gastroenterology

## 2022-08-06 ENCOUNTER — Telehealth: Payer: Self-pay | Admitting: Internal Medicine

## 2022-08-06 ENCOUNTER — Ambulatory Visit (INDEPENDENT_AMBULATORY_CARE_PROVIDER_SITE_OTHER): Payer: BC Managed Care – PPO | Admitting: Family

## 2022-08-06 ENCOUNTER — Encounter: Payer: Self-pay | Admitting: Family

## 2022-08-06 VITALS — BP 152/78 | HR 102 | Temp 97.9°F | Resp 16 | Wt 155.0 lb

## 2022-08-06 DIAGNOSIS — K529 Noninfective gastroenteritis and colitis, unspecified: Secondary | ICD-10-CM | POA: Diagnosis not present

## 2022-08-06 DIAGNOSIS — H8113 Benign paroxysmal vertigo, bilateral: Secondary | ICD-10-CM

## 2022-08-06 DIAGNOSIS — E119 Type 2 diabetes mellitus without complications: Secondary | ICD-10-CM

## 2022-08-06 DIAGNOSIS — I10 Essential (primary) hypertension: Secondary | ICD-10-CM | POA: Diagnosis not present

## 2022-08-06 LAB — CBC WITH DIFFERENTIAL/PLATELET
Basophils Absolute: 0 10*3/uL (ref 0.0–0.1)
Basophils Relative: 0.5 % (ref 0.0–3.0)
Eosinophils Absolute: 0.1 10*3/uL (ref 0.0–0.7)
Eosinophils Relative: 1.1 % (ref 0.0–5.0)
HCT: 41.8 % (ref 36.0–46.0)
Hemoglobin: 14.5 g/dL (ref 12.0–15.0)
Lymphocytes Relative: 33.3 % (ref 12.0–46.0)
Lymphs Abs: 2.8 10*3/uL (ref 0.7–4.0)
MCHC: 34.7 g/dL (ref 30.0–36.0)
MCV: 85.7 fl (ref 78.0–100.0)
Monocytes Absolute: 0.6 10*3/uL (ref 0.1–1.0)
Monocytes Relative: 7 % (ref 3.0–12.0)
Neutro Abs: 4.8 10*3/uL (ref 1.4–7.7)
Neutrophils Relative %: 58.1 % (ref 43.0–77.0)
Platelets: 264 10*3/uL (ref 150.0–400.0)
RBC: 4.88 Mil/uL (ref 3.87–5.11)
RDW: 14.3 % (ref 11.5–15.5)
WBC: 8.3 10*3/uL (ref 4.0–10.5)

## 2022-08-06 MED ORDER — BLOOD GLUCOSE MONITORING SUPPL DEVI: 1.0000 | Freq: Three times a day (TID) | 0 refills | Status: AC

## 2022-08-06 MED ORDER — LANCET DEVICE MISC: 1.0000 | Freq: Three times a day (TID) | 0 refills | Status: AC

## 2022-08-06 MED ORDER — BLOOD GLUCOSE TEST VI STRP: 1.0000 | ORAL_STRIP | Freq: Three times a day (TID) | 0 refills | Status: DC

## 2022-08-06 MED ORDER — MECLIZINE HCL 25 MG PO TABS: 25.0000 mg | ORAL_TABLET | Freq: Three times a day (TID) | ORAL | 0 refills | Status: AC | PRN

## 2022-08-06 MED ORDER — AMLODIPINE BESYLATE 2.5 MG PO TABS: 2.5000 mg | ORAL_TABLET | Freq: Every day | ORAL | 0 refills | Status: AC

## 2022-08-06 MED ORDER — LANCETS MISC. MISC: 1.0000 | Freq: Three times a day (TID) | 0 refills | Status: AC

## 2022-08-06 NOTE — Telephone Encounter (Signed)
Caller Name [place] Phone Number [phone] Patient Name Barbara Flores Patient DOB [birth year] Call Type Message Only Information Provided Reason for Call Request to Schedule Office Appointment Initial Comment Caller needs to schedule an appt as she is experiencing dizziness, along with vomiting. Disp. Time Disposition Final User 08/06/2022 9:35:05 AM General Information Provided Yes Rhea Belton Call Closed By: Rhea Belton Transaction Date/Time: 08/06/2022 9:29:17 AM (ET)

## 2022-08-06 NOTE — Telephone Encounter (Signed)
Pt called stating that she was having issues with dizziness to the point where she felt like she was going to completely lose her balance. Pt stated she had been having this issue on and off since 2.18.24. Pt was transferred to triage nurse for further eval.

## 2022-08-06 NOTE — Telephone Encounter (Signed)
Appt scheduled w/ Melissa.

## 2022-08-06 NOTE — Progress Notes (Signed)
Subjective:   By signing my name below, I, [name], attest that this documentation has been prepared under the direction and in the presence of Debbrah Alar, NP. 08/06/2022   Patient ID: Barbara Flores, female    DOB: [birth year], 62 y.o.   MRN: [id number]  Chief Complaint  Patient presents with   Dizziness    Complains of dizziness since Sunday, on and off      Patient is in today for an office visit.  Dizziness: She complains of head movement making her dizzy intermittently. She feels dizzy when laying down. This started on Sunday following some GI symptoms.   Blood sugar: She does not check her blood sugar regularly.  Lab Results  Component Value Date   HGBA1C 9.4 (A) 04/25/2022    Blood pressure: Her blood pressure is elevated during this visit. BP Readings from Last 3 Encounters:  08/07/22 (!) 152/78  06/25/22 114/70  06/10/22 (!) 140/72    Pulse Readings from Last 3 Encounters:  08/06/22 (!) 102  06/25/22 78  06/10/22 93    Past Medical History:  Diagnosis Date   Allergy    Asthma    Diabetes mellitus 08/23/2010   GERD (gastroesophageal reflux disease)    History of exercise stress test 07-2010   normal except for elevated BP   Hyperlipidemia    Hypertension    Menopause    Rosacea     Past Surgical History:  Procedure Laterality Date   COLONOSCOPY     Eardrum surgery     HEMORRHOID SURGERY     NASAL SINUS SURGERY     OTHER SURGICAL HISTORY     female surgery after miscarriage, ? D&C or edometrial surgery   UPPER GASTROINTESTINAL ENDOSCOPY      Family History  Problem Relation Age of Onset   Diabetes Mother    Hyperlipidemia Mother    Hypertension Mother    Stroke Mother 28   Kidney failure Mother    Stomach cancer Father        deceased   Breast cancer Sister 11   Heart attack Brother 34   Colon cancer Neg Hx    Esophageal cancer Neg Hx    Rectal cancer Neg Hx     Social History   Socioeconomic History   Marital status:  Married    Spouse name: Not on file   Number of children: 2   Years of education: Not on file   Highest education level: Not on file  Occupational History   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  Tobacco Use   Smoking status: Never   Smokeless tobacco: Never  Vaping Use   Vaping Use: Never used  Substance and Sexual Activity   Alcohol use: Yes    Comment: rarely   Drug use: No   Sexual activity: Not on file  Other Topics Concern   Not on file  Social History Narrative   Household --pt, husband    2 children-- son 12, daughter 61     Social Determinants of Radio broadcast assistant Strain: Not on file  Food Insecurity: Not on file  Transportation Needs: Not on file  Physical Activity: Not on file  Stress: Not on file  Social Connections: Not on file  Intimate Partner Violence: Not on file    Outpatient Medications Prior to Visit  Medication Sig Dispense Refill   albuterol (PROVENTIL) (2.5 MG/3ML) 0.083% nebulizer solution Take 3 mLs (2.5  mg total) by nebulization every 6 (six) hours as needed for wheezing or shortness of breath. 150 mL 1   albuterol (VENTOLIN HFA) 108 (90 Base) MCG/ACT inhaler INHALE 2 PUFFS BY MOUTH EVERY 6 HOURS AS NEEDED FOR WHEEZING OR SHORTNESS OF BREATH 9 g 0   aspirin 81 MG tablet Take 81 mg by mouth daily.     FINACEA 15 % cream      fluticasone (FLONASE) 50 MCG/ACT nasal spray Place 2 sprays into both nostrils daily. 16 g 6   Fluticasone Propionate, Inhal, (FLOVENT DISKUS) 100 MCG/BLIST AEPB Take 2 puffs twice daily during flares. Rinse your mouth out after use. 60 each 2   JARDIANCE 25 MG TABS tablet Take 25 mg by mouth every morning.     losartan-hydrochlorothiazide (HYZAAR) 100-25 MG tablet Take 1 tablet by mouth daily. 90 tablet 1   nitroGLYCERIN (NITRODUR - DOSED IN MG/24 HR) 0.2 mg/hr patch Cut and apply 1/4 patch to most painful area q24h. 30 patch 11   pantoprazole (PROTONIX) 40 MG tablet Use protonix  (pantoprazole) 40 mg twice a day for 3 months followed by 40 mg once daily thereafter 90 tablet 3   rosuvastatin (CRESTOR) 20 MG tablet TAKE 1 TABLET BY MOUTH AT BEDTIME 90 tablet 1   sitaGLIPtin (JANUVIA) 100 MG tablet Take 1 tablet by mouth once daily 90 tablet 1   glipiZIDE (GLUCOTROL) 5 MG tablet Take 1 tablet (5 mg total) by mouth daily before breakfast. (Patient not taking: Reported on 06/25/2022) 90 tablet 1   azelastine (ASTELIN) 0.1 % nasal spray Place 2 sprays into both nostrils at bedtime as needed for rhinitis. Use in each nostril as directed 30 mL 3   levocetirizine (XYZAL) 5 MG tablet Take 1 tablet (5 mg total) by mouth every evening. 30 tablet 3   meloxicam (MOBIC) 15 MG tablet Take 1 tablet (15 mg total) by mouth daily as needed. (Patient not taking: Reported on 06/10/2022) 45 tablet 1   pioglitazone (ACTOS) 30 MG tablet Take 1 tablet (30 mg total) by mouth daily. (Patient not taking: Reported on 06/25/2022) 90 tablet 1   Semaglutide (RYBELSUS) 7 MG TABS Take 7 mg by mouth daily. (Patient not taking: Reported on 06/25/2022) 90 tablet 3   No facility-administered medications prior to visit.    Allergies  Allergen Reactions   Crab [Shellfish Allergy]    Hydrocodone Nausea Only   Penicillins     vomitting   Amoxicillin Rash    Review of Systems  Neurological:  Positive for dizziness.       Objective:    Physical Exam Constitutional:      General: She is not in acute distress.    Appearance: Normal appearance.  HENT:     Head: Normocephalic and atraumatic.     Right Ear: Tympanic membrane, ear canal and external ear normal.     Left Ear: Tympanic membrane and ear canal normal.     Ears:     Comments: (+) scarring in left ear Positive dix hallpike left, Mildly positive on the right      Mouth/Throat:     Mouth: Mucous membranes are moist.  Eyes:     Extraocular Movements: Extraocular movements intact.     Right eye: No nystagmus.     Left eye: No nystagmus.      Pupils: Pupils are equal, round, and reactive to light.  Cardiovascular:     Rate and Rhythm: Normal rate and regular rhythm.  Heart sounds: Normal heart sounds. No murmur heard.    No gallop.     Comments: Blood pressure measured 152/78 during manual recheck Pulmonary:     Effort: Pulmonary effort is normal. No respiratory distress.     Breath sounds: Normal breath sounds. No wheezing or rales.  Musculoskeletal:     Cervical back: Normal range of motion.  Lymphadenopathy:     Cervical: No cervical adenopathy.  Skin:    General: Skin is warm.  Neurological:     Mental Status: She is alert.     BP (!) 152/78   Pulse (!) 102   Temp 97.9 F (36.6 C) (Oral)   Resp 16   Wt 155 lb (70.3 kg)   LMP 03/08/2016   SpO2 98%   BMI 31.31 kg/m  Wt Readings from Last 3 Encounters:  08/06/22 155 lb (70.3 kg)  06/25/22 156 lb (70.8 kg)  06/10/22 158 lb 12.8 oz (72 kg)       Assessment & Plan:  Gastroenteritis Assessment & Plan: Clinically improving.   Orders: -     Comprehensive metabolic panel -     CBC with Differential/Platelet  Type 2 diabetes mellitus without complication, without long-term current use of insulin (HCC) Assessment & Plan: Lab Results  Component Value Date   HGBA1C 9.4 (A) 04/25/2022   Last A1C was quite high.  She does not check her sugars and does not have a glucometer. I sent an rx for glucometer and supplies and encouraged her to monitor her glucose at home. She has follow up with her endocrinologist in April.   Orders: -     Blood Glucose Monitoring Suppl; 1 each by Does not apply route in the morning, at noon, and at bedtime. May substitute to any manufacturer covered by patient's insurance.  Dispense: 1 each; Refill: 0 -     Blood Glucose Test; 1 each by In Vitro route in the morning, at noon, and at bedtime. May substitute to any manufacturer covered by patient's insurance.  Dispense: 100 strip; Refill: 0 -     Lancet Device; 1 each by Does not  apply route in the morning, at noon, and at bedtime. May substitute to any manufacturer covered by patient's insurance.  Dispense: 1 each; Refill: 0 -     Lancets Misc.; 1 each by Does not apply route in the morning, at noon, and at bedtime. May substitute to any manufacturer covered by patient's insurance.  Dispense: 100 each; Refill: 0  Benign paroxysmal positional vertigo due to bilateral vestibular disorder Assessment & Plan: New. Trial of meclizine prn.    Essential hypertension, benign Assessment & Plan: Above goal.  Continue losatan hctz, add amlodipine 2.11m once daily.    Other orders -     Meclizine HCl; Take 1 tablet (25 mg total) by mouth 3 (three) times daily as needed for dizziness.  Dispense: 30 tablet; Refill: 0 -     amLODIPine Besylate; Take 1 tablet (2.5 mg total) by mouth daily.  Dispense: 90 tablet; Refill: 0    I, MNance Pear NP, personally preformed the services described in this documentation.  All medical record entries made by the scribe were at my direction and in my presence.  I have reviewed the chart and discharge instructions (if applicable) and agree that the record reflects my personal performance and is accurate and complete. 08/06/2022    ILacretia Leighas a scribe for MNance Pear NP.,have documented all relevant documentation on  the behalf of Nance Pear, NP,as directed by  Nance Pear, NP while in the presence of Nance Pear, NP.  Nance Pear, NP

## 2022-08-07 DIAGNOSIS — K529 Noninfective gastroenteritis and colitis, unspecified: Secondary | ICD-10-CM | POA: Insufficient documentation

## 2022-08-07 DIAGNOSIS — H8113 Benign paroxysmal vertigo, bilateral: Secondary | ICD-10-CM | POA: Insufficient documentation

## 2022-08-07 LAB — COMPREHENSIVE METABOLIC PANEL
ALT: 30 U/L (ref 0–35)
AST: 24 U/L (ref 0–37)
Albumin: 4.9 g/dL (ref 3.5–5.2)
Alkaline Phosphatase: 96 U/L (ref 39–117)
BUN: 15 mg/dL (ref 6–23)
CO2: 25 mEq/L (ref 19–32)
Calcium: 10.1 mg/dL (ref 8.4–10.5)
Chloride: 101 mEq/L (ref 96–112)
Creatinine, Ser: 0.78 mg/dL (ref 0.40–1.20)
GFR: 83.11 mL/min (ref >60.00)
Glucose, Bld: 204 mg/dL — ABNORMAL HIGH (ref 70–99)
Potassium: 3.7 mEq/L (ref 3.5–5.1)
Sodium: 140 mEq/L (ref 135–145)
Total Bilirubin: 0.4 mg/dL (ref 0.2–1.2)
Total Protein: 7.8 g/dL (ref 6.0–8.3)

## 2022-08-07 NOTE — Assessment & Plan Note (Signed)
Above goal.  Continue losatan hctz, add amlodipine 2.32m once daily.

## 2022-08-07 NOTE — Assessment & Plan Note (Signed)
Clinically improving.

## 2022-08-07 NOTE — Assessment & Plan Note (Signed)
Lab Results  Component Value Date   HGBA1C 9.4 (A) 04/25/2022   Last A1C was quite high.  She does not check her sugars and does not have a glucometer. I sent an rx for glucometer and supplies and encouraged her to monitor her glucose at home. She has follow up with her endocrinologist in April.

## 2022-08-07 NOTE — Assessment & Plan Note (Signed)
New. Trial of meclizine prn.

## 2022-08-10 ENCOUNTER — Other Ambulatory Visit: Payer: Self-pay | Admitting: Internal Medicine

## 2022-09-03 ENCOUNTER — Other Ambulatory Visit: Payer: Self-pay | Admitting: Family

## 2022-09-03 DIAGNOSIS — E119 Type 2 diabetes mellitus without complications: Secondary | ICD-10-CM

## 2022-09-03 MED ORDER — BLOOD GLUCOSE TEST VI STRP: ORAL_STRIP | 12 refills | Status: DC

## 2022-09-05 ENCOUNTER — Ambulatory Visit: Payer: BC Managed Care – PPO | Admitting: Internal Medicine

## 2022-09-18 NOTE — Progress Notes (Unsigned)
Name: Barbara Flores  MRN/ DOB: [id number], [birth year]   Age/ Sex: 62 y.o., female    PCP: Wanda Plump, MD   Reason for Endocrinology Evaluation: Type 2 Diabetes Mellitus     Date of Initial Endocrinology Visit: 04/25/2022    PATIENT IDENTIFIER: Barbara Flores is a 62 y.o. female with a past medical history of HTN, DM and dyslipidemia. The patient presented for initial endocrinology clinic visit on 04/25/2022 for consultative assistance with her diabetes management.    HPI: Barbara Flores was    Diagnosed with DM 2018 Prior Medications tried/Intolerance: Metformin - GI side effects            Hemoglobin A1c has ranged from 6.5% in 2021, peaking at 9.2% in 2022.   On her initial visit with me she had an A1c of 9.4%, she was on Januvia, pioglitazone, SGLT2 inhibitors were cost prohibitive and Rybelsus was not covered by her insurance so we opted to continue pioglitazone and Januvia and started glipizide  She was started on Jardiance 06/2022 through PCP  She had to stop pioglitazone due to insurance issue , stopped 06/2022  SUBJECTIVE:   During the last visit (04/25/2022): A1c 9.4%    Today (09/19/22): Barbara Flores is here for follow-up on diabetes management, she checks her blood sugars 1 times daily. The patient has not had hypoglycemic episodes since the last clinic visit.   Denies nausea, vomiting or diarrhea  She has noted left flank pain, no fever, she has chronic frequency but not hematuria   HOME DIABETES REGIMEN: Actos 30 mg daily- not taking  Januvia 100 mg daily Glipizide 5 mg daily Jardiance 25 mg daily      Statin: Yes ACE-I/ARB: Yes Prior Diabetic Education: no   METER DOWNLOAD SUMMARY: Did not bring  This am 147 mg/dL    DIABETIC COMPLICATIONS: Microvascular complications:   Denies: CKD,  Last eye exam: Completed 07/18/2022  Macrovascular complications:   Denies: CAD, PVD, CVA   PAST HISTORY: Past Medical History:  Past Medical History:   Diagnosis Date   Allergy    Asthma    Diabetes mellitus 08/23/2010   GERD (gastroesophageal reflux disease)    History of exercise stress test 07-2010   normal except for elevated BP   Hyperlipidemia    Hypertension    Menopause    Rosacea    Past Surgical History:  Past Surgical History:  Procedure Laterality Date   COLONOSCOPY     Eardrum surgery     HEMORRHOID SURGERY     NASAL SINUS SURGERY     OTHER SURGICAL HISTORY     female surgery after miscarriage, ? D&C or edometrial surgery   UPPER GASTROINTESTINAL ENDOSCOPY      Social History:  reports that she has never smoked. She has never used smokeless tobacco. She reports current alcohol use. She reports that she does not use drugs. Family History:  Family History  Problem Relation Age of Onset   Diabetes Mother    Hyperlipidemia Mother    Hypertension Mother    Stroke Mother 62   Kidney failure Mother    Stomach cancer Father        deceased   Breast cancer Sister 7   Heart attack Brother 58   Colon cancer Neg Hx    Esophageal cancer Neg Hx    Rectal cancer Neg Hx      HOME MEDICATIONS: Allergies as of 09/19/2022       Reactions   Crab [  shellfish Allergy]    Hydrocodone Nausea Only   Penicillins    vomitting   Amoxicillin Rash        Medication List        Accurate as of September 19, 2022 10:19 AM. If you have any questions, ask your nurse or doctor.          albuterol (2.5 MG/3ML) 0.083% nebulizer solution Commonly known as: PROVENTIL Take 3 mLs (2.5 mg total) by nebulization every 6 (six) hours as needed for wheezing or shortness of breath.   albuterol 108 (90 Base) MCG/ACT inhaler Commonly known as: VENTOLIN HFA INHALE 2 PUFFS BY MOUTH EVERY 6 HOURS AS NEEDED FOR WHEEZING OR SHORTNESS OF BREATH   amLODipine 2.5 MG tablet Commonly known as: NORVASC Take 1 tablet (2.5 mg total) by mouth daily.   aspirin 81 MG tablet Take 81 mg by mouth daily.   Blood Glucose Monitoring Suppl Devi 1 each  by Does not apply route in the morning, at noon, and at bedtime. May substitute to any manufacturer covered by patient's insurance.   BLOOD GLUCOSE TEST STRIPS Strp Check blood sugars three times daily   Finacea 15 % gel Generic drug: Azelaic Acid   Flovent Diskus 100 MCG/ACT Aepb Generic drug: Fluticasone Propionate (Inhal) Take 2 puffs twice daily during flares. Rinse your mouth out after use.   fluticasone 50 MCG/ACT nasal spray Commonly known as: FLONASE Place 2 sprays into both nostrils daily.   glipiZIDE 5 MG tablet Commonly known as: GLUCOTROL Take 1 tablet (5 mg total) by mouth daily before breakfast.   Januvia 100 MG tablet Generic drug: sitaGLIPtin Take 1 tablet by mouth once daily   Jardiance 25 MG Tabs tablet Generic drug: empagliflozin Take 25 mg by mouth every morning.   losartan-hydrochlorothiazide 100-25 MG tablet Commonly known as: HYZAAR Take 1 tablet by mouth daily.   meclizine 25 MG tablet Commonly known as: ANTIVERT Take 1 tablet (25 mg total) by mouth 3 (three) times daily as needed for dizziness.   nitroGLYCERIN 0.2 mg/hr patch Commonly known as: NITRODUR - Dosed in mg/24 hr Cut and apply 1/4 patch to most painful area q24h.   pantoprazole 40 MG tablet Commonly known as: PROTONIX Use protonix (pantoprazole) 40 mg twice a day for 3 months followed by 40 mg once daily thereafter   rosuvastatin 20 MG tablet Commonly known as: CRESTOR Take 1 tablet (20 mg total) by mouth at bedtime.         ALLERGIES: Allergies  Allergen Reactions   Crab [Shellfish Allergy]    Hydrocodone Nausea Only   Penicillins     vomitting   Amoxicillin Rash     REVIEW OF SYSTEMS: A comprehensive ROS was conducted with the patient and is negative except as per HPI and below:      OBJECTIVE:   VITAL SIGNS: BP (!) 172/96   Pulse (!) 102   Ht  (1.499 m)   Wt 153 lb (69.4 kg)   LMP 03/08/2016   SpO2 98%   BMI 30.90 kg/m    PHYSICAL EXAM:   General: Pt appears well and is in NAD  Neck: General: Supple without adenopathy or carotid bruits. Thyroid: Thyroid size normal.  No goiter or nodules appreciated.   Lungs: Clear with good BS bilat with no rales, rhonchi, or wheezes  Heart: RRR   Abdomen:  soft, nontender  Extremities:  Lower extremities - No pretibial edema. No lesions.  Neuro: MS is good with appropriate affect,  pt is alert and Ox3    DM foot exam: 04/25/2022  The skin of the feet is intact without sores or ulcerations. The pedal pulses are 2+ on right and 2+ on left. The sensation is intact to a screening 5.07, 10 gram monofilament bilaterally   DATA REVIEWED:  Lab Results  Component Value Date   HGBA1C 7.5 (A) 09/19/2022   HGBA1C 9.4 (A) 04/25/2022   HGBA1C 8.0 (H) 11/15/2021    Latest Reference Range & Units 08/06/22 11:42  Sodium 135 - 145 mEq/L 140  Potassium 3.5 - 5.1 mEq/L 3.7  Chloride 96 - 112 mEq/L 101  CO2 19 - 32 mEq/L 25  Glucose 70 - 99 mg/dL 409 (H)  BUN 6 - 23 mg/dL 15  Creatinine 8.11 - 9.14 mg/dL 7.82  Calcium 8.4 - 95.6 mg/dL 21.3  Alkaline Phosphatase 39 - 117 U/L 96  Albumin 3.5 - 5.2 g/dL 4.9  AST 0 - 37 U/L 24  ALT 0 - 35 U/L 30  Total Protein 6.0 - 8.3 g/dL 7.8  Total Bilirubin 0.2 - 1.2 mg/dL 0.4  GFR >08.65 mL/min 83.11  (H): Data is abnormally high   ASSESSMENT / PLAN / RECOMMENDATIONS:   1) Type 2 Diabetes Mellitus, Sub-Optimally  controlled, Without complications - Most recent A1c of 9.4 %. Goal A1c < 7.0 %.      - A1c down from 9.4% to 7.5%  -She has self discontinued pioglitazone due to insurance issues 06/2022 -Today we discussed switching Januvia to Rybelsus, she was given a #30 sample of Rybelsus 3 mg tablets -We will stop glipizide -She will continue Jardiance  MEDICATIONS: Stop glipizide Stop Januvia Start Rybelsus 3 mg daily for a month, then increase to 7 mg daily Continue Jardiance 25 mg daily   EDUCATION / INSTRUCTIONS: BG monitoring  instructions: Patient is instructed to check her blood sugars 2-3 times a week. Call [facility] if: BG persistently < 70  I reviewed the Rule of 15 for the treatment of hypoglycemia in detail with the patient. Literature supplied.   2) Diabetic complications:  Eye: Does not have known diabetic retinopathy.  Neuro/ Feet: Does not have known diabetic peripheral neuropathy. Renal: Patient does not have known baseline CKD.     3) Left Flank Pain:  -Could be musculoskeletal versus UTI -UA****  Follow-up in 4 months  Signed electronically by: Lyndle Herrlich, MD  [facility]  [facility] [address] [place] [place], [place] [address] Phone: [phone] FAX: [phone]   CC: Wanda Plump, MD [address] [place] [address] Phone: [phone]  Fax: [phone]    Return to Endocrinology clinic as below: Future Appointments  Date Time Provider Department Center  11/28/2022  8:20 AM Wanda Plump, MD [id number]

## 2022-09-19 ENCOUNTER — Encounter: Payer: Self-pay | Admitting: Internal Medicine

## 2022-09-19 ENCOUNTER — Ambulatory Visit (INDEPENDENT_AMBULATORY_CARE_PROVIDER_SITE_OTHER): Payer: BC Managed Care – PPO | Admitting: Internal Medicine

## 2022-09-19 VITALS — BP 172/96 | HR 102 | Ht 59.0 in | Wt 153.0 lb

## 2022-09-19 DIAGNOSIS — R109 Unspecified abdominal pain: Secondary | ICD-10-CM | POA: Diagnosis not present

## 2022-09-19 DIAGNOSIS — E1165 Type 2 diabetes mellitus with hyperglycemia: Secondary | ICD-10-CM

## 2022-09-19 LAB — POCT GLYCOSYLATED HEMOGLOBIN (HGB A1C): Hemoglobin A1C: 7.5 % — AB (ref 4.0–5.6)

## 2022-09-19 MED ORDER — RYBELSUS 7 MG PO TABS: 7.0000 mg | ORAL_TABLET | Freq: Every day | ORAL | 3 refills | Status: DC

## 2022-09-19 MED ORDER — EMPAGLIFLOZIN 25 MG PO TABS: 25.0000 mg | ORAL_TABLET | Freq: Every morning | ORAL | 3 refills | Status: DC

## 2022-09-19 NOTE — Patient Instructions (Addendum)
Stop Glipizide  Stop Januvia  Start Rybelsus 3 mg , 1 tablet daily for 30 days than increase to 7 mg daily  Continue Jardiance 25 mg daily      HOW TO TREAT LOW BLOOD SUGARS (Blood sugar LESS THAN 70 MG/DL) Please follow the RULE OF 15 for the treatment of hypoglycemia treatment (when your (blood sugars are less than 70 mg/dL)   STEP 1: Take 15 grams of carbohydrates when your blood sugar is low, which includes:  3-4 GLUCOSE TABS  OR 3-4 OZ OF JUICE OR REGULAR SODA OR ONE TUBE OF GLUCOSE GEL    STEP 2: RECHECK blood sugar in 15 MINUTES STEP 3: If your blood sugar is still low at the 15 minute recheck --> then, go back to STEP 1 and treat AGAIN with another 15 grams of carbohydrates.

## 2022-09-20 LAB — URINALYSIS W MICROSCOPIC + REFLEX CULTURE
Bacteria, UA: NONE SEEN /HPF
Bilirubin Urine: NEGATIVE
Hgb urine dipstick: NEGATIVE
Hyaline Cast: NONE SEEN /LPF
Ketones, ur: NEGATIVE
Leukocyte Esterase: NEGATIVE
Nitrites, Initial: NEGATIVE
Protein, ur: NEGATIVE
RBC / HPF: NONE SEEN /HPF (ref 0–2)
Specific Gravity, Urine: 1.033 (ref 1.001–1.035)
WBC, UA: NONE SEEN /HPF (ref 0–5)
pH: 7 (ref 5.0–8.0)

## 2022-09-20 LAB — NO CULTURE INDICATED

## 2022-09-22 ENCOUNTER — Encounter: Payer: Self-pay | Admitting: Family Medicine

## 2022-09-22 ENCOUNTER — Encounter: Payer: Self-pay | Admitting: Internal Medicine

## 2022-09-22 ENCOUNTER — Other Ambulatory Visit (HOSPITAL_BASED_OUTPATIENT_CLINIC_OR_DEPARTMENT_OTHER): Payer: Self-pay

## 2022-09-22 ENCOUNTER — Ambulatory Visit (INDEPENDENT_AMBULATORY_CARE_PROVIDER_SITE_OTHER): Payer: BC Managed Care – PPO | Admitting: Family Medicine

## 2022-09-22 VITALS — BP 160/86 | HR 90 | Temp 98.0°F | Resp 16 | Ht 59.0 in | Wt 154.6 lb

## 2022-09-22 DIAGNOSIS — M545 Low back pain, unspecified: Secondary | ICD-10-CM | POA: Diagnosis not present

## 2022-09-22 MED ORDER — TIZANIDINE HCL 4 MG PO TABS
4.0000 mg | ORAL_TABLET | Freq: Four times a day (QID) | ORAL | 0 refills | Status: DC | PRN
Start: 2022-09-22 — End: 2024-03-14
  Filled 2022-09-22: qty 30, 8d supply, fill #0

## 2022-09-22 NOTE — Progress Notes (Signed)
Musculoskeletal Exam  Patient: Barbara Flores DOB: 1960/08/21  DOS: 09/22/2022  SUBJECTIVE:  Chief Complaint:   Chief Complaint  Patient presents with   Back Pain    Back Pain    Barbara Flores is a 62 y.o.  female for evaluation and treatment of back pain.   Onset:  6 days ago.  No obvious trigger/trauma.  Location: lower left Character:  sharp  Progression of issue:  is unchanged Associated symptoms: no bruising, redness, swelling Denies bowel/bladder incontinence or weakness Treatment: to date has been OTC NSAIDS.   Neurovascular symptoms: no She had a largely unremarkable urinalysis done last week with her endocrinologist.  Past Medical History:  Diagnosis Date   Allergy    Asthma    Diabetes mellitus 08/23/2010   GERD (gastroesophageal reflux disease)    History of exercise stress test 07-2010   normal except for elevated BP   Hyperlipidemia    Hypertension    Menopause    Rosacea     Objective:  VITAL SIGNS: BP (!) 160/86 (BP Location: Right Arm, Patient Position: Sitting, Cuff Size: Normal)   Pulse 90   Temp 98 F (36.7 C) (Oral)   Resp 16   Ht 4\' 11"  (1.499 m)   Wt 154 lb 9.6 oz (70.1 kg)   LMP 03/08/2016   SpO2 99%   BMI 31.23 kg/m  Constitutional: Well formed, well developed. No acute distress. HENT: Normocephalic, atraumatic.  Thorax & Lungs:  No accessory muscle use Musculoskeletal: low back.   Tenderness to palpation: yes over L lumbar parasp msc Deformity: no Ecchymosis: no Straight leg test: negative for Poor hamstring flexibility b/l. Neurologic: Normal sensory function. No focal deficits noted. DTR's equal and symmetric in LE's. No clonus.  Gait is normal. Psychiatric: Normal mood. Age appropriate judgment and insight. Alert & oriented x 3.    Assessment:  Acute left-sided low back pain without sciatica - Plan: tiZANidine (ZANAFLEX) 4 MG tablet  Plan: Stretches/exercises, heat, ice, Tylenol, NSAIDs.  Nonsedating muscle relaxer as above.   Recommended she take around 90-120 minutes before bedtime to see if makes her drowsy.  If no improvement in neck several weeks, we will set her up with physical therapy. Will see what her blood pressure does at her follow-up appointment. F/u prn. The patient voiced understanding and agreement to the plan.   Jilda Roche [place], DO 09/22/22  3:49 PM

## 2022-09-22 NOTE — Patient Instructions (Addendum)

## 2022-09-24 IMAGING — MG DIGITAL SCREENING BILAT W/ CAD
4 series · 4 of 4 positions shown · non-contrast
Comparison: Previous exam(s).

CLINICAL DATA: Screening.

EXAM:
DIGITAL SCREENING BILATERAL MAMMOGRAM WITH CAD
TECHNIQUE: Bilateral screening digital craniocaudal and mediolateral oblique
mammograms were obtained. The images were evaluated with
computer-aided detection.

[R CC]
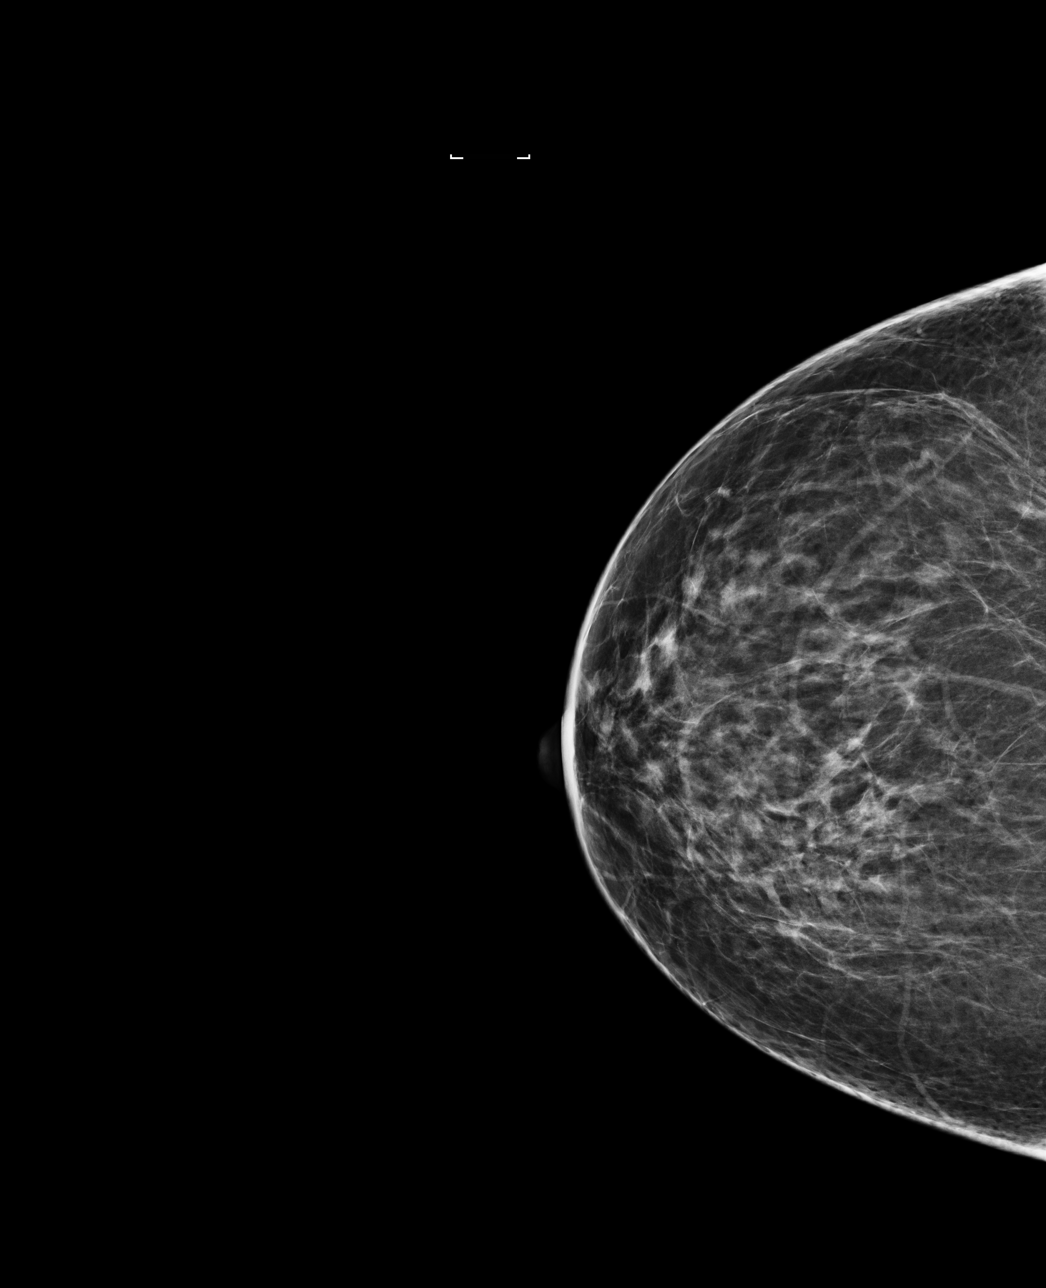

[L MLO]
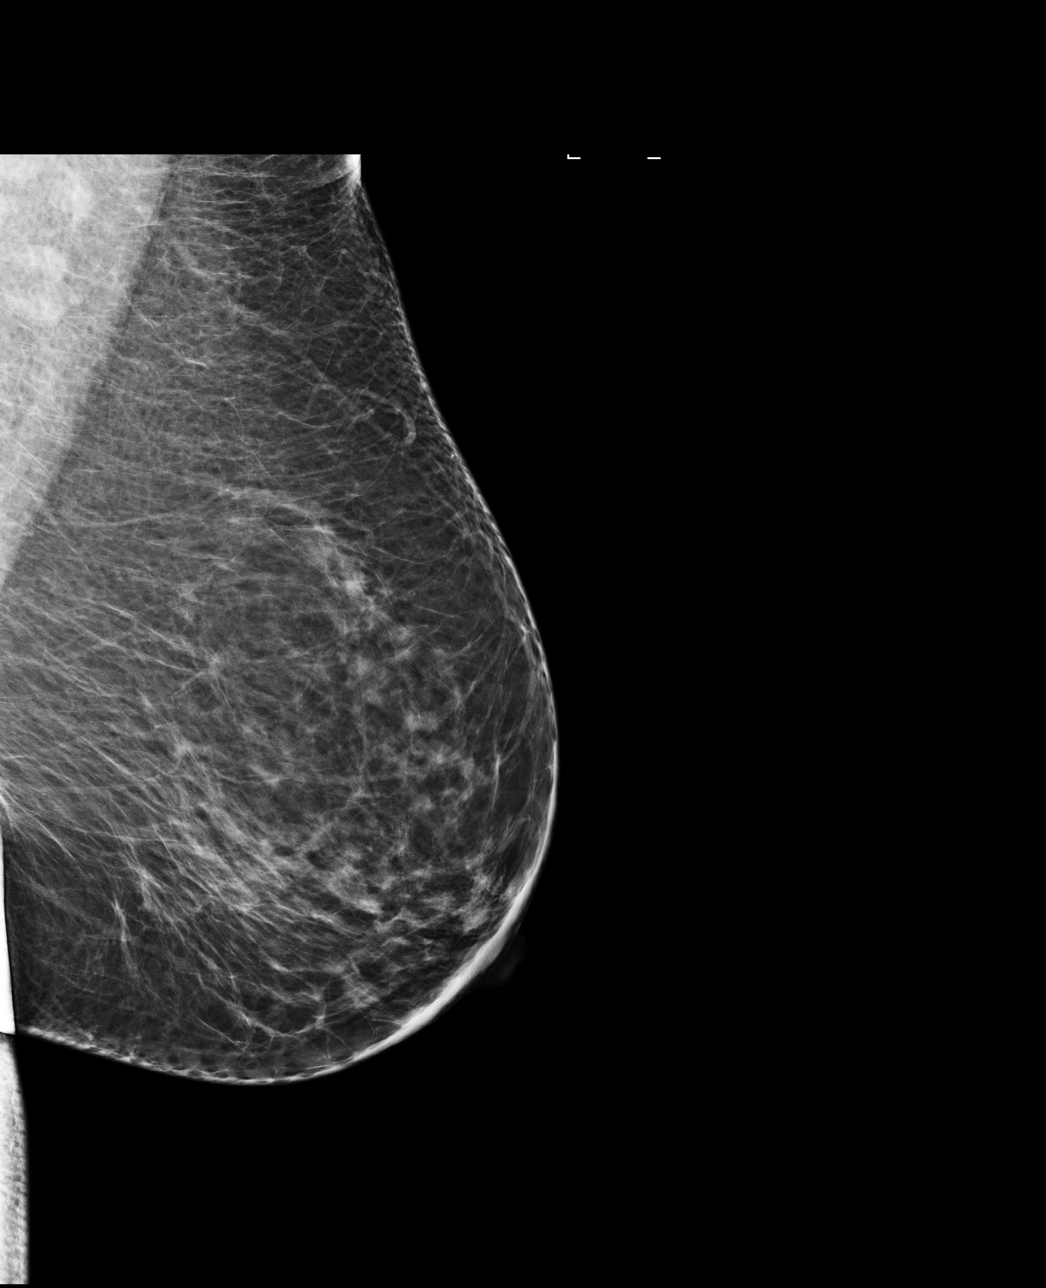

[L CC]
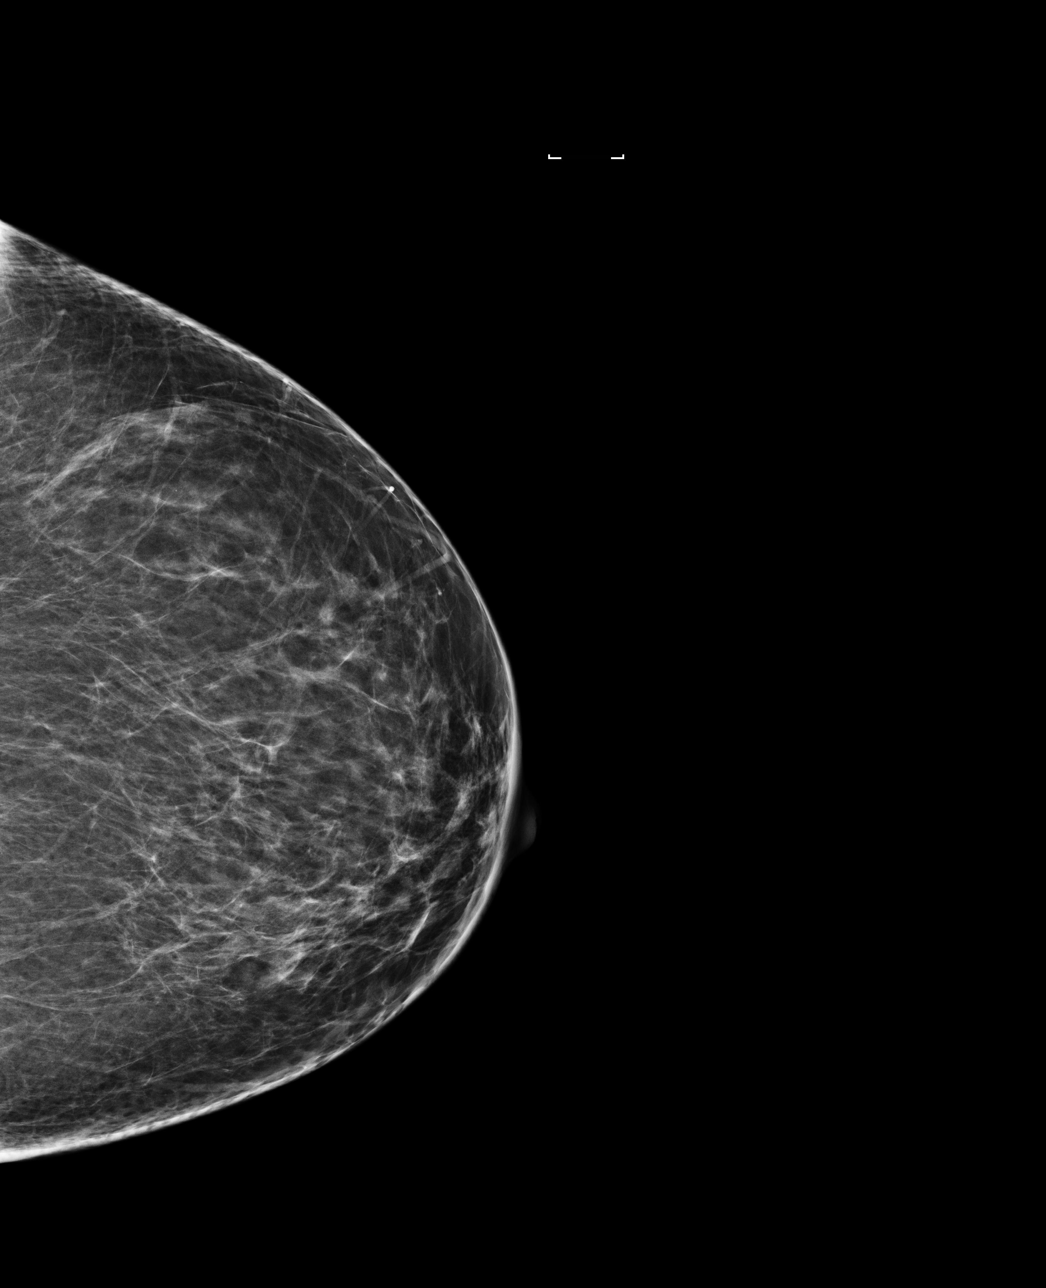

[R MLO]
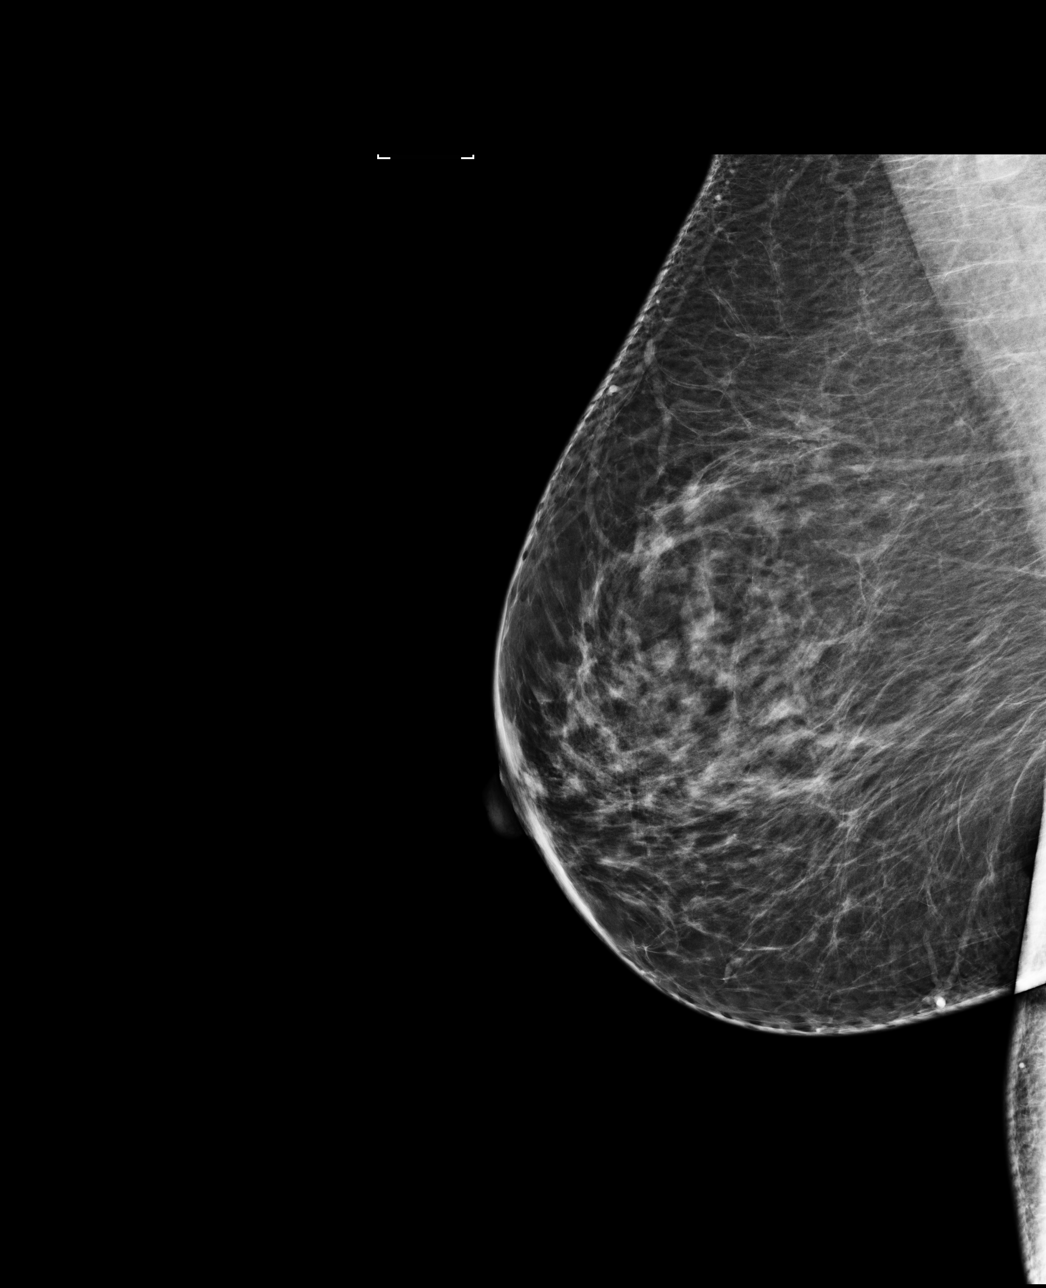

[4 of 4 positions shown; findings below may reference images not displayed]

ACR Breast Density Category b: There are scattered areas of
fibroglandular density.
FINDINGS: There are no findings suspicious for malignancy.
IMPRESSION: No mammographic evidence of malignancy. A result letter of this
screening mammogram will be mailed directly to the patient.

RECOMMENDATION:
Screening mammogram in one year. (Code:[id number]-V-ZRK)

BI-RADS CATEGORY  1: Negative.

## 2022-09-25 ENCOUNTER — Ambulatory Visit (INDEPENDENT_AMBULATORY_CARE_PROVIDER_SITE_OTHER): Payer: BC Managed Care – PPO | Admitting: Family Medicine

## 2022-09-25 ENCOUNTER — Other Ambulatory Visit (HOSPITAL_BASED_OUTPATIENT_CLINIC_OR_DEPARTMENT_OTHER): Payer: Self-pay

## 2022-09-25 ENCOUNTER — Encounter: Payer: Self-pay | Admitting: Family Medicine

## 2022-09-25 VITALS — BP 128/80 | Ht 59.0 in | Wt 154.0 lb

## 2022-09-25 DIAGNOSIS — S2341XA Sprain of ribs, initial encounter: Secondary | ICD-10-CM | POA: Insufficient documentation

## 2022-09-25 MED ORDER — MELOXICAM 15 MG PO TABS
15.0000 mg | ORAL_TABLET | Freq: Every day | ORAL | 1 refills | Status: DC | PRN
  Filled 2022-09-25: qty 60, 60d supply, fill #0

## 2022-09-25 NOTE — Patient Instructions (Signed)
Good to see you Please use heat  Please try the compression  Please try the exercises   Please send me a message in MyChart with any questions or updates.  Please see me back in 4 weeks.   --Dr. Jordan Likes

## 2022-09-25 NOTE — Progress Notes (Signed)
  [name] - 62 y.o. female MRN [id number]  Date of birth: [birth year]  SUBJECTIVE:  Including CC & ROS.  No chief complaint on file.   Barbara Flores is a 62 y.o. female that is presenting with acute left lower flank and low back pain.  The pain has been ongoing for about a week.  No specific injury inciting event.  No history of similar pain.  It is worse with sudden movements.  No history of surgery.   Review of Systems See HPI   HISTORY: Past Medical, Surgical, Social, and Family History Reviewed & Updated per EMR.   Pertinent Historical Findings include:  Past Medical History:  Diagnosis Date   Allergy    Asthma    Diabetes mellitus 08/23/2010   GERD (gastroesophageal reflux disease)    History of exercise stress test 07-2010   normal except for elevated BP   Hyperlipidemia    Hypertension    Menopause    Rosacea     Past Surgical History:  Procedure Laterality Date   COLONOSCOPY     Eardrum surgery     HEMORRHOID SURGERY     NASAL SINUS SURGERY     OTHER SURGICAL HISTORY     female surgery after miscarriage, ? D&C or edometrial surgery   UPPER GASTROINTESTINAL ENDOSCOPY       PHYSICAL EXAM:  VS: BP 128/80 (BP Location: Left Arm, Patient Position: Sitting)   Ht 4\' 11"  (1.499 m)   Wt 154 lb (69.9 kg)   LMP 03/08/2016   BMI 31.10 kg/m  Physical Exam Gen: NAD, alert, cooperative with exam, well-appearing MSK:  Neurovascularly intact       ASSESSMENT & PLAN:   Sprain of ribs Acutely occurring.  May have a radicular component versus irritation of the lumbar spine.  It does seem to correlate with the ribs and the left flank region. -Counseled on home exercise therapy and supportive care. -Provided compression and counseled on compression -Meloxicam. -Could consider physical therapy or further imaging

## 2022-09-25 NOTE — Assessment & Plan Note (Signed)
Acutely occurring.  May have a radicular component versus irritation of the lumbar spine.  It does seem to correlate with the ribs and the left flank region. -Counseled on home exercise therapy and supportive care. -Provided compression and counseled on compression -Meloxicam. -Could consider physical therapy or further imaging

## 2022-09-29 ENCOUNTER — Encounter: Payer: Self-pay | Admitting: *Deleted

## 2022-10-17 ENCOUNTER — Ambulatory Visit (INDEPENDENT_AMBULATORY_CARE_PROVIDER_SITE_OTHER): Payer: BC Managed Care – PPO | Admitting: Family Medicine

## 2022-10-17 ENCOUNTER — Encounter: Payer: Self-pay | Admitting: Family Medicine

## 2022-10-17 VITALS — BP 150/70 | Ht 59.0 in | Wt 154.0 lb

## 2022-10-17 DIAGNOSIS — S2341XD Sprain of ribs, subsequent encounter: Secondary | ICD-10-CM | POA: Diagnosis not present

## 2022-10-17 NOTE — Assessment & Plan Note (Signed)
Doing well with modalities put in place.  Pain is mild and intermittent. -Counseled on home exercise therapy and supportive care. - Could consider physical therapy

## 2022-10-17 NOTE — Progress Notes (Signed)
  Barbara Flores - 62 y.o. female MRN [id number]  Date of birth: 11/03/1960  SUBJECTIVE:  Including CC & ROS.  No chief complaint on file.   Barbara Flores is a 62 y.o. female that is following up for her rib pain.  She is doing well with the medications and exercises.  The pain has decreased.  She only feels it in certain positions.    Review of Systems See HPI   HISTORY: Past Medical, Surgical, Social, and Family History Reviewed & Updated per EMR.   Pertinent Historical Findings include:  Past Medical History:  Diagnosis Date   Allergy    Asthma    Diabetes mellitus 08/23/2010   GERD (gastroesophageal reflux disease)    History of exercise stress test 07-2010   normal except for elevated BP   Hyperlipidemia    Hypertension    Menopause    Rosacea     Past Surgical History:  Procedure Laterality Date   COLONOSCOPY     Eardrum surgery     HEMORRHOID SURGERY     NASAL SINUS SURGERY     OTHER SURGICAL HISTORY     female surgery after miscarriage, ? D&C or edometrial surgery   UPPER GASTROINTESTINAL ENDOSCOPY       PHYSICAL EXAM:  VS: BP (!) 150/70 (BP Location: Left Arm, Patient Position: Sitting)   Ht 4\' 11"  (1.499 m)   Wt 154 lb (69.9 kg)   LMP 03/08/2016   BMI 31.10 kg/m  Physical Exam Gen: NAD, alert, cooperative with exam, well-appearing MSK:  Neurovascularly intact       ASSESSMENT & PLAN:   Sprain of ribs Doing well with modalities put in place.  Pain is mild and intermittent. -Counseled on home exercise therapy and supportive care. - Could consider physical therapy

## 2022-10-27 ENCOUNTER — Encounter: Payer: Self-pay | Admitting: Internal Medicine

## 2022-10-27 ENCOUNTER — Ambulatory Visit (INDEPENDENT_AMBULATORY_CARE_PROVIDER_SITE_OTHER): Payer: BC Managed Care – PPO | Admitting: Internal Medicine

## 2022-10-27 ENCOUNTER — Telehealth: Payer: Self-pay

## 2022-10-27 ENCOUNTER — Other Ambulatory Visit (HOSPITAL_COMMUNITY): Payer: Self-pay

## 2022-10-27 VITALS — BP 126/82 | HR 70 | Ht 59.0 in | Wt 152.0 lb

## 2022-10-27 DIAGNOSIS — Z7985 Long-term (current) use of injectable non-insulin antidiabetic drugs: Secondary | ICD-10-CM | POA: Diagnosis not present

## 2022-10-27 DIAGNOSIS — Z7984 Long term (current) use of oral hypoglycemic drugs: Secondary | ICD-10-CM | POA: Diagnosis not present

## 2022-10-27 DIAGNOSIS — E1165 Type 2 diabetes mellitus with hyperglycemia: Secondary | ICD-10-CM

## 2022-10-27 DIAGNOSIS — R519 Headache, unspecified: Secondary | ICD-10-CM

## 2022-10-27 LAB — POCT GLUCOSE (DEVICE FOR HOME USE): POC Glucose: 211 mg/dl — AB (ref 70–99)

## 2022-10-27 MED ORDER — SEMAGLUTIDE (1 MG/DOSE) 4 MG/3ML ~~LOC~~ SOPN: 1.0000 mg | PEN_INJECTOR | SUBCUTANEOUS | 3 refills | Status: DC

## 2022-10-27 NOTE — Telephone Encounter (Signed)
Patient Advocate Encounter   Received notification from [facility] that prior authorization is required for [employer] 1MG /0.5ML auto-injectors  Submitted: 10/27/22 Key [id number]  Status is pending

## 2022-10-27 NOTE — Patient Instructions (Signed)
Switch Rybelsus to Ozempic 1 mg once weekly   Continue Jardiance 25 mg daily      HOW TO TREAT LOW BLOOD SUGARS (Blood sugar LESS THAN 70 MG/DL) Please follow the RULE OF 15 for the treatment of hypoglycemia treatment (when your (blood sugars are less than 70 mg/dL)   STEP 1: Take 15 grams of carbohydrates when your blood sugar is low, which includes:  3-4 GLUCOSE TABS  OR 3-4 OZ OF JUICE OR REGULAR SODA OR ONE TUBE OF GLUCOSE GEL    STEP 2: RECHECK blood sugar in 15 MINUTES STEP 3: If your blood sugar is still low at the 15 minute recheck --> then, go back to STEP 1 and treat AGAIN with another 15 grams of carbohydrates.

## 2022-10-27 NOTE — Progress Notes (Signed)
Name: Barbara Flores  MRN/ DOB: [id number], 11/23/1960   Age/ Sex: 62 y.o., female    PCP: Wanda Plump, MD   Reason for Endocrinology Evaluation: Type 2 Diabetes Mellitus     Date of Initial Endocrinology Visit: 04/25/2022    PATIENT IDENTIFIER: Barbara Flores is a 62 y.o. female with a past medical history of HTN, DM and dyslipidemia. The patient presented for initial endocrinology clinic visit on 04/25/2022 for consultative assistance with her diabetes management.    HPI: Barbara Flores was    Diagnosed with DM 2018 Prior Medications tried/Intolerance: Metformin - GI side effects            Hemoglobin A1c has ranged from 6.5% in 2021, peaking at 9.2% in 2022.   On her initial visit with me she had an A1c of 9.4%, she was on Januvia, pioglitazone, SGLT2 inhibitors were cost prohibitive and Rybelsus was not covered by her insurance so we opted to continue pioglitazone and Januvia and started glipizide  She was started on Jardiance 06/2022 through PCP  She had to stop pioglitazone due to insurance issue , stopped 06/2022   Switch Rybelsus to Ozempic, per her request as she attributed headaches to Rybelsus 10/2022  SUBJECTIVE:   During the last visit (09/19/2022): A1c 9.4%    Today (10/27/22): [name] is here for follow-up on diabetes management, she checks her blood sugars 1 times daily. The pt is here to discuss headaches that she is not sure if related to Rybelsus or Jardiance.    Headaches started a week ago , the headaches are frontal and around the sinus  She would like to switch to Ozempic   She took allergy medications which help   Denies nausea, vomiting or diarrhea     HOME DIABETES REGIMEN: Rybelsus 7 mg daily  Jardiance 25 mg daily      Statin: Yes ACE-I/ARB: Yes Prior Diabetic Education: no   METER DOWNLOAD SUMMARY: Did not bring This Am BG 144 mg/dL   DIABETIC COMPLICATIONS: Microvascular complications:   Denies: CKD,  Last eye exam: Completed  07/18/2022  Macrovascular complications:   Denies: CAD, PVD, CVA   PAST HISTORY: Past Medical History:  Past Medical History:  Diagnosis Date   Allergy    Asthma    Diabetes mellitus 08/23/2010   GERD (gastroesophageal reflux disease)    History of exercise stress test 07-2010   normal except for elevated BP   Hyperlipidemia    Hypertension    Menopause    Rosacea    Past Surgical History:  Past Surgical History:  Procedure Laterality Date   COLONOSCOPY     Eardrum surgery     HEMORRHOID SURGERY     NASAL SINUS SURGERY     OTHER SURGICAL HISTORY     female surgery after miscarriage, ? D&C or edometrial surgery   UPPER GASTROINTESTINAL ENDOSCOPY      Social History:  reports that she has never smoked. She has never used smokeless tobacco. She reports current alcohol use. She reports that she does not use drugs. Family History:  Family History  Problem Relation Age of Onset   Diabetes Mother    Hyperlipidemia Mother    Hypertension Mother    Stroke Mother 36   Kidney failure Mother    Stomach cancer Father        deceased   Breast cancer Sister 9   Heart attack Brother 67   Colon cancer Neg Hx    Esophageal cancer  Neg Hx    Rectal cancer Neg Hx      HOME MEDICATIONS: Allergies as of 10/27/2022       Reactions   Crab [shellfish Allergy]    Hydrocodone Nausea Only   Penicillins    vomitting   Amoxicillin Rash        Medication List        Accurate as of Oct 27, 2022 10:49 AM. If you have any questions, ask your nurse or doctor.          albuterol (2.5 MG/3ML) 0.083% nebulizer solution Commonly known as: PROVENTIL Take 3 mLs (2.5 mg total) by nebulization every 6 (six) hours as needed for wheezing or shortness of breath.   albuterol 108 (90 Base) MCG/ACT inhaler Commonly known as: VENTOLIN HFA INHALE 2 PUFFS BY MOUTH EVERY 6 HOURS AS NEEDED FOR WHEEZING OR SHORTNESS OF BREATH   amLODipine 2.5 MG tablet Commonly known as: NORVASC Take 1 tablet  (2.5 mg total) by mouth daily.   aspirin 81 MG tablet Take 81 mg by mouth daily.   Blood Glucose Monitoring Suppl Devi 1 each by Does not apply route in the morning, at noon, and at bedtime. May substitute to any manufacturer covered by patient's insurance.   BLOOD GLUCOSE TEST STRIPS Strp Check blood sugars three times daily   empagliflozin 25 MG Tabs tablet Commonly known as: Jardiance Take 1 tablet (25 mg total) by mouth every morning.   Finacea 15 % gel Generic drug: Azelaic Acid   Flovent Diskus 100 MCG/ACT Aepb Generic drug: Fluticasone Propionate (Inhal) Take 2 puffs twice daily during flares. Rinse your mouth out after use.   fluticasone 50 MCG/ACT nasal spray Commonly known as: FLONASE Place 2 sprays into both nostrils daily.   losartan-hydrochlorothiazide 100-25 MG tablet Commonly known as: HYZAAR Take 1 tablet by mouth daily.   meclizine 25 MG tablet Commonly known as: ANTIVERT Take 1 tablet (25 mg total) by mouth 3 (three) times daily as needed for dizziness.   meloxicam 15 MG tablet Commonly known as: MOBIC Take 1 tablet (15 mg total) by mouth daily as needed.   nitroGLYCERIN 0.2 mg/hr patch Commonly known as: NITRODUR - Dosed in mg/24 hr Cut and apply 1/4 patch to most painful area q24h.   pantoprazole 40 MG tablet Commonly known as: PROTONIX Use protonix (pantoprazole) 40 mg twice a day for 3 months followed by 40 mg once daily thereafter   rosuvastatin 20 MG tablet Commonly known as: CRESTOR Take 1 tablet (20 mg total) by mouth at bedtime.   Rybelsus 7 MG Tabs Generic drug: Semaglutide Take 1 tablet (7 mg total) by mouth daily.   tiZANidine 4 MG tablet Commonly known as: Zanaflex Take 1 tablet (4 mg total) by mouth every 6 (six) hours as needed for muscle spasms.         ALLERGIES: Allergies  Allergen Reactions   Crab [Shellfish Allergy]    Hydrocodone Nausea Only   Penicillins     vomitting   Amoxicillin Rash     REVIEW OF  SYSTEMS: A comprehensive ROS was conducted with the patient and is negative except as per HPI and below:      OBJECTIVE:   VITAL SIGNS: BP 126/82 (BP Location: Left Arm, Patient Position: Sitting, Cuff Size: Large)   Pulse 70   Ht 4\' 11"  (1.499 m)   Wt 152 lb (68.9 kg)   LMP 03/08/2016   SpO2 99%   BMI 30.70 kg/m    PHYSICAL  EXAM:  General: Pt appears well and is in NAD  Neck: General: Supple without adenopathy or carotid bruits. Thyroid: Thyroid size normal.  No goiter or nodules appreciated.   Lungs: Clear with good BS bilat with no rales, rhonchi, or wheezes  Heart: RRR   Abdomen:  soft, nontender  Extremities:  Lower extremities - No pretibial edema. No lesions.  Neuro: MS is good with appropriate affect, pt is alert and Ox3    DM foot exam: 04/25/2022  The skin of the feet is intact without sores or ulcerations. The pedal pulses are 2+ on right and 2+ on left. The sensation is intact to a screening 5.07, 10 gram monofilament bilaterally   DATA REVIEWED:  Lab Results  Component Value Date   HGBA1C 7.5 (A) 09/19/2022   HGBA1C 9.4 (A) 04/25/2022   HGBA1C 8.0 (H) 11/15/2021    Latest Reference Range & Units 08/06/22 11:42  Sodium 135 - 145 mEq/L 140  Potassium 3.5 - 5.1 mEq/L 3.7  Chloride 96 - 112 mEq/L 101  CO2 19 - 32 mEq/L 25  Glucose 70 - 99 mg/dL 540 (H)  BUN 6 - 23 mg/dL 15  Creatinine 9.81 - 1.91 mg/dL 4.78  Calcium 8.4 - 29.5 mg/dL 62.1  Alkaline Phosphatase 39 - 117 U/L 96  Albumin 3.5 - 5.2 g/dL 4.9  AST 0 - 37 U/L 24  ALT 0 - 35 U/L 30  Total Protein 6.0 - 8.3 g/dL 7.8  Total Bilirubin 0.2 - 1.2 mg/dL 0.4  GFR >30.86 mL/min 83.11    She ate a corndog and did not take Rybelsus   In Office BG 211 mg/dL     ASSESSMENT / PLAN / RECOMMENDATIONS:   1) Type 2 Diabetes Mellitus, Sub-Optimally  controlled, Without complications - Most recent A1c of 7.5 %. Goal A1c < 7.0 %.     -Patient attributes headaches to Rybelsus and would like to  change to Ozempic -Her fasting BG 144 mg/DL, she ate corn dog and in office BG 211 mg/DL -She has self discontinued pioglitazone due to insurance issues 06/2022 MEDICATIONS:  Stop Rybelsus 3 mg Start Ozempic 1 mg weekly Continue Jardiance 25 mg daily   EDUCATION / INSTRUCTIONS: BG monitoring instructions: Patient is instructed to check her blood sugars 2-3 times a week. Call [facility] if: BG persistently < 70  I reviewed the Rule of 15 for the treatment of hypoglycemia in detail with the patient. Literature supplied.   2) Diabetic complications:  Eye: Does not have known diabetic retinopathy.  Neuro/ Feet: Does not have known diabetic peripheral neuropathy. Renal: Patient does not have known baseline CKD.    3) Headaches:  -I suspect these are allergic in nature, as they are relieved with taking antihistamine -Patient advised to take antihistamine for the next 2 weeks -She was advised to avoid decongestants    Signed electronically by: Lyndle Herrlich, MD  [facility]  [facility] [address] [place] [address] [place] [place], [place] [address] Phone: [phone] FAX: [phone]   CC: Wanda Plump, MD [address] [place] [address] Phone: [phone]  Fax: [phone]    Return to Endocrinology clinic as below: Future Appointments  Date Time Provider Department Center  10/27/2022 10:50 AM Jenayah Antu, Konrad Dolores, MD [facility] None  11/28/2022  8:20 AM Wanda Plump, MD [facility]  04/03/2023  7:30 AM Makaela Cando, Konrad Dolores, MD [facility] None

## 2022-10-28 ENCOUNTER — Other Ambulatory Visit: Payer: Self-pay | Admitting: Internal Medicine

## 2022-10-31 NOTE — Telephone Encounter (Signed)
Pharmacy Patient Advocate Encounter  Prior Authorization has been approved  Effective dates: 10/28/22 through 03/03/23

## 2022-11-10 ENCOUNTER — Other Ambulatory Visit: Payer: Self-pay | Admitting: Internal Medicine

## 2022-11-12 ENCOUNTER — Telehealth: Payer: Self-pay | Admitting: Internal Medicine

## 2022-11-12 NOTE — Telephone Encounter (Signed)
Patient advised and verbalized understanding 

## 2022-11-12 NOTE — Telephone Encounter (Signed)
Patient is calling to say that she went to [facility] to pick up her prescription for Ozempic and pharmacist told her that the 2.5 mg was a high dose for starting Ozempic.  Patient states that she asked the possible side affects and said she was told by the pharmacist nausea and stomach upset among other things.  Patient is calling to to ask for a lower dosage for Ozempic stating that she has a very sensitive stomach.

## 2022-11-26 DIAGNOSIS — H04123 Dry eye syndrome of bilateral lacrimal glands: Secondary | ICD-10-CM | POA: Diagnosis not present

## 2022-11-28 ENCOUNTER — Ambulatory Visit (INDEPENDENT_AMBULATORY_CARE_PROVIDER_SITE_OTHER): Payer: BC Managed Care – PPO | Admitting: Internal Medicine

## 2022-11-28 ENCOUNTER — Encounter: Payer: Self-pay | Admitting: Internal Medicine

## 2022-11-28 VITALS — BP 126/80 | HR 97 | Temp 97.8°F | Resp 16 | Ht 59.0 in | Wt 150.5 lb

## 2022-11-28 DIAGNOSIS — J4521 Mild intermittent asthma with (acute) exacerbation: Secondary | ICD-10-CM

## 2022-11-28 DIAGNOSIS — Z7984 Long term (current) use of oral hypoglycemic drugs: Secondary | ICD-10-CM

## 2022-11-28 DIAGNOSIS — Z7985 Long-term (current) use of injectable non-insulin antidiabetic drugs: Secondary | ICD-10-CM

## 2022-11-28 DIAGNOSIS — R0982 Postnasal drip: Secondary | ICD-10-CM | POA: Diagnosis not present

## 2022-11-28 DIAGNOSIS — E119 Type 2 diabetes mellitus without complications: Secondary | ICD-10-CM

## 2022-11-28 DIAGNOSIS — Z0000 Encounter for general adult medical examination without abnormal findings: Secondary | ICD-10-CM | POA: Diagnosis not present

## 2022-11-28 DIAGNOSIS — Z780 Asymptomatic menopausal state: Secondary | ICD-10-CM

## 2022-11-28 LAB — MICROALBUMIN / CREATININE URINE RATIO
Creatinine,U: 22.9 mg/dL
Microalb Creat Ratio: 5.3 mg/g (ref 0.0–30.0)
Microalb, Ur: 1.2 mg/dL (ref 0.0–1.9)

## 2022-11-28 LAB — BASIC METABOLIC PANEL
BUN: 14 mg/dL (ref 6–23)
CO2: 22 mEq/L (ref 19–32)
Calcium: 9.8 mg/dL (ref 8.4–10.5)
Chloride: 102 mEq/L (ref 96–112)
Creatinine, Ser: 0.79 mg/dL (ref 0.40–1.20)
GFR: 81.67 mL/min (ref >60.00)
Glucose, Bld: 113 mg/dL — ABNORMAL HIGH (ref 70–99)
Potassium: 3.9 mEq/L (ref 3.5–5.1)
Sodium: 137 mEq/L (ref 135–145)

## 2022-11-28 LAB — VITAMIN D 25 HYDROXY (VIT D DEFICIENCY, FRACTURES): VITD: 12.81 ng/mL — ABNORMAL LOW (ref 30.00–100.00)

## 2022-11-28 MED ORDER — ALBUTEROL SULFATE HFA 108 (90 BASE) MCG/ACT IN AERS
2.0000 | INHALATION_SPRAY | Freq: Four times a day (QID) | RESPIRATORY_TRACT | 5 refills | Status: DC | PRN
Start: 2022-11-28 — End: 2023-03-06

## 2022-11-28 MED ORDER — FLUTICASONE PROPIONATE 50 MCG/ACT NA SUSP
2.0000 | Freq: Every day | NASAL | 5 refills | Status: DC
Start: 2022-11-28 — End: 2023-03-06

## 2022-11-28 NOTE — Progress Notes (Unsigned)
Subjective:    Patient ID: Barbara Flores, female    DOB: 05/12/1961, 62 y.o.   MRN: [id number]  DOS:  11/28/2022 Type of visit - description: CPX  Here for CPX. Reports she is doing well. Has no concerns or major symptoms. Wt Readings from Last 3 Encounters:  11/28/22 150 lb 8 oz (68.3 kg)  10/27/22 152 lb (68.9 kg)  10/17/22 154 lb (69.9 kg)     Review of Systems See above   Past Medical History:  Diagnosis Date   Allergy    Asthma    Diabetes mellitus 08/23/2010   GERD (gastroesophageal reflux disease)    History of exercise stress test 07-2010   normal except for elevated BP   Hyperlipidemia    Hypertension    Menopause    Rosacea     Past Surgical History:  Procedure Laterality Date   COLONOSCOPY     Eardrum surgery     HEMORRHOID SURGERY     NASAL SINUS SURGERY     OTHER SURGICAL HISTORY     female surgery after miscarriage, ? D&C or edometrial surgery   UPPER GASTROINTESTINAL ENDOSCOPY      Current Outpatient Medications  Medication Instructions   albuterol (PROVENTIL) 2.5 mg, Nebulization, Every 6 hours PRN   albuterol (VENTOLIN HFA) 108 (90 Base) MCG/ACT inhaler INHALE 2 PUFFS BY MOUTH EVERY 6 HOURS AS NEEDED FOR WHEEZING OR SHORTNESS OF BREATH   amLODipine (NORVASC) 2.5 mg, Oral, Daily   aspirin 81 mg, Oral, Daily,     Blood Glucose Monitoring Suppl DEVI 1 each, Does not apply, 3 times daily, May substitute to any manufacturer covered by patient's insurance.   empagliflozin (JARDIANCE) 25 mg, Oral, Every morning   FINACEA 15 % cream No dose, route, or frequency recorded.   fluticasone (FLONASE) 50 MCG/ACT nasal spray 2 sprays, Each Nare, Daily   Fluticasone Propionate, Inhal, (FLOVENT DISKUS) 100 MCG/BLIST AEPB Take 2 puffs twice daily during flares. Rinse your mouth out after use.   Glucose Blood (BLOOD GLUCOSE TEST STRIPS) STRP Check blood sugars three times daily   losartan-hydrochlorothiazide (HYZAAR) 100-25 MG tablet 1 tablet, Oral, Daily    meclizine (ANTIVERT) 25 mg, Oral, 3 times daily PRN   meloxicam (MOBIC) 15 mg, Oral, Daily PRN   nitroGLYCERIN (NITRODUR - DOSED IN MG/24 HR) 0.2 mg/hr patch Cut and apply 1/4 patch to most painful area q24h.   pantoprazole (PROTONIX) 40 MG tablet Use protonix (pantoprazole) 40 mg twice a day for 3 months followed by 40 mg once daily thereafter   rosuvastatin (CRESTOR) 20 mg, Oral, Daily at bedtime   Semaglutide (1 MG/DOSE) 1 mg, Injection, Weekly   tiZANidine (ZANAFLEX) 4 mg, Oral, Every 6 hours PRN       Objective:   Physical Exam BP 126/80   Pulse 97   Temp 97.8 F (36.6 C) (Oral)   Resp 16   Ht 4\' 11"  (1.499 m)   Wt 150 lb 8 oz (68.3 kg)   LMP 03/08/2016   SpO2 97%   BMI 30.40 kg/m  General: Well developed, NAD, BMI noted Neck: No  thyromegaly  HEENT:  Normocephalic . Face symmetric, atraumatic Lungs:  CTA B Normal respiratory effort, no intercostal retractions, no accessory muscle use. Heart: RRR,  no murmur.  Abdomen:  Not distended, soft, non-tender. No rebound or rigidity.   Lower extremities: no pretibial edema bilaterally  Skin: Exposed areas without rash. Not pale. Not jaundice Neurologic:  alert & oriented X3.  Speech  normal, gait appropriate for age and unassisted Strength symmetric and appropriate for age.  Psych: Cognition and judgment appear intact.  Cooperative with normal attention span and concentration.  Behavior appropriate. No anxious or depressed appearing.     Assessment     Assessment  Diabetes 2012 (Metformin intolerant 10/2019) HTN Dyslipidemia (intol to atorvastatin per patient.  Crestor DC'd 2015 due to cost) asthma Menopause at age 57 Rosacea GI: --RUQ pain: Ultrasound and HIDA scan negative (02-2015, 03-2015) -- 07/2016--  H Pylori  (+) , partially treated biaxin/flagel, poor tolerance  --constipation  Stress test 2012 neg , elevated BP +FH CAD brother MI 57 y/o  PLAN Here for CPX - Td 2022 - pnm shot 2015, prevnar 2016 -   s/p shingrix x 2 and hep immunization done  @ [facility] per pt - Vaccines I advise: COVID booster,   flu shot (fall) -CCS:  Colonoscopy 07-2015 (-), + IFOB 12/2021 : Had a EGD and colonoscopy 06-2022, next per GI. -Female care per gynecologist    sees gyn regulalrly, PAP 2022;  MMG 11/2021 ([facility])  -Lifestyle: Doing very well, eating less sugars, controlling portions. -Bone health: Menopause at age 48, recommend to start vitamin D 2000 units daily and check a DEXA. - Labs reviewed, check a BMP - Healthcare POA: Information provided DM: Per Endo.  On Jardiance and semaglutide, + weight loss, improving her diet. HTN: Reports normal ambulatory BPs.  No change Dyslipidemia: Last LDL 70.  No change Asthma: On Flovent and albuterol as needed only. Requested refills, sent. RTC 6 months   ======  DM: Saw Endo 04/25/2022, A1c 9.4.  Recommend good compliance with medication, diet and exercise. HTN: Currently on Hyzaar, upon arrival BP was slightly elevated, recheck: 140/72.  At home she gets similar readings (140s). No change, check BMP. Dyslipidemia: On rosuvastatin, check FLP. + Hemoccult: Saw GI 05/12/2022, + fecal Hemoccult,  Rx a EGD and colonoscopy.  Considering also a capsule endoscopy.  Procedures pending. Preventive care:  Plans to get a flu shot @ pharmacy  Covid booster rec   had a MMG 11/2021, to see gyn next week RTC CPX 6 months

## 2022-11-28 NOTE — Patient Instructions (Addendum)
Vaccines I recommend: COVID booster Flu shot this fall  Proceed with a bone density test.  You can schedule it on the first floor  Start vitamin D over-the-counter: 2000 units every day  Check the  blood pressure regularly BP GOAL is between 110/65 and  135/85. If it is consistently higher or lower, let me know     GO TO THE LAB : Get the blood work     GO TO THE FRONT DESK, PLEASE SCHEDULE YOUR APPOINTMENTS Come back for   checkup in 6 months     "Health Care Power of attorney" ,  "Living will" (Advance care planning documents)  If you already have a living will or healthcare power of attorney, is recommended you bring the copy to be scanned in your chart.   The document will be available to all the doctors you see in the system.  Advance care planning is a process that supports adults in  understanding and sharing their preferences regarding future medical care.  The patient's preferences are recorded in documents called Advance Directives and the can be modified at any time while the patient is in full mental capacity.   If you don't have one, please consider create one.      More information at: [email or web address]

## 2022-11-29 ENCOUNTER — Encounter: Payer: Self-pay | Admitting: Internal Medicine

## 2022-11-29 NOTE — Assessment & Plan Note (Signed)
-   Td 2022 - pnm shot 2015, prevnar 2016 -  s/p shingrix x 2 and hep immunization done  @ [facility] per pt - Vaccines I advise: COVID booster,   flu shot (fall) -CCS:  Colonoscopy 07-2015 (-), + IFOB 12/2021 : Had a EGD and colonoscopy 06-2022, next per GI. -Female care per gynecologist    sees gyn regulalrly, PAP 2022;  MMG 11/2021 ([facility])  -Lifestyle: Doing very well, eating less sugars, controlling portions. -Bone health: Menopause at age 62, recommend to start vitamin D 2000 units daily and check a DEXA. - Labs reviewed, check a BMP - Healthcare POA: Information provided

## 2022-11-29 NOTE — Assessment & Plan Note (Signed)
Here for CPX DM: Per Endo.  On Jardiance and semaglutide, + weight loss, improving her diet. HTN: Reports normal ambulatory BPs.  No change Dyslipidemia: Last LDL 70.  No change Asthma: On Flovent and albuterol as needed only. Requested refills, sent. RTC 6 months

## 2022-12-01 ENCOUNTER — Other Ambulatory Visit (HOSPITAL_BASED_OUTPATIENT_CLINIC_OR_DEPARTMENT_OTHER): Payer: BC Managed Care – PPO

## 2022-12-02 MED ORDER — VITAMIN D (ERGOCALCIFEROL) 1.25 MG (50000 UNIT) PO CAPS: 50000.0000 [IU] | ORAL_CAPSULE | ORAL | 0 refills | Status: AC

## 2022-12-02 NOTE — Addendum Note (Signed)
Addended byConrad Laurel D on: 12/02/2022 01:04 PM   Modules accepted: Orders

## 2022-12-10 ENCOUNTER — Telehealth: Payer: Self-pay

## 2022-12-10 NOTE — Telephone Encounter (Signed)
Patient would likea rx for test strips that she doesn't have to pay for. Stated the strips she had to buy were $60. Please advise

## 2022-12-11 NOTE — Telephone Encounter (Signed)
Sent patient a mychart message with information.

## 2022-12-26 DIAGNOSIS — H04123 Dry eye syndrome of bilateral lacrimal glands: Secondary | ICD-10-CM | POA: Diagnosis not present

## 2023-01-03 ENCOUNTER — Other Ambulatory Visit: Payer: Self-pay | Admitting: Family

## 2023-01-23 DIAGNOSIS — H04123 Dry eye syndrome of bilateral lacrimal glands: Secondary | ICD-10-CM | POA: Diagnosis not present

## 2023-01-23 DIAGNOSIS — Z1231 Encounter for screening mammogram for malignant neoplasm of breast: Secondary | ICD-10-CM | POA: Diagnosis not present

## 2023-01-26 ENCOUNTER — Encounter: Payer: Self-pay | Admitting: Internal Medicine

## 2023-02-05 DIAGNOSIS — L718 Other rosacea: Secondary | ICD-10-CM | POA: Diagnosis not present

## 2023-03-01 ENCOUNTER — Other Ambulatory Visit: Payer: Self-pay | Admitting: Internal Medicine

## 2023-03-06 ENCOUNTER — Other Ambulatory Visit: Payer: Self-pay

## 2023-03-06 ENCOUNTER — Ambulatory Visit (INDEPENDENT_AMBULATORY_CARE_PROVIDER_SITE_OTHER): Payer: BC Managed Care – PPO | Admitting: Internal Medicine

## 2023-03-06 ENCOUNTER — Encounter: Payer: Self-pay | Admitting: Internal Medicine

## 2023-03-06 ENCOUNTER — Telehealth: Payer: Self-pay

## 2023-03-06 VITALS — BP 168/88 | HR 94 | Temp 98.7°F | Ht 59.0 in | Wt 148.4 lb

## 2023-03-06 DIAGNOSIS — J301 Allergic rhinitis due to pollen: Secondary | ICD-10-CM | POA: Diagnosis not present

## 2023-03-06 DIAGNOSIS — T781XXD Other adverse food reactions, not elsewhere classified, subsequent encounter: Secondary | ICD-10-CM

## 2023-03-06 DIAGNOSIS — J4520 Mild intermittent asthma, uncomplicated: Secondary | ICD-10-CM

## 2023-03-06 MED ORDER — ALBUTEROL SULFATE HFA 108 (90 BASE) MCG/ACT IN AERS: 1.0000 | INHALATION_SPRAY | Freq: Four times a day (QID) | RESPIRATORY_TRACT | 1 refills | Status: DC | PRN

## 2023-03-06 MED ORDER — ASMANEX HFA 100 MCG/ACT IN AERO: INHALATION_SPRAY | RESPIRATORY_TRACT | 1 refills | Status: AC

## 2023-03-06 MED ORDER — FLUTICASONE PROPIONATE 50 MCG/ACT NA SUSP: 2.0000 | Freq: Every day | NASAL | 5 refills | Status: DC

## 2023-03-06 MED ORDER — CETIRIZINE HCL 10 MG PO TABS: 10.0000 mg | ORAL_TABLET | Freq: Every day | ORAL | 5 refills | Status: DC

## 2023-03-06 MED ORDER — OLOPATADINE HCL 0.2 % OP SOLN: 1.0000 [drp] | Freq: Every day | OPHTHALMIC | 5 refills | Status: AC | PRN

## 2023-03-06 MED ORDER — ALBUTEROL SULFATE (2.5 MG/3ML) 0.083% IN NEBU: 2.5000 mg | INHALATION_SOLUTION | Freq: Four times a day (QID) | RESPIRATORY_TRACT | 1 refills | Status: AC | PRN

## 2023-03-06 MED ORDER — AZELASTINE HCL 0.1 % NA SOLN: 1.0000 | Freq: Two times a day (BID) | NASAL | 5 refills | Status: DC | PRN

## 2023-03-06 NOTE — Telephone Encounter (Signed)
Call pt and inform her that I will be send her the res of her skin test results via mail pt verbalized understanding.

## 2023-03-06 NOTE — Patient Instructions (Addendum)
Mild Intermittent Asthma: - MDI technique discussed.  Normal spirometry today. With respiratory illness or flare ups, start Asmanex 1 puff twice daily for 1-2 weeks.  - Maintenance inhaler: none - Rescue inhaler: Albuterol 2 puffs via spacer or 1 vial via nebulizer every 4-6 hours as needed for respiratory symptoms of cough, shortness of breath, or wheezing Asthma control goals:  Full participation in all desired activities (may need albuterol before activity) Albuterol use two times or less a week on average (not counting use with activity) Cough interfering with sleep two times or less a month Oral steroids no more than once a year No hospitalizations   Allergic Rhinitis: - Positive skin test 02/2023: trees, grasses, weeds  - Avoidance measures discussed. - Use nasal saline rinses before nose sprays such as with Neilmed Sinus Rinse.  Use distilled water.   - Use Flonase 2 sprays each nostril daily. Aim upward and outward. - Use Azelastine 1-2 sprays each nostril twice daily as needed for runny nose, drainage, sneezing, congestion. Aim upward and outward. - Use Zyrtec 10 mg daily.  - For eyes, use Olopatadine or Ketotifen 1 eye drop daily as needed for itchy, watery eyes.  Available over the counter, if not covered by insurance.  - Consider allergy shots as long term control of your symptoms by teaching your immune system to be more tolerant of your allergy triggers   Food allergy:  - Initial rxn: mouth itching with seafood but now eats it sometimes because of craving and does not have any issues.  - today's skin testing was negative to shellfish and fish.  - okay to resume eating all seafood.    ALLERGEN AVOIDANCE MEASURES   Pollen Avoidance Pollen levels are highest during the mid-day and afternoon.  Consider this when planning outdoor activities. Avoid being outside when the grass is being mowed, or wear a mask if the pollen-allergic person must be the one to mow the  grass. Keep the windows closed to keep pollen outside of the home. Use an air conditioner to filter the air. Take a shower, wash hair, and change clothing after working or playing outdoors during pollen season.

## 2023-03-06 NOTE — Progress Notes (Signed)
NEW PATIENT  Date of Service/Encounter:  03/06/23  Consult requested by: Wanda Plump, MD   Subjective:   Barbara Flores (DOB: [birth year]) is a 62 y.o. female who presents to the clinic on 03/06/2023 with a chief complaint of Allergies .    History obtained from: chart review and patient. Discussed the use of AI scribe software for clinical note transcription with the patient, who gave verbal consent to proceed.    The patient, a 62 year old with a history of asthma and allergies, presents for an allergy test. She was diagnosed with asthma in the 15s, around the age of 89, after experiencing difficulty breathing. The symptoms were initially unrecognized due to language barriers. The asthma was managed with albuterol and Flovent inhalers, but the patient reports only using the albuterol inhaler about once a week when experiencing shortness of breath. She does not use the Flovent inhaler. In the past year, she has not required emergency care or oral steroids for asthma.  The patient also has a history of food allergies, specifically to seafood, diagnosed in 1996. She occasionally consumes seafood, experiencing only mild throat itching.  She now eats seafood when she craves it a lot but for most parts tries to avoid it; no reactions when she has eaten it a few times.  She also reports seasonal allergies, particularly in the spring, with symptoms of stuffy nose, runny nose, and itchy eyes and nose. These symptoms are managed with Claritin as needed and Flonase nasal spray about three times a week. She has been allergy tested in the past, long time ago but can't recall exact results.       Past Medical History: Past Medical History:  Diagnosis Date   Allergy    Asthma    Diabetes mellitus 08/23/2010   GERD (gastroesophageal reflux disease)    History of exercise stress test 07-2010   normal except for elevated BP   Hyperlipidemia    Hypertension    Menopause    Rosacea     Past Surgical  History: Past Surgical History:  Procedure Laterality Date   COLONOSCOPY     Eardrum surgery     HEMORRHOID SURGERY     NASAL SINUS SURGERY     OTHER SURGICAL HISTORY     female surgery after miscarriage, ? D&C or edometrial surgery   UPPER GASTROINTESTINAL ENDOSCOPY      Family History: Family History  Problem Relation Age of Onset   Diabetes Mother    Hyperlipidemia Mother    Hypertension Mother    Stroke Mother 69   Kidney failure Mother    Stomach cancer Father        deceased   Breast cancer Sister 25   Heart attack Brother 23   Colon cancer Neg Hx    Esophageal cancer Neg Hx    Rectal cancer Neg Hx     Social History:  Flooring in bedroom: Engineer, civil (consulting) Pets: none Tobacco use/exposure: none Job: Administrator assembly  Medication List:  Allergies as of 03/06/2023       Reactions   Hydrocodone Nausea Only   Penicillins    vomitting   Amoxicillin Rash        Medication List        Accurate as of March 06, 2023 12:04 PM. If you have any questions, ask your nurse or doctor.          Accu-Chek Softclix Lancets lancets USE 1 LANCET TO CHECK BLOOD SUGAR IN THE MORNING, AT NOON  AND AT BEDTIME   albuterol (2.5 MG/3ML) 0.083% nebulizer solution Commonly known as: PROVENTIL Take 3 mLs (2.5 mg total) by nebulization every 6 (six) hours as needed for wheezing or shortness of breath. What changed: Another medication with the same name was changed. Make sure you understand how and when to take each. Changed by: Birder Robson   albuterol 108 (90 Base) MCG/ACT inhaler Commonly known as: VENTOLIN HFA Inhale 1-2 puffs into the lungs every 6 (six) hours as needed for wheezing or shortness of breath. What changed: how much to take Changed by: Ellen Henri Hyacinth Marcelli   amLODipine 2.5 MG tablet Commonly known as: NORVASC Take 1 tablet (2.5 mg total) by mouth daily.   Asmanex HFA 100 MCG/ACT Aero Generic drug: Mometasone Furoate With respiratory illness or flare ups, start  Asmanex 1 puff twice daily for 1-2 weeks. Started by: Birder Robson   aspirin 81 MG tablet Take 81 mg by mouth daily.   Azelaic Acid 15 % gel Apply topically 2 (two) times daily.   azelastine 0.1 % nasal spray Commonly known as: ASTELIN Place 1 spray into both nostrils 2 (two) times daily as needed. Use in each nostril as directed Started by: Birder Robson   Blood Glucose Monitoring Suppl Devi 1 each by Does not apply route in the morning, at noon, and at bedtime. May substitute to any manufacturer covered by patient's insurance.   BLOOD GLUCOSE TEST STRIPS Strp Check blood sugars three times daily   cetirizine 10 MG tablet Commonly known as: ZyrTEC Allergy Take 1 tablet (10 mg total) by mouth daily. Started by: Birder Robson   empagliflozin 25 MG Tabs tablet Commonly known as: Jardiance Take 1 tablet (25 mg total) by mouth every morning.   Flovent Diskus 100 MCG/BLIST Aepb Generic drug: Fluticasone Propionate (Inhal) Take 2 puffs twice daily during flares. Rinse your mouth out after use.   fluticasone 50 MCG/ACT nasal spray Commonly known as: FLONASE Place 2 sprays into both nostrils daily.   losartan-hydrochlorothiazide 100-25 MG tablet Commonly known as: HYZAAR Take 1 tablet by mouth daily.   meclizine 25 MG tablet Commonly known as: ANTIVERT Take 1 tablet (25 mg total) by mouth 3 (three) times daily as needed for dizziness.   meloxicam 15 MG tablet Commonly known as: MOBIC Take 1 tablet (15 mg total) by mouth daily as needed.   nitroGLYCERIN 0.2 mg/hr patch Commonly known as: NITRODUR - Dosed in mg/24 hr Cut and apply 1/4 patch to most painful area q24h.   Olopatadine HCl 0.2 % Soln Apply 1 drop to eye daily as needed (itchy watery eyes). Started by: Birder Robson   pantoprazole 40 MG tablet Commonly known as: PROTONIX Use protonix (pantoprazole) 40 mg twice a day for 3 months followed by 40 mg once daily thereafter   Restasis 0.05 % ophthalmic  emulsion Generic drug: cycloSPORINE Place 1 drop into both eyes 2 (two) times daily.   rosuvastatin 20 MG tablet Commonly known as: CRESTOR Take 1 tablet (20 mg total) by mouth at bedtime.   Semaglutide (1 MG/DOSE) 4 MG/3ML Sopn Inject 1 mg as directed once a week.   tiZANidine 4 MG tablet Commonly known as: Zanaflex Take 1 tablet (4 mg total) by mouth every 6 (six) hours as needed for muscle spasms.   Vitamin D 50 MCG (2000 UT) Caps Take by mouth.         REVIEW OF SYSTEMS: Pertinent positives and negatives discussed in HPI.  Objective:   Physical Exam: BP (!) 168/88   Pulse 94   Temp 98.7 F (37.1 C) (Temporal)   Ht 4\' 11"  (1.499 m)   Wt 148 lb 6.4 oz (67.3 kg)   LMP 03/08/2016   SpO2 98%   BMI 29.97 kg/m  Body mass index is 29.97 kg/m. GEN: alert, well developed HEENT: clear conjunctiva, TM grey and translucent, nose with + moderate inferior turbinate hypertrophy, boggy nasal mucosa, slight clear rhinorrhea, no cobblestoning HEART: regular rate and rhythm, no murmur LUNGS: clear to auscultation bilaterally, no coughing, unlabored respiration ABDOMEN: soft, non distended  SKIN: no rashes or lesions  Reviewed:  11/28/2022: seen by Dr Drue Novel. Has hx of asthma, on Flovent and albuterol. Refills sent. Notes exacerbation but no wheezing on exam and no complaints of SOB/wheezing/cough.   10/27/2022: seen by Endo Dr Lonzo Cloud for DM II, DLD, HTN. Started on Ozempic and continue Jardiance.   06/25/2022: seen by GI Dr Lavon Paganini for EGD+colonoscopy for positive heme occult.  EGD showed gastric body edema, erosions, erythema, linear erosions, shallow ulcerations.  Colonoscopy with polyp. Started on Protonix 40mg  BID for 3 months and then 40mg  daily afterwards.   Spirometry:  Tracings reviewed. Her effort: Good reproducible efforts. FVC: 2.25L FEV1: 1.75L, 94% predicted FEV1/FVC ratio: 78% Interpretation: Spirometry consistent with normal pattern.  Please see scanned  spirometry results for details.  Skin Testing:  Skin prick testing was placed, which includes aeroallergens/foods, histamine control, and saline control.  Verbal consent was obtained prior to placing test.  Patient tolerated procedure well.  Allergy testing results were read and interpreted by myself, documented by clinical staff. Adequate positive and negative control.  Results discussed with patient/family.  Airborne Adult Perc - 03/06/23 1000     1. Control-Buffer 50% Glycerol Negative    2. Control-Histamine 3+    3. Bahia Negative    4. French Southern Territories Negative    5. Johnson Negative    6. Kentucky Blue Negative    7. Meadow Fescue Negative    8. Perennial Rye Negative    9. Timothy Negative    10. Ragweed Mix 3+    11. Cocklebur 3+    12. Plantain,  English Negative    13. Baccharis 3+    14. Dog Fennel 2+    15. Guernsey Thistle 2+    16. Lamb's Quarters Negative    17. Sheep Sorrell Negative    18. Rough Pigweed Negative    19. Marsh Elder, Rough Negative    20. Mugwort, Common 3+    21. Box, Elder 3+    22. Cedar, red 3+    23. Sweet Gum Negative    24. Pecan Pollen 3+    25. Pine Mix Negative    26. Walnut, Black Pollen 3+    27. Red Mulberry Negative    28. Ash Mix 3+    29. Birch Mix Negative    30. Beech American Negative    31. Cottonwood, Guinea[place]issau Negative    32. Hickory, White 3+    33. Maple Mix 3+    34. Oak, [place] Mix 3+    35. Sycamore Eastern Negative    36. Alternaria Alternata Negative    37. Cladosporium Herbarum Negative    38. Aspergillus Mix Negative    39. Penicillium Mix Negative    40. Bipolaris Sorokiniana (Helminthosporium) Negative    41. Drechslera Spicifera (Curvularia) Negative    42. Mucor Plumbeus Negative    43. Fusarium Moniliforme Negative  44. Aureobasidium Pullulans (pullulara) Negative    45. Rhizopus Oryzae Negative    46. Botrytis Cinera Negative    47. Epicoccum Nigrum Negative    48. Phoma Betae Negative    49. Dust  Mite Mix Negative    50. Cat Hair 10,000 BAU/ml Negative    51.  Dog Epithelia Negative    52. Mixed Feathers Negative    53. Horse Epithelia Negative    54. Cockroach, German Negative    55. Tobacco Leaf Negative             Food Adult Perc - 03/06/23 1000     8. Shellfish Mix Negative    9. Fish Mix Negative    18. Trout Negative    19. Tuna Negative    20. Salmon Negative    21. Flounder Negative    22. Codfish Negative    23. Shrimp Negative    24. Crab Negative    25. Lobster Negative    26. Oyster Negative    27. Scallops Negative               Assessment:   1. Mild intermittent asthma without complication   2. Adverse food reaction, subsequent encounter   3. Seasonal allergic rhinitis due to pollen     Plan/Recommendations:  Mild Intermittent Asthma: - MDI technique discussed.  Normal spirometry today.  With respiratory illness or flare ups, start Asmanex 1 puff twice daily for 1-2 weeks.  Flovent is not covered.  - Maintenance inhaler: none - Rescue inhaler: Albuterol 2 puffs via spacer or 1 vial via nebulizer every 4-6 hours as needed for respiratory symptoms of cough, shortness of breath, or wheezing Asthma control goals:  Full participation in all desired activities (may need albuterol before activity) Albuterol use two times or less a week on average (not counting use with activity) Cough interfering with sleep two times or less a month Oral steroids no more than once a year No hospitalizations   Allergic Rhinitis: - Due to turbinate hypertrophy, seasonal symptoms and unresponsive to over the counter meds, performed skin testing to identify aeroallergen triggers.   - Positive skin test 02/2023: trees, grasses, weeds  - Avoidance measures discussed. - Use nasal saline rinses before nose sprays such as with Neilmed Sinus Rinse.  Use distilled water.   - Use Flonase 1-2 sprays each nostril daily. Aim upward and outward. - Use Azelastine 1-2  sprays each nostril twice daily as needed for runny nose, drainage, sneezing, congestion. Aim upward and outward. - Use Zyrtec 10 mg daily.  - For eyes, use Olopatadine or Ketotifen 1 eye drop daily as needed for itchy, watery eyes.  Available over the counter, if not covered by insurance.  - Consider allergy shots as long term control of your symptoms by teaching your immune system to be more tolerant of your allergy triggers   Food allergy:  - Initial rxn: mouth itching with seafood but now eats it sometimes because of craving it and does not have any issues.  - today's skin testing 02/2023 was negative to shellfish and fish.  - okay to resume eating all seafood.       Return in about 2 months (around 05/06/2023).  Alesia Morin, MD [facility]

## 2023-03-20 DIAGNOSIS — H04123 Dry eye syndrome of bilateral lacrimal glands: Secondary | ICD-10-CM | POA: Diagnosis not present

## 2023-04-03 ENCOUNTER — Ambulatory Visit: Payer: BC Managed Care – PPO | Admitting: Internal Medicine

## 2023-05-01 ENCOUNTER — Ambulatory Visit (INDEPENDENT_AMBULATORY_CARE_PROVIDER_SITE_OTHER): Payer: BC Managed Care – PPO | Admitting: Internal Medicine

## 2023-05-01 ENCOUNTER — Other Ambulatory Visit: Payer: Self-pay

## 2023-05-01 VITALS — BP 118/66 | HR 92 | Temp 98.0°F | Wt 148.4 lb

## 2023-05-01 DIAGNOSIS — J301 Allergic rhinitis due to pollen: Secondary | ICD-10-CM

## 2023-05-01 DIAGNOSIS — J4520 Mild intermittent asthma, uncomplicated: Secondary | ICD-10-CM

## 2023-05-01 MED ORDER — ALBUTEROL SULFATE HFA 108 (90 BASE) MCG/ACT IN AERS
1.0000 | INHALATION_SPRAY | Freq: Four times a day (QID) | RESPIRATORY_TRACT | 1 refills | Status: AC | PRN
Start: 2023-05-01 — End: ?

## 2023-05-01 MED ORDER — FLUTICASONE PROPIONATE 50 MCG/ACT NA SUSP: 2.0000 | Freq: Every day | NASAL | 5 refills | Status: AC

## 2023-05-01 MED ORDER — CETIRIZINE HCL 10 MG PO TABS: 10.0000 mg | ORAL_TABLET | Freq: Every day | ORAL | 5 refills | Status: AC

## 2023-05-01 MED ORDER — AZELASTINE HCL 0.1 % NA SOLN: 1.0000 | Freq: Two times a day (BID) | NASAL | 5 refills | Status: AC | PRN

## 2023-05-01 NOTE — Progress Notes (Signed)
FOLLOW UP Date of Service/Encounter:  05/01/23   Subjective:  Barbara Flores (DOB: [birth year]) is a 62 y.o. female who [facility] for follow up for asthma and allergic rhinitis.   History obtained from: chart review and patient. Last visit was on 03/06/2023; discussed Asmanex with flare ups/respiratory illness; on Flonase, Azelastine/zyrtec/Olopatadine for allergic rhinitis   Asthma has done well.  Went to [place] where there is pollution and rarely had symptoms.  Requires about Albuterol 1x/week or less for dyspnea with activity.  No wheezing.  No ER visits/oral prednisone since last visit.   Did have some runny nose/drainage while in [place] but resolved since she came here. Using Flonase/Zyrtec daily with rare need for Azelastine.  Did eat seafood- shrimp, crab, fish and did not have any reactions.   Past Medical History: Past Medical History:  Diagnosis Date   Allergy    Asthma    Diabetes mellitus 08/23/2010   GERD (gastroesophageal reflux disease)    History of exercise stress test 07-2010   normal except for elevated BP   Hyperlipidemia    Hypertension    Menopause    Rosacea     Objective:  BP 118/66   Pulse 92   Temp 98 F (36.7 C) (Temporal)   Wt 148 lb 6.4 oz (67.3 kg)   LMP 03/08/2016   SpO2 98%   BMI 29.97 kg/m  Body mass index is 29.97 kg/m. Physical Exam: GEN: alert, well developed HEENT: clear conjunctiva, TM grey and translucent, nose with mild inferior turbinate hypertrophy, pink nasal mucosa, clear rhinorrhea, + cobblestoning HEART: regular rate and rhythm, no murmur LUNGS: clear to auscultation bilaterally, no coughing, unlabored respiration SKIN: no rashes or lesions  Spirometry:  Tracings reviewed. Her effort: Good reproducible efforts. FVC: 2.52L, 112% FEV1: 1.91L, 103% predicted FEV1/FVC ratio: 76% Interpretation: Spirometry consistent with normal pattern.  Please see scanned spirometry results  for details.  Assessment:   1. Seasonal allergic rhinitis due to pollen   2. Mild intermittent asthma without complication     Plan/Recommendations:  Mild Intermittent Asthma: - Well controlled, normal spirometry today.  - With respiratory illness or flare ups, start Asmanex 1 puff twice daily for 1-2 weeks.  - Maintenance inhaler: none - Rescue inhaler: Albuterol 2 puffs via spacer or 1 vial via nebulizer every 4-6 hours as needed for respiratory symptoms of cough, shortness of breath, or wheezing Asthma control goals:  Full participation in all desired activities (may need albuterol before activity) Albuterol use two times or less a week on average (not counting use with activity) Cough interfering with sleep two times or less a month Oral steroids no more than once a year No hospitalizations   Allergic Rhinitis: - Controlled  - Positive skin test 02/2023: trees, grasses, weeds  - Avoidance measures discussed. - Use nasal saline rinses before nose sprays such as with Neilmed Sinus Rinse.  Use distilled water.   - Use Flonase 2 sprays each nostril daily. Aim upward and outward. - Use Azelastine 1-2 sprays each nostril twice daily as needed for runny nose, drainage, sneezing, congestion. Aim upward and outward. - Use Zyrtec 10 mg daily.  - For eyes, use Olopatadine or Ketotifen 1 eye drop daily as needed for itchy, watery eyes.  Available over the counter, if not covered by insurance.  - Consider allergy shots as long term control of your symptoms by teaching your immune system to be more tolerant of your allergy  triggers    Return in about 4 months (around 08/29/2023).  Alesia Morin, MD [facility]

## 2023-05-01 NOTE — Patient Instructions (Addendum)
Mild Intermittent Asthma: - With respiratory illness or flare ups, start Asmanex 1 puff twice daily for 1-2 weeks.  - Maintenance inhaler: none - Rescue inhaler: Albuterol 2 puffs via spacer or 1 vial via nebulizer every 4-6 hours as needed for respiratory symptoms of cough, shortness of breath, or wheezing Asthma control goals:  Full participation in all desired activities (may need albuterol before activity) Albuterol use two times or less a week on average (not counting use with activity) Cough interfering with sleep two times or less a month Oral steroids no more than once a year No hospitalizations   Allergic Rhinitis: - Positive skin test 02/2023: trees, grasses, weeds  - Avoidance measures discussed. - Use nasal saline rinses before nose sprays such as with Neilmed Sinus Rinse.  Use distilled water.   - Use Flonase 2 sprays each nostril daily. Aim upward and outward. - Use Azelastine 1-2 sprays each nostril twice daily as needed for runny nose, drainage, sneezing, congestion. Aim upward and outward. - Use Zyrtec 10 mg daily.  - For eyes, use Olopatadine or Ketotifen 1 eye drop daily as needed for itchy, watery eyes.  Available over the counter, if not covered by insurance.  - Consider allergy shots as long term control of your symptoms by teaching your immune system to be more tolerant of your allergy triggers

## 2023-05-29 ENCOUNTER — Encounter: Payer: Self-pay | Admitting: Internal Medicine

## 2023-05-29 ENCOUNTER — Ambulatory Visit (INDEPENDENT_AMBULATORY_CARE_PROVIDER_SITE_OTHER): Payer: BC Managed Care – PPO | Admitting: Internal Medicine

## 2023-05-29 VITALS — BP 138/80 | HR 93 | Temp 97.8°F | Resp 16 | Ht 59.0 in | Wt 148.0 lb

## 2023-05-29 DIAGNOSIS — I10 Essential (primary) hypertension: Secondary | ICD-10-CM | POA: Diagnosis not present

## 2023-05-29 DIAGNOSIS — E785 Hyperlipidemia, unspecified: Secondary | ICD-10-CM

## 2023-05-29 DIAGNOSIS — E559 Vitamin D deficiency, unspecified: Secondary | ICD-10-CM | POA: Diagnosis not present

## 2023-05-29 DIAGNOSIS — E119 Type 2 diabetes mellitus without complications: Secondary | ICD-10-CM | POA: Diagnosis not present

## 2023-05-29 DIAGNOSIS — Z09 Encounter for follow-up examination after completed treatment for conditions other than malignant neoplasm: Secondary | ICD-10-CM

## 2023-05-29 LAB — BASIC METABOLIC PANEL
BUN: 11 mg/dL (ref 6–23)
CO2: 24 meq/L (ref 19–32)
Calcium: 9.3 mg/dL (ref 8.4–10.5)
Chloride: 104 meq/L (ref 96–112)
Creatinine, Ser: 0.76 mg/dL (ref 0.40–1.20)
GFR: 85.26 mL/min (ref >60.00)
Glucose, Bld: 121 mg/dL — ABNORMAL HIGH (ref 70–99)
Potassium: 3.4 meq/L — ABNORMAL LOW (ref 3.5–5.1)
Sodium: 140 meq/L (ref 135–145)

## 2023-05-29 LAB — LIPID PANEL
Cholesterol: 150 mg/dL (ref 0–200)
HDL: 60.9 mg/dL (ref >39.00)
LDL Cholesterol: 77 mg/dL (ref 0–99)
NonHDL: 89.51
Total CHOL/HDL Ratio: 2
Triglycerides: 64 mg/dL (ref 0.0–149.0)
VLDL: 12.8 mg/dL (ref 0.0–40.0)

## 2023-05-29 LAB — AST: AST: 21 U/L (ref 0–37)

## 2023-05-29 LAB — VITAMIN D 25 HYDROXY (VIT D DEFICIENCY, FRACTURES): VITD: 23.69 ng/mL — ABNORMAL LOW (ref 30.00–100.00)

## 2023-05-29 LAB — ALT: ALT: 26 U/L (ref 0–35)

## 2023-05-29 NOTE — Progress Notes (Unsigned)
Subjective:    Patient ID: Barbara Flores, female    DOB: [birth year], 62 y.o.   MRN: [id number]  DOS:  05/29/2023 Type of visit - description: f/u  Chronic medical problems addressed. Not taking any vitamin supplements at present. Reports diabetes is under excellent control. BPs are checked regularly and they are WNL. Denies any lower extremity paresthesias Wt Readings from Last 3 Encounters:  05/29/23 148 lb (67.1 kg)  05/01/23 148 lb 6.4 oz (67.3 kg)  03/06/23 148 lb 6.4 oz (67.3 kg)    Review of Systems See above   Past Medical History:  Diagnosis Date   Allergy    Asthma    Diabetes mellitus 08/23/2010   GERD (gastroesophageal reflux disease)    History of exercise stress test 07-2010   normal except for elevated BP   Hyperlipidemia    Hypertension    Menopause    Rosacea     Past Surgical History:  Procedure Laterality Date   COLONOSCOPY     Eardrum surgery     HEMORRHOID SURGERY     NASAL SINUS SURGERY     OTHER SURGICAL HISTORY     female surgery after miscarriage, ? D&C or edometrial surgery   UPPER GASTROINTESTINAL ENDOSCOPY      Current Outpatient Medications  Medication Instructions   Accu-Chek Softclix Lancets lancets USE 1 LANCET TO CHECK BLOOD SUGAR IN THE MORNING, AT NOON AND AT BEDTIME   albuterol (PROVENTIL) 2.5 mg, Nebulization, Every 6 hours PRN   albuterol (VENTOLIN HFA) 108 (90 Base) MCG/ACT inhaler 1-2 puffs, Inhalation, Every 6 hours PRN   amLODipine (NORVASC) 2.5 mg, Oral, Daily   aspirin 81 mg, Daily   Azelaic Acid 15 % gel 2 times daily   azelastine (ASTELIN) 0.1 % nasal spray 1 spray, Each Nare, 2 times daily PRN, Use in each nostril as directed   Blood Glucose Monitoring Suppl DEVI 1 each, Does not apply, 3 times daily, May substitute to any manufacturer covered by patient's insurance.   cetirizine (ZYRTEC ALLERGY) 10 mg, Oral, Daily   Cholecalciferol (VITAMIN D) 50 MCG (2000 UT) CAPS Take by mouth.   empagliflozin (JARDIANCE) 25 mg,  Oral, Every morning   fluticasone (FLONASE) 50 MCG/ACT nasal spray 2 sprays, Each Nare, Daily   Fluticasone Propionate, Inhal, (FLOVENT DISKUS) 100 MCG/BLIST AEPB Take 2 puffs twice daily during flares. Rinse your mouth out after use.   Glucose Blood (BLOOD GLUCOSE TEST STRIPS) STRP Check blood sugars three times daily   losartan-hydrochlorothiazide (HYZAAR) 100-25 MG tablet 1 tablet, Oral, Daily   meclizine (ANTIVERT) 25 mg, Oral, 3 times daily PRN   meloxicam (MOBIC) 15 mg, Oral, Daily PRN   Mometasone Furoate (ASMANEX HFA) 100 MCG/ACT AERO With respiratory illness or flare ups, start Asmanex 1 puff twice daily for 1-2 weeks.   nitroGLYCERIN (NITRODUR - DOSED IN MG/24 HR) 0.2 mg/hr patch Cut and apply 1/4 patch to most painful area q24h.   Olopatadine HCl 0.2 % SOLN 1 drop, Ophthalmic, Daily PRN   pantoprazole (PROTONIX) 40 MG tablet Use protonix (pantoprazole) 40 mg twice a day for 3 months followed by 40 mg once daily thereafter   RESTASIS 0.05 % ophthalmic emulsion 1 drop, 2 times daily   rosuvastatin (CRESTOR) 20 mg, Oral, Daily at bedtime   Semaglutide (1 MG/DOSE) 1 mg, Injection, Weekly   tiZANidine (ZANAFLEX) 4 mg, Oral, Every 6 hours PRN       Objective:   Physical Exam BP 138/80   Pulse  93   Temp 97.8 F (36.6 C) (Oral)   Resp 16   Ht 4\' 11"  (1.499 m)   Wt 148 lb (67.1 kg)   LMP 03/08/2016   SpO2 97%   BMI 29.89 kg/m  General:   Well developed, NAD, BMI noted. HEENT:  Normocephalic . Face symmetric, atraumatic Lungs:  CTA B Normal respiratory effort, no intercostal retractions, no accessory muscle use. Heart: RRR,  no murmur.  DM foot exam: No edema, good pedal pulses, pinprick examination normal Skin: Not pale. Not jaundice Neurologic:  alert & oriented X3.  Speech normal, gait appropriate for age and unassisted Psych--  Cognition and judgment appear intact.  Cooperative with normal attention span and concentration.  Behavior appropriate. No anxious  or depressed appearing.      Assessment   Assessment  DM dx  2012 (Metformin intolerant 10/2019) HTN Dyslipidemia (intol to atorvastatin per patient)    asthma Menopause at age 10 Rosacea GI: --RUQ pain: Ultrasound and HIDA scan negative (02-2015, 03-2015) -- 07/2016--  H Pylori  (+) , partially treated biaxin/flagel, poor tolerance  --constipation  Vitamin D deficiency Stress test 2012 neg , elevated BP +FH CAD brother MI 37 y/o  PLAN DM: Per Endo, reports good control, feet exam negative. [id number] ambulatory BPs are normal.  Continue amlodipine, Hyzaar, check BMP, BP goals provided. High cholesterol: On rosuvastatin, check a FLP AST ALT. Vitamin D deficiency: Took ergocalciferol, currently on no supplements.  Recommend 2000 units daily, check levels.  Further advised for results. Preventive care: Reportedly had Shingrix x 2 at [facility], had a flu shot.  Recommend COVID-vaccine. RTC 6 months CPX

## 2023-05-29 NOTE — Patient Instructions (Addendum)
Start taking a vitamin D supplement over-the-counter: 2000 units every day.  Vaccines I recommend: Covid booster   Check the  blood pressure regularly Blood pressure goal:  between 110/65 and  135/85. If it is consistently higher or lower, let me know     GO TO THE LAB : Get the blood work     Next visit with me  by 11/2023 for a physical exam    Please schedule it at the front desk

## 2023-05-30 NOTE — Assessment & Plan Note (Signed)
DM: Per Endo, reports good control, feet exam negative. [id number] ambulatory BPs are normal.  Continue amlodipine, Hyzaar, check BMP, BP goals provided. High cholesterol: On rosuvastatin, check a FLP AST ALT.further advised with results Vitamin D deficiency: Took ergocalciferol, currently on no supplements.  Recommend 2000 units daily, check levels.  Further advised for results. Preventive care: Reportedly had Shingrix x 2 at [facility], had a flu shot.  Recommend COVID-vaccine. RTC 6 months CPX

## 2023-05-31 ENCOUNTER — Other Ambulatory Visit: Payer: Self-pay | Admitting: Internal Medicine

## 2023-06-01 MED ORDER — VITAMIN D (ERGOCALCIFEROL) 1.25 MG (50000 UNIT) PO CAPS: 50000.0000 [IU] | ORAL_CAPSULE | ORAL | 0 refills | Status: AC

## 2023-06-01 NOTE — Addendum Note (Signed)
Addended byConrad Harlan D on: 06/01/2023 12:58 PM   Modules accepted: Orders

## 2023-06-02 ENCOUNTER — Encounter: Payer: Self-pay | Admitting: Internal Medicine

## 2023-06-02 ENCOUNTER — Ambulatory Visit (INDEPENDENT_AMBULATORY_CARE_PROVIDER_SITE_OTHER): Payer: BC Managed Care – PPO | Admitting: Internal Medicine

## 2023-06-02 VITALS — BP 124/76 | HR 88 | Ht 59.0 in | Wt 148.0 lb

## 2023-06-02 DIAGNOSIS — E119 Type 2 diabetes mellitus without complications: Secondary | ICD-10-CM | POA: Diagnosis not present

## 2023-06-02 DIAGNOSIS — Z7984 Long term (current) use of oral hypoglycemic drugs: Secondary | ICD-10-CM

## 2023-06-02 DIAGNOSIS — K5903 Drug induced constipation: Secondary | ICD-10-CM | POA: Diagnosis not present

## 2023-06-02 DIAGNOSIS — Z7985 Long-term (current) use of injectable non-insulin antidiabetic drugs: Secondary | ICD-10-CM | POA: Diagnosis not present

## 2023-06-02 LAB — POCT GLYCOSYLATED HEMOGLOBIN (HGB A1C): Hemoglobin A1C: 6.4 % — AB (ref 4.0–5.6)

## 2023-06-02 MED ORDER — SEMAGLUTIDE (1 MG/DOSE) 4 MG/3ML ~~LOC~~ SOPN: 1.0000 mg | PEN_INJECTOR | SUBCUTANEOUS | 3 refills | Status: DC

## 2023-06-02 MED ORDER — EMPAGLIFLOZIN 25 MG PO TABS: 25.0000 mg | ORAL_TABLET | Freq: Every morning | ORAL | 3 refills | Status: DC

## 2023-06-02 NOTE — Progress Notes (Signed)
Name: Barbara Flores  MRN/ DOB: [id number], 04/11/1961   Age/ Sex: 62 y.o., female    PCP: Wanda Plump, MD   Reason for Endocrinology Evaluation: Type 2 Diabetes Mellitus     Date of Initial Endocrinology Visit: 04/25/2022    PATIENT IDENTIFIER: Barbara Flores is a 62 y.o. female with a past medical history of HTN, DM and dyslipidemia. The patient presented for initial endocrinology clinic visit on 04/25/2022 for consultative assistance with her diabetes management.    HPI: Barbara Flores was    Diagnosed with DM 2018 Prior Medications tried/Intolerance: Metformin - GI side effects            Hemoglobin A1c has ranged from 6.5% in 2021, peaking at 9.2% in 2022.   On her initial visit with me she had an A1c of 9.4%, she was on Januvia, pioglitazone, SGLT2 inhibitors were cost prohibitive and Rybelsus was not covered by her insurance so we opted to continue pioglitazone and Januvia and started glipizide  She was started on Jardiance 06/2022 through PCP  She had to stop pioglitazone due to insurance issue , stopped 06/2022   Switch Rybelsus to Ozempic, per her request as she attributed headaches to Rybelsus 10/2022  SUBJECTIVE:   During the last visit (10/27/2022): A1c 7.5%    Today (06/02/23): Barbara Flores is here for follow-up on diabetes management, she checks her blood sugars 1 times daily. No hypoglycemia    Has noted nausea and constipation - she is on stool softer  Denies diarrhea     HOME DIABETES REGIMEN: Jardiance 25 mg daily  Ozempic 1 mg weekly     Statin: Yes ACE-I/ARB: Yes Prior Diabetic Education: no   METER DOWNLOAD SUMMARY: Unable to download 96-  141   DIABETIC COMPLICATIONS: Microvascular complications:   Denies: CKD,  Last eye exam: Completed 07/18/2022  Macrovascular complications:   Denies: CAD, PVD, CVA   PAST HISTORY: Past Medical History:  Past Medical History:  Diagnosis Date   Allergy    Asthma    Diabetes mellitus 08/23/2010    GERD (gastroesophageal reflux disease)    History of exercise stress test 07-2010   normal except for elevated BP   Hyperlipidemia    Hypertension    Menopause    Rosacea    Past Surgical History:  Past Surgical History:  Procedure Laterality Date   COLONOSCOPY     Eardrum surgery     HEMORRHOID SURGERY     NASAL SINUS SURGERY     OTHER SURGICAL HISTORY     female surgery after miscarriage, ? D&C or edometrial surgery   UPPER GASTROINTESTINAL ENDOSCOPY      Social History:  reports that she has never smoked. She has never been exposed to tobacco smoke. She has never used smokeless tobacco. She reports current alcohol use. She reports that she does not use drugs. Family History:  Family History  Problem Relation Age of Onset   Diabetes Mother    Hyperlipidemia Mother    Hypertension Mother    Stroke Mother 26   Kidney failure Mother    Stomach cancer Father        deceased   Breast cancer Sister 68   Heart attack Brother 73   Colon cancer Neg Hx    Esophageal cancer Neg Hx    Rectal cancer Neg Hx      HOME MEDICATIONS: Allergies as of 06/02/2023       Reactions   Hydrocodone Nausea Only  Penicillins    vomitting   Amoxicillin Rash        Medication List        Accurate as of June 02, 2023  9:00 AM. If you have any questions, ask your nurse or doctor.          Accu-Chek Softclix Lancets lancets USE 1 LANCET TO CHECK BLOOD SUGAR IN THE MORNING, AT NOON AND AT BEDTIME   albuterol (2.5 MG/3ML) 0.083% nebulizer solution Commonly known as: PROVENTIL Take 3 mLs (2.5 mg total) by nebulization every 6 (six) hours as needed for wheezing or shortness of breath.   albuterol 108 (90 Base) MCG/ACT inhaler Commonly known as: VENTOLIN HFA Inhale 1-2 puffs into the lungs every 6 (six) hours as needed for wheezing or shortness of breath.   amLODipine 2.5 MG tablet Commonly known as: NORVASC Take 1 tablet (2.5 mg total) by mouth daily.   Asmanex HFA 100  MCG/ACT Aero Generic drug: Mometasone Furoate With respiratory illness or flare ups, start Asmanex 1 puff twice daily for 1-2 weeks.   aspirin 81 MG tablet Take 81 mg by mouth daily.   Azelaic Acid 15 % gel Apply topically 2 (two) times daily.   azelastine 0.1 % nasal spray Commonly known as: ASTELIN Place 1 spray into both nostrils 2 (two) times daily as needed. Use in each nostril as directed   Blood Glucose Monitoring Suppl Devi 1 each by Does not apply route in the morning, at noon, and at bedtime. May substitute to any manufacturer covered by patient's insurance.   BLOOD GLUCOSE TEST STRIPS Strp Check blood sugars three times daily   cetirizine 10 MG tablet Commonly known as: ZyrTEC Allergy Take 1 tablet (10 mg total) by mouth daily.   empagliflozin 25 MG Tabs tablet Commonly known as: Jardiance Take 1 tablet (25 mg total) by mouth every morning.   Flovent Diskus 100 MCG/BLIST Aepb Generic drug: Fluticasone Propionate (Inhal) Take 2 puffs twice daily during flares. Rinse your mouth out after use.   fluticasone 50 MCG/ACT nasal spray Commonly known as: FLONASE Place 2 sprays into both nostrils daily.   losartan-hydrochlorothiazide 100-25 MG tablet Commonly known as: HYZAAR Take 1 tablet by mouth daily.   meclizine 25 MG tablet Commonly known as: ANTIVERT Take 1 tablet (25 mg total) by mouth 3 (three) times daily as needed for dizziness.   meloxicam 15 MG tablet Commonly known as: MOBIC Take 1 tablet (15 mg total) by mouth daily as needed.   nitroGLYCERIN 0.2 mg/hr patch Commonly known as: NITRODUR - Dosed in mg/24 hr Cut and apply 1/4 patch to most painful area q24h.   Olopatadine HCl 0.2 % Soln Apply 1 drop to eye daily as needed (itchy watery eyes).   pantoprazole 40 MG tablet Commonly known as: PROTONIX Use protonix (pantoprazole) 40 mg twice a day for 3 months followed by 40 mg once daily thereafter   Restasis 0.05 % ophthalmic  emulsion Generic drug: cycloSPORINE Place 1 drop into both eyes 2 (two) times daily.   rosuvastatin 20 MG tablet Commonly known as: CRESTOR Take 1 tablet (20 mg total) by mouth at bedtime.   Semaglutide (1 MG/DOSE) 4 MG/3ML Sopn Inject 1 mg as directed once a week.   tiZANidine 4 MG tablet Commonly known as: Zanaflex Take 1 tablet (4 mg total) by mouth every 6 (six) hours as needed for muscle spasms.   Vitamin D (Ergocalciferol) 1.25 MG (50000 UNIT) Caps capsule Commonly known as: DRISDOL Take 1 capsule (  50,000 Units total) by mouth every 7 (seven) days.   Vitamin D 50 MCG (2000 UT) Caps Take by mouth.         ALLERGIES: Allergies  Allergen Reactions   Hydrocodone Nausea Only   Penicillins     vomitting   Amoxicillin Rash     REVIEW OF SYSTEMS: A comprehensive ROS was conducted with the patient and is negative except as per HPI and below:      OBJECTIVE:   VITAL SIGNS: BP 124/76 (BP Location: Left Arm, Patient Position: Sitting, Cuff Size: Small)   Pulse 88   Ht 4\' 11"  (1.499 m)   Wt 148 lb (67.1 kg)   LMP 03/08/2016   SpO2 99%   BMI 29.89 kg/m    PHYSICAL EXAM:  General: Pt appears well and is in NAD  Neck: General: Supple without adenopathy or carotid bruits. Thyroid: Thyroid size normal.  No goiter or nodules appreciated.   Lungs: Clear with good BS bilat with no rales, rhonchi, or wheezes  Heart: RRR   Abdomen:  soft, nontender  Extremities:  Lower extremities - No pretibial edema. No lesions.  Neuro: MS is good with appropriate affect, pt is alert and Ox3    DM foot exam: 04/25/2022  The skin of the feet is intact without sores or ulcerations. The pedal pulses are 2+ on right and 2+ on left. The sensation is intact to a screening 5.07, 10 gram monofilament bilaterally   DATA REVIEWED:  Lab Results  Component Value Date   HGBA1C 6.4 (A) 06/02/2023   HGBA1C 7.5 (A) 09/19/2022   HGBA1C 9.4 (A) 04/25/2022    Latest Reference Range &  Units 05/29/23 08:36  Sodium 135 - 145 mEq/L 140  Potassium 3.5 - 5.1 mEq/L 3.4 (L)  Chloride 96 - 112 mEq/L 104  CO2 19 - 32 mEq/L 24  Glucose 70 - 99 mg/dL 528 (H)  BUN 6 - 23 mg/dL 11  Creatinine 4.13 - 2.44 mg/dL 0.10  Calcium 8.4 - 27.2 mg/dL 9.3  AST 0 - 37 U/L 21  ALT 0 - 35 U/L 26  GFR >60.00 mL/min 85.26  Total CHOL/HDL Ratio  2  Cholesterol 0 - 200 mg/dL 536  HDL Cholesterol >64.40 mg/dL 34.74  LDL (calc) 0 - 99 mg/dL 77  NonHDL  25.95  Triglycerides 0.0 - 149.0 mg/dL 63.8  VLDL 0.0 - 75.6 mg/dL 43.3    ASSESSMENT / PLAN / RECOMMENDATIONS:   1) Type 2 Diabetes Mellitus, Optimally  controlled, Without complications - Most recent A1c of 6.4 %. Goal A1c < 7.0 %.    -I have the patient on optimizing glucose control and weight loss -I have encouraged the patient to avoid snacks and increase water and fiber intake -No changes at this time, because she already has constipation so increasing Ozempic dose will worsen her symptoms   MEDICATIONS:   Continue Ozempic 1 mg weekly Continue Jardiance 25 mg daily   EDUCATION / INSTRUCTIONS: BG monitoring instructions: Patient is instructed to check her blood sugars 2-3 times a week. Call [place] if: BG persistently < 70  I reviewed the Rule of 15 for the treatment of hypoglycemia in detail with the patient. Literature supplied.   2) Diabetic complications:  Eye: Does not have known diabetic retinopathy.  Neuro/ Feet: Does not have known diabetic peripheral neuropathy. Renal: Patient does not have known baseline CKD.   3) Constipation:  -Encouraged hydration, increase fiber intake, she may use OTC magnesium tablets/Gummies  Follow-up in 6 months  Signed electronically by: Lyndle Herrlich, MD  [facility] Endocrinology  [facility] [address] [place] [place], [place] [address] Phone: [phone] FAX: [phone]   CC: Wanda Plump, MD [address] [address] [facility] [address] [place] [address] Phone: [phone]  Fax: [phone]    Return to Endocrinology clinic as below: Future Appointments  Date Time Provider Department Center  08/21/2023 10:30 AM Birder Robson, MD [facility] None  11/30/2023  8:40 AM Wanda Plump, MD [facility]

## 2023-06-02 NOTE — Patient Instructions (Signed)
Continue Ozempic 1 mg once weekly   Continue Jardiance 25 mg daily      HOW TO TREAT LOW BLOOD SUGARS (Blood sugar LESS THAN 70 MG/DL) Please follow the RULE OF 15 for the treatment of hypoglycemia treatment (when your (blood sugars are less than 70 mg/dL)   STEP 1: Take 15 grams of carbohydrates when your blood sugar is low, which includes:  3-4 GLUCOSE TABS  OR 3-4 OZ OF JUICE OR REGULAR SODA OR ONE TUBE OF GLUCOSE GEL    STEP 2: RECHECK blood sugar in 15 MINUTES STEP 3: If your blood sugar is still low at the 15 minute recheck --> then, go back to STEP 1 and treat AGAIN with another 15 grams of carbohydrates.

## 2023-06-19 ENCOUNTER — Ambulatory Visit (INDEPENDENT_AMBULATORY_CARE_PROVIDER_SITE_OTHER): Payer: BC Managed Care – PPO | Admitting: Internal Medicine

## 2023-06-19 ENCOUNTER — Encounter: Payer: Self-pay | Admitting: Internal Medicine

## 2023-06-19 VITALS — BP 132/68 | HR 97 | Temp 98.0°F | Resp 16 | Ht 59.0 in | Wt 148.2 lb

## 2023-06-19 DIAGNOSIS — H9202 Otalgia, left ear: Secondary | ICD-10-CM | POA: Diagnosis not present

## 2023-06-19 MED ORDER — NEOMYCIN-POLYMYXIN-HC 3.5-10000-1 OT SUSP: 3.0000 [drp] | Freq: Four times a day (QID) | OTIC | 0 refills | Status: DC

## 2023-06-19 MED ORDER — AZITHROMYCIN 250 MG PO TABS: ORAL_TABLET | ORAL | 0 refills | Status: DC

## 2023-06-19 NOTE — Assessment & Plan Note (Signed)
 L otalgia: As described above, she saw some discharge, but it was not bloody rather brown, I wonder if this was just cerumen. Today the canal skin seems slightly irritated but she just self flush the left ear a couple of hours before coming to the office. She is diabetic, thus will prescribe if Cortisporin eardrops and oral antibiotics for possible early otitis external. She will call if not improving.

## 2023-06-19 NOTE — Progress Notes (Signed)
 Subjective:    Patient ID: Barbara Flores, female    DOB: [birth year], 63 y.o.   MRN: [id number]  DOS:  06/19/2023 Type of visit - description: Acute  3 days history of a feeling of fullness at the left ear, associated to pain and some discharge, brown in color, no bleeding.  No fever chills No recent runny nose, sore throat or cough.   Review of Systems See above   Past Medical History:  Diagnosis Date   Allergy     Asthma    Diabetes mellitus 08/23/2010   GERD (gastroesophageal reflux disease)    History of exercise stress test 07-2010   normal except for elevated BP   Hyperlipidemia    Hypertension    Menopause    Rosacea     Past Surgical History:  Procedure Laterality Date   COLONOSCOPY     Eardrum surgery     HEMORRHOID SURGERY     NASAL SINUS SURGERY     OTHER SURGICAL HISTORY     female surgery after miscarriage, ? D&C or edometrial surgery   UPPER GASTROINTESTINAL ENDOSCOPY      Current Outpatient Medications  Medication Instructions   Accu-Chek Softclix Lancets lancets USE 1 LANCET TO CHECK BLOOD SUGAR IN THE MORNING, AT NOON AND AT BEDTIME   albuterol  (PROVENTIL ) 2.5 mg, Nebulization, Every 6 hours PRN   albuterol  (VENTOLIN  HFA) 108 (90 Base) MCG/ACT inhaler 1-2 puffs, Inhalation, Every 6 hours PRN   amLODipine  (NORVASC ) 2.5 mg, Oral, Daily   aspirin 81 mg, Daily   Azelaic Acid 15 % gel 2 times daily   azelastine  (ASTELIN ) 0.1 % nasal spray 1 spray, Each Nare, 2 times daily PRN, Use in each nostril as directed   Blood Glucose Monitoring Suppl DEVI 1 each, Does not apply, 3 times daily, May substitute to any manufacturer covered by patient's insurance.   cetirizine  (ZYRTEC  ALLERGY ) 10 mg, Oral, Daily   Cholecalciferol (VITAMIN D ) 50 MCG (2000 UT) CAPS Take by mouth.   empagliflozin  (JARDIANCE ) 25 mg, Oral, Every morning   fluticasone  (FLONASE ) 50 MCG/ACT nasal spray 2 sprays, Each Nare, Daily   Fluticasone  Propionate, Inhal, (FLOVENT  DISKUS) 100 MCG/BLIST  AEPB Take 2 puffs twice daily during flares. Rinse your mouth out after use.   Glucose Blood (BLOOD GLUCOSE TEST STRIPS) STRP Check blood sugars three times daily   losartan -hydrochlorothiazide  (HYZAAR) 100-25 MG tablet 1 tablet, Oral, Daily   meclizine  (ANTIVERT ) 25 mg, Oral, 3 times daily PRN   meloxicam  (MOBIC ) 15 mg, Oral, Daily PRN   Mometasone Furoate  (ASMANEX  HFA) 100 MCG/ACT AERO With respiratory illness or flare ups, start Asmanex  100mcg 1 puff twice daily for 1-2 weeks.   nitroGLYCERIN  (NITRODUR - DOSED IN MG/24 HR) 0.2 mg/hr patch Cut and apply 1/4 patch to most painful area q24h.   Olopatadine  HCl 0.2 % SOLN 1 drop, Ophthalmic, Daily PRN   pantoprazole  (PROTONIX ) 40 MG tablet Use protonix  (pantoprazole ) 40 mg twice a day for 3 months followed by 40 mg once daily thereafter   RESTASIS 0.05 % ophthalmic emulsion 1 drop, 2 times daily   rosuvastatin  (CRESTOR ) 20 mg, Oral, Daily at bedtime   Semaglutide  (1 MG/DOSE) 1 mg, Injection, Weekly   tiZANidine  (ZANAFLEX ) 4 mg, Oral, Every 6 hours PRN   Vitamin D  (Ergocalciferol ) (DRISDOL ) 50,000 Units, Oral, Every 7 days       Objective:   Physical Exam BP 132/68   Pulse 97   Temp 98 F (36.7 C) (Oral)   Resp  16   Ht 4' 11 (1.499 m)   Wt 148 lb 4 oz (67.2 kg)   LMP 03/08/2016   SpO2 98%   BMI 29.94 kg/m  General:   Well developed, NAD, BMI noted. HEENT:  Normocephalic . Face symmetric, atraumatic Throat symmetric.  Nose noncongested. R ear: Normal L ear: Tympanic membrane seems intact, minimal redness and irregularity of the ear canal  skin which is not narrow.  Trago sign negative.  No  discharge. Skin: Not pale. Not jaundice Neurologic:  alert & oriented X3.  Speech normal, gait appropriate for age and unassisted Psych--  Cognition and judgment appear intact.  Cooperative with normal attention span and concentration.  Behavior appropriate. No anxious or depressed appearing.      Assessment   Assessment  DM dx  2012  (Metformin  intolerant 10/2019) HTN Dyslipidemia (intol to atorvastatin per patient)    asthma Menopause at age 55 Rosacea GI: --RUQ pain: Ultrasound and HIDA scan negative (02-2015, 03-2015) -- 07/2016--  H Pylori  (+) , partially treated biaxin /flagel, poor tolerance  --constipation  Vitamin D  deficiency Stress test 2012 neg , elevated BP +FH CAD brother MI 36 y/o  PLAN L otalgia: As described above, she saw some discharge, but it was not bloody rather brown, I wonder if this was just cerumen. Today the canal skin seems slightly irritated but she just self flush the left ear a couple of hours before coming to the office. She is diabetic, thus will prescribe if Cortisporin eardrops and oral antibiotics for possible early otitis external. She will call if not improving.

## 2023-06-19 NOTE — Patient Instructions (Signed)
 Use the eardrops for 4 days.  Take the antibiotic as prescribed  Call : Ear swelling, discharge, difficulty hearing.  Also if you are not improving in few days  Immediate medical attention if: Fever, chills, severe pain

## 2023-06-22 ENCOUNTER — Telehealth: Payer: Self-pay | Admitting: Internal Medicine

## 2023-06-22 NOTE — Telephone Encounter (Signed)
 Letter sent to my chart

## 2023-06-22 NOTE — Telephone Encounter (Signed)
**Note De-identified  [place] Obfuscation** Please advise 

## 2023-06-22 NOTE — Telephone Encounter (Signed)
 That is okay.

## 2023-06-22 NOTE — Telephone Encounter (Signed)
 Copied from CRM [id number]. Topic: General - Other >> Jun 22, 2023  8:17 AM Isabell A wrote: Reason for CRM: Patient states her return to work date is today, she still is not feeling well & is requesting a new note to return back to work tomorrow instead.   Ok to send in [place].

## 2023-06-24 ENCOUNTER — Ambulatory Visit (INDEPENDENT_AMBULATORY_CARE_PROVIDER_SITE_OTHER): Payer: BC Managed Care – PPO | Admitting: Family Medicine

## 2023-06-24 ENCOUNTER — Encounter: Payer: Self-pay | Admitting: Family Medicine

## 2023-06-24 ENCOUNTER — Other Ambulatory Visit (HOSPITAL_BASED_OUTPATIENT_CLINIC_OR_DEPARTMENT_OTHER): Payer: Self-pay

## 2023-06-24 VITALS — BP 126/82 | HR 91 | Temp 98.0°F | Resp 16 | Ht 59.0 in | Wt 148.0 lb

## 2023-06-24 DIAGNOSIS — H6992 Unspecified Eustachian tube disorder, left ear: Secondary | ICD-10-CM

## 2023-06-24 DIAGNOSIS — H7292 Unspecified perforation of tympanic membrane, left ear: Secondary | ICD-10-CM | POA: Diagnosis not present

## 2023-06-24 MED ORDER — CIPROFLOXACIN-DEXAMETHASONE 0.3-0.1 % OT SUSP
4.0000 [drp] | Freq: Two times a day (BID) | OTIC | 0 refills | Status: DC
  Filled 2023-06-24: qty 7.5, 19d supply, fill #0

## 2023-06-24 NOTE — Progress Notes (Signed)
 Chief Complaint  Patient presents with   Ear Pain    Left Ear Pain    Pt is here for left ear fullness. Duration: 1  week Progression:  slightly better Associated symptoms: decreased hearing.  Denies: congestion, fevers coryza, bleeding, or discharge from ear Treatment to date: flushed ears, cortisporin, Zpak  Past Medical History:  Diagnosis Date   Allergy     Asthma    Diabetes mellitus 08/23/2010   GERD (gastroesophageal reflux disease)    History of exercise stress test 07-2010   normal except for elevated BP   Hyperlipidemia    Hypertension    Menopause    Rosacea     BP 126/82   Pulse 91   Temp 98 F (36.7 C) (Oral)   Resp 16   Ht 4' 11 (1.499 m)   Wt 148 lb (67.1 kg)   LMP 03/08/2016   SpO2 100%   BMI 29.89 kg/m  General: Awake, alert, appearing stated age HEENT:  L ear- Canal patent without drainage or erythema, TM is without bulging or erythema, there is a small perforation centrally R ear- canal patent without drainage or erythema, TM is neg Lungs: No accessory muscle use Psych: Age appropriate judgment and insight, normal mood and affect  Tympanic membrane perforation, left - Plan: ciprofloxacin -dexamethasone  (CIPRODEX ) OTIC suspension  Dysfunction of left eustachian tube  Drops as above. Cotton ball in ear for 20 min afterwards. Send message in 1 week if no better and will refer to ENT.  Use INCS daily for next several weeks. Letter for work provided.  F/u prn.  Pt voiced understanding and agreement to the plan.  Mabel [place], DO 06/24/23 3:27 PM

## 2023-06-24 NOTE — Patient Instructions (Addendum)
 Use the Flonase  daily for the next couple weeks.  I am going to give you new drops. Put a cotton ball in your ear for 20 minutes after applying drops.   I expect improvement in the next week or so. If not, please let me know.   Let us  know if you need anything.

## 2023-07-01 ENCOUNTER — Telehealth: Payer: Self-pay

## 2023-07-01 DIAGNOSIS — H6992 Unspecified Eustachian tube disorder, left ear: Secondary | ICD-10-CM

## 2023-07-01 DIAGNOSIS — H7292 Unspecified perforation of tympanic membrane, left ear: Secondary | ICD-10-CM

## 2023-07-01 DIAGNOSIS — H9202 Otalgia, left ear: Secondary | ICD-10-CM

## 2023-07-01 NOTE — Telephone Encounter (Signed)
 Copied from CRM [id number]. Topic: Referral - Question >> Jul 01, 2023 10:48 AM Barbara Flores wrote: Reason for CRM: Patient states that she was seen prior for her ear bothering her and that she was prescribed drops. Patient states that her ear is still bothering her and she can barely hear, she is requesting a referral for ENT for [facility].

## 2023-07-01 NOTE — Telephone Encounter (Signed)
 ENT referral placed.

## 2023-07-24 DIAGNOSIS — H35033 Hypertensive retinopathy, bilateral: Secondary | ICD-10-CM | POA: Diagnosis not present

## 2023-07-24 DIAGNOSIS — H11153 Pinguecula, bilateral: Secondary | ICD-10-CM | POA: Diagnosis not present

## 2023-07-24 DIAGNOSIS — H04123 Dry eye syndrome of bilateral lacrimal glands: Secondary | ICD-10-CM | POA: Diagnosis not present

## 2023-07-24 DIAGNOSIS — H524 Presbyopia: Secondary | ICD-10-CM | POA: Diagnosis not present

## 2023-07-24 DIAGNOSIS — H25012 Cortical age-related cataract, left eye: Secondary | ICD-10-CM | POA: Diagnosis not present

## 2023-07-24 LAB — HM DIABETES EYE EXAM

## 2023-08-02 ENCOUNTER — Other Ambulatory Visit: Payer: Self-pay | Admitting: Internal Medicine

## 2023-08-07 ENCOUNTER — Telehealth (INDEPENDENT_AMBULATORY_CARE_PROVIDER_SITE_OTHER): Payer: Self-pay | Admitting: Otolaryngology

## 2023-08-07 NOTE — Telephone Encounter (Signed)
Spoke with patient, confirmed appt and address for 08/10/2023.

## 2023-08-10 ENCOUNTER — Ambulatory Visit (INDEPENDENT_AMBULATORY_CARE_PROVIDER_SITE_OTHER): Payer: BC Managed Care – PPO | Admitting: Audiology

## 2023-08-10 ENCOUNTER — Encounter (INDEPENDENT_AMBULATORY_CARE_PROVIDER_SITE_OTHER): Payer: Self-pay | Admitting: Otolaryngology

## 2023-08-10 ENCOUNTER — Ambulatory Visit (INDEPENDENT_AMBULATORY_CARE_PROVIDER_SITE_OTHER): Payer: BC Managed Care – PPO

## 2023-08-10 ENCOUNTER — Encounter (INDEPENDENT_AMBULATORY_CARE_PROVIDER_SITE_OTHER): Payer: Self-pay

## 2023-08-10 VITALS — BP 154/81 | HR 98 | Ht 59.0 in | Wt 148.0 lb

## 2023-08-10 DIAGNOSIS — H6692 Otitis media, unspecified, left ear: Secondary | ICD-10-CM

## 2023-08-10 DIAGNOSIS — H7202 Central perforation of tympanic membrane, left ear: Secondary | ICD-10-CM

## 2023-08-10 DIAGNOSIS — H9012 Conductive hearing loss, unilateral, left ear, with unrestricted hearing on the contralateral side: Secondary | ICD-10-CM | POA: Diagnosis not present

## 2023-08-10 DIAGNOSIS — H9041 Sensorineural hearing loss, unilateral, right ear, with unrestricted hearing on the contralateral side: Secondary | ICD-10-CM | POA: Diagnosis not present

## 2023-08-10 DIAGNOSIS — H90A12 Conductive hearing loss, unilateral, left ear with restricted hearing on the contralateral side: Secondary | ICD-10-CM | POA: Diagnosis not present

## 2023-08-10 DIAGNOSIS — H7292 Unspecified perforation of tympanic membrane, left ear: Secondary | ICD-10-CM

## 2023-08-10 DIAGNOSIS — H9202 Otalgia, left ear: Secondary | ICD-10-CM

## 2023-08-10 MED ORDER — CIPROFLOXACIN-DEXAMETHASONE 0.3-0.1 % OT SUSP: 4.0000 [drp] | Freq: Two times a day (BID) | OTIC | 6 refills | Status: AC

## 2023-08-11 DIAGNOSIS — H9012 Conductive hearing loss, unilateral, left ear, with unrestricted hearing on the contralateral side: Secondary | ICD-10-CM | POA: Insufficient documentation

## 2023-08-11 DIAGNOSIS — H9202 Otalgia, left ear: Secondary | ICD-10-CM | POA: Insufficient documentation

## 2023-08-11 DIAGNOSIS — H7202 Central perforation of tympanic membrane, left ear: Secondary | ICD-10-CM | POA: Insufficient documentation

## 2023-08-11 NOTE — Progress Notes (Unsigned)
  [address], [address] [place], [place] [address] [phone]  Audiological Evaluation    Name: Barbara Flores     DOB:   [birth year]      MRN:   [id number]                                                                                     Service Date: 08/11/2023     Accompanied by: unaccompanied    Patient comes today after Dr. Suszanne Conners, ENT sent a referral for a hearing evaluation due to concerns with left eardrum perforation.   Symptoms Yes Details  Hearing loss  [x]  Left ear hearing loss  Tinnitus  []    Ear pain/ infections/pressure  []    Balance problems  []    Noise exposure history  []    Previous ear surgeries  []    Family history of hearing loss  []    Amplification  []    Other  []      Otoscopy: Right ear: Clear external ear canals and notable landmarks visualized on the tympanic membrane. Left ear:  Abnormal eardrum appearance.  Tympanometry: Right ear: Type A- Normal external ear canal volume with normal middle ear pressure and tympanic membrane compliance Left ear: Type B- Large external ear canal volume with no middle ear pressure peak or tympanic membrane compliance    Pure tone Audiometry: Right ear- Normal to moderately severe sensorineural hearing loss from 250 Hz - 8000 Hz. Left ear-  Moderately severe essentially rising to mild conductive hearing loss from 250 Hz - 8000 Hz.  Speech Audiometry: Right ear- Speech Reception Threshold (SRT) was obtained at 20 dBHL. Left ear-Speech Reception Threshold (SRT) was obtained at 30 dBHL.   Word Recognition Score Tested using NU-6 (MLV) Right ear: 100% was obtained at a presentation level of 65 dBHL with contralateral masking which is deemed as  excellent. Left ear: 100% was obtained at a presentation level of 80 dBHL with contralateral masking which is deemed as  excellent.   The hearing test results were completed under headphones and results are deemed to be of fair to good reliability. Test technique:   conventional      Recommendations: Repeat audiogram after medical care.   Latoyia Tecson MARIE LEROUX-MARTINEZ, AUD

## 2023-08-11 NOTE — Progress Notes (Signed)
 Patient ID: Barbara Flores, female   DOB: 08-30-1960, 63 y.o.   MRN: [id number]  CC: Left ear hearing loss, left ear pain  HPI:  Barbara Flores is a 63 y.o. female who presents today complaining of left ear infection and left ear pain in December 2024.  She was treated with oral and topical antibiotics twice.  Despite of treatment, she continues to have intermittent left ear pain.  She also complains of decreased left ear hearing.  The patient has a history of environmental allergies.  She is on Flonase and Claritin daily.  She also has a history of right myringotomy and tube placement and sinus surgery.  Currently she denies any otorrhea or vertigo.  Past Medical History:  Diagnosis Date   Allergy    Asthma    Diabetes mellitus 08/23/2010   GERD (gastroesophageal reflux disease)    History of exercise stress test 07-2010   normal except for elevated BP   Hyperlipidemia    Hypertension    Menopause    Rosacea     Past Surgical History:  Procedure Laterality Date   COLONOSCOPY     Eardrum surgery     HEMORRHOID SURGERY     NASAL SINUS SURGERY     OTHER SURGICAL HISTORY     female surgery after miscarriage, ? D&C or edometrial surgery   UPPER GASTROINTESTINAL ENDOSCOPY      Family History  Problem Relation Age of Onset   Diabetes Mother    Hyperlipidemia Mother    Hypertension Mother    Stroke Mother 10   Kidney failure Mother    Stomach cancer Father        deceased   Breast cancer Sister 31   Heart attack Brother 26   Colon cancer Neg Hx    Esophageal cancer Neg Hx    Rectal cancer Neg Hx     Social History:  reports that she has never smoked. She has never been exposed to tobacco smoke. She has never used smokeless tobacco. She reports current alcohol use. She reports that she does not use drugs.  Allergies:  Allergies  Allergen Reactions   Hydrocodone Nausea Only   Penicillins     vomitting   Amoxicillin Rash    Prior to Admission medications   Medication Sig  Start Date End Date Taking? Authorizing Provider  Accu-Chek Softclix Lancets lancets USE 1 LANCET TO CHECK BLOOD SUGAR IN THE MORNING, AT NOON AND AT BEDTIME 01/05/23  Yes Shamleffer, Konrad Dolores, MD  albuterol (PROVENTIL) (2.5 MG/3ML) 0.083% nebulizer solution Take 3 mLs (2.5 mg total) by nebulization every 6 (six) hours as needed for wheezing or shortness of breath. 03/06/23  Yes Birder Robson, MD  albuterol (VENTOLIN HFA) 108 (90 Base) MCG/ACT inhaler Inhale 1-2 puffs into the lungs every 6 (six) hours as needed for wheezing or shortness of breath. 05/01/23  Yes Birder Robson, MD  amLODipine (NORVASC) 2.5 MG tablet Take 1 tablet (2.5 mg total) by mouth daily. 08/06/22  Yes Sandford Craze, NP  aspirin 81 MG tablet Take 81 mg by mouth daily.   Yes [provider]  Azelaic Acid 15 % gel Apply topically 2 (two) times daily. 03/03/23  Yes [provider]  azelastine (ASTELIN) 0.1 % nasal spray Place 1 spray into both nostrils 2 (two) times daily as needed. Use in each nostril as directed 05/01/23  Yes Birder Robson, MD  Blood Glucose Monitoring Suppl DEVI 1 each by Does not apply route in  the morning, at noon, and at bedtime. May substitute to any manufacturer covered by patient's insurance. 08/06/22  Yes Sandford Craze, NP  Cholecalciferol (VITAMIN D) 50 MCG (2000 UT) CAPS Take by mouth.   Yes [provider]  ciprofloxacin-dexamethasone (CIPRODEX) OTIC suspension Place 4 drops into the left ear 2 (two) times daily for 10 days. 08/10/23 08/20/23 Yes Newman Pies, MD  empagliflozin (JARDIANCE) 25 MG TABS tablet Take 1 tablet (25 mg total) by mouth every morning. 06/02/23  Yes Shamleffer, Konrad Dolores, MD  fluticasone (FLONASE) 50 MCG/ACT nasal spray Place 2 sprays into both nostrils daily. 05/01/23  Yes Patel, Ellen Henri, MD  Fluticasone Propionate, Inhal, (FLOVENT DISKUS) 100 MCG/BLIST AEPB Take 2 puffs twice daily during flares. Rinse your mouth out after use. 12/07/20   Yes Wendling, Jilda Roche, DO  Glucose Blood (BLOOD GLUCOSE TEST STRIPS) STRP Check blood sugars three times daily 09/03/22  Yes Paz, Elita Quick E, MD  loratadine (CLARITIN) 10 MG tablet Take 10 mg by mouth daily.   Yes [provider]  losartan-hydrochlorothiazide (HYZAAR) 100-25 MG tablet Take 1 tablet by mouth daily. 06/01/23  Yes Paz, Nolon Rod, MD  meclizine (ANTIVERT) 25 MG tablet Take 1 tablet (25 mg total) by mouth 3 (three) times daily as needed for dizziness. 08/06/22  Yes Sandford Craze, NP  meloxicam (MOBIC) 15 MG tablet Take 1 tablet (15 mg total) by mouth daily as needed. 09/25/22  Yes Myra Rude, MD  Mometasone Furoate [facility] HFA) 100 MCG/ACT AERO With respiratory illness or flare ups, start Asmanex 1 puff twice daily for 1-2 weeks. 03/06/23  Yes Birder Robson, MD  nitroGLYCERIN (NITRODUR - DOSED IN MG/24 HR) 0.2 mg/hr patch Cut and apply 1/4 patch to most painful area q24h. 02/10/22  Yes Myra Rude, MD  Olopatadine HCl 0.2 % SOLN Apply 1 drop to eye daily as needed (itchy watery eyes). 03/06/23  Yes Birder Robson, MD  pantoprazole (PROTONIX) 40 MG tablet Use protonix (pantoprazole) 40 mg twice a day for 3 months followed by 40 mg once daily thereafter 06/25/22  Yes Nandigam, Kavitha V, MD  RESTASIS 0.05 % ophthalmic emulsion Place 1 drop into both eyes 2 (two) times daily. 12/26/22  Yes [provider]  rosuvastatin (CRESTOR) 20 MG tablet Take 1 tablet (20 mg total) by mouth at bedtime. 03/02/23  Yes Paz, Nolon Rod, MD  Semaglutide, 1 MG/DOSE, 4 MG/3ML SOPN Inject 1 mg as directed once a week. 06/02/23  Yes Shamleffer, Konrad Dolores, MD  tiZANidine (ZANAFLEX) 4 MG tablet Take 1 tablet (4 mg total) by mouth every 6 (six) hours as needed for muscle spasms. 09/22/22  Yes Wendling, Jilda Roche, DO  Vitamin D, Ergocalciferol, (DRISDOL) 1.25 MG (50000 UNIT) CAPS capsule Take 1 capsule (50,000 Units total) by mouth every 7 (seven) days. 06/01/23 08/24/23 Yes Paz,  Nolon Rod, MD  cetirizine (ZYRTEC ALLERGY) 10 MG tablet Take 1 tablet (10 mg total) by mouth daily. Patient not taking: Reported on 08/10/2023 05/01/23   Birder Robson, MD    Blood pressure (!) 154/81, pulse 98, height 4\' 11"  (1.499 m), weight 148 lb (67.1 kg), last menstrual period 03/08/2016, SpO2 98%. Exam: General: Communicates without difficulty, well nourished, no acute distress. Head: Normocephalic, no evidence injury, no tenderness, facial buttresses intact without stepoff. Face/sinus: No tenderness to palpation and percussion. Facial movement is normal and symmetric. Eyes: PERRL, EOMI. No scleral icterus, conjunctivae clear. Neuro: CN II exam reveals vision grossly intact.  No nystagmus  at any point of gaze. Ears: Auricles well formed without lesions.  Ear canals are intact without mass or lesion.  No erythema or edema is appreciated.  A left TM perforation is noted.  The right tympanic membrane is intact and mobile.  No ventilating tube is noted.  Nose: External evaluation reveals normal support and skin without lesions.  Dorsum is intact.  Anterior rhinoscopy reveals congested mucosa over anterior aspect of inferior turbinates and intact septum.  No purulence noted. Oral:  Oral cavity and oropharynx are intact, symmetric, without erythema or edema.  Mucosa is moist without lesions. Neck: Full range of motion without pain.  There is no significant lymphadenopathy.  No masses palpable.  Thyroid bed within normal limits to palpation.  Parotid glands and submandibular glands equal bilaterally without mass.  Trachea is midline. Neuro:  CN 2-12 grossly intact.   Her hearing test shows asymmetric left ear conductive hearing loss.  Assessment: 1.  The patient is noted to have a left tympanic membrane perforation.  The right tympanic membrane is intact and mobile. 2.  Asymmetric left ear conductive hearing loss. 3.  The tympanic membrane perforation may be secondary to her recent left otitis  media.  Plan: 1.  The physical exam findings and the hearing test results are reviewed with the patient. 2.  Continue dry ear precautions on the left side. 3.  Ciprodex eardrops 4 drops left ear twice daily for 10 days. 4.  The patient will return for reevaluation in 1 month. 3.  Geraldy Akridge W Lillis Nuttle 08/11/2023, 11:51 AM

## 2023-08-13 ENCOUNTER — Encounter: Payer: Self-pay | Admitting: Audiology

## 2023-08-21 ENCOUNTER — Ambulatory Visit: Payer: BC Managed Care – PPO | Admitting: Internal Medicine

## 2023-09-05 ENCOUNTER — Other Ambulatory Visit: Payer: Self-pay | Admitting: Internal Medicine

## 2023-10-02 ENCOUNTER — Other Ambulatory Visit: Payer: Self-pay | Admitting: Internal Medicine

## 2023-10-14 ENCOUNTER — Encounter (INDEPENDENT_AMBULATORY_CARE_PROVIDER_SITE_OTHER): Payer: Self-pay | Admitting: Otolaryngology

## 2023-10-14 ENCOUNTER — Encounter (INDEPENDENT_AMBULATORY_CARE_PROVIDER_SITE_OTHER): Payer: Self-pay

## 2023-10-14 ENCOUNTER — Ambulatory Visit (INDEPENDENT_AMBULATORY_CARE_PROVIDER_SITE_OTHER): Payer: BC Managed Care – PPO | Admitting: Otolaryngology

## 2023-10-14 VITALS — Ht 59.0 in | Wt 147.0 lb

## 2023-10-14 DIAGNOSIS — H9012 Conductive hearing loss, unilateral, left ear, with unrestricted hearing on the contralateral side: Secondary | ICD-10-CM

## 2023-10-14 DIAGNOSIS — H7292 Unspecified perforation of tympanic membrane, left ear: Secondary | ICD-10-CM | POA: Diagnosis not present

## 2023-10-14 DIAGNOSIS — H7202 Central perforation of tympanic membrane, left ear: Secondary | ICD-10-CM

## 2023-10-15 NOTE — Progress Notes (Signed)
 Patient ID: Barbara Flores, female   DOB: 07/24/1960, 63 y.o.   MRN: [id number]  Follow-up: Left ear hearing loss, left tympanic membrane perforation  HPI: The patient is a 63 year old female who returns today for her follow-up evaluation.  She was last seen in February 2025.  At that time, she was complaining of left ear hearing loss after an ear infection.  She was noted to have a left tympanic membrane perforation.  The patient was treated with Ciprodex  eardrops.  The patient returns today complaining of persistent left ear hearing difficulty.  She denies any otalgia, otorrhea, or vertigo.  Exam: General: Communicates without difficulty, well nourished, no acute distress. Head: Normocephalic, no evidence injury, no tenderness, facial buttresses intact without stepoff. Face/sinus: No tenderness to palpation and percussion. Facial movement is normal and symmetric. Eyes: PERRL, EOMI. No scleral icterus, conjunctivae clear. Neuro: CN II exam reveals vision grossly intact.  No nystagmus at any point of gaze. Ears: Auricles well formed without lesions.  Ear canals are intact without mass or lesion.  No erythema or edema is appreciated.  A small left TM perforation is noted.  The right tympanic membrane is intact and mobile.  No ventilating tube is noted.  Nose: External evaluation reveals normal support and skin without lesions.  Dorsum is intact.  Anterior rhinoscopy reveals congested mucosa over anterior aspect of inferior turbinates and intact septum.  No purulence noted. Oral:  Oral cavity and oropharynx are intact, symmetric, without erythema or edema.  Mucosa is moist without lesions. Neck: Full range of motion without pain.  There is no significant lymphadenopathy.  No masses palpable.  Thyroid  bed within normal limits to palpation.  Parotid glands and submandibular glands equal bilaterally without mass.  Trachea is midline. Neuro:  CN 2-12 grossly intact.    Assessment: 1.  The patient continues to have  a left tympanic membrane perforation.  The size of the perforation has significantly decreased. 2.  Left ear conductive hearing loss, secondary to the TM perforation.  Plan: 1.  The physical exam findings are reviewed with the patient. 2.  Continue dry ear precautions on the left side. 3.  The patient is reassured that no infection is noted today. 4.  The patient will return for reevaluation in 3 months.

## 2023-10-16 ENCOUNTER — Telehealth: Payer: Self-pay | Admitting: Internal Medicine

## 2023-10-16 NOTE — Telephone Encounter (Signed)
 Lvm for pt to call back. She has two cpes sch on different dates. Please confirm when she would like to come and cx the other appt.

## 2023-11-27 ENCOUNTER — Telehealth: Payer: Self-pay | Admitting: Pharmacy Technician

## 2023-11-27 ENCOUNTER — Other Ambulatory Visit (HOSPITAL_COMMUNITY): Payer: Self-pay

## 2023-11-27 ENCOUNTER — Ambulatory Visit (INDEPENDENT_AMBULATORY_CARE_PROVIDER_SITE_OTHER): Payer: BC Managed Care – PPO | Admitting: Internal Medicine

## 2023-11-27 ENCOUNTER — Encounter: Payer: Self-pay | Admitting: Internal Medicine

## 2023-11-27 VITALS — BP 136/84 | HR 80 | Ht 59.0 in | Wt 149.0 lb

## 2023-11-27 DIAGNOSIS — Z7984 Long term (current) use of oral hypoglycemic drugs: Secondary | ICD-10-CM | POA: Diagnosis not present

## 2023-11-27 DIAGNOSIS — E119 Type 2 diabetes mellitus without complications: Secondary | ICD-10-CM | POA: Diagnosis not present

## 2023-11-27 DIAGNOSIS — Z7985 Long-term (current) use of injectable non-insulin antidiabetic drugs: Secondary | ICD-10-CM | POA: Diagnosis not present

## 2023-11-27 LAB — POCT GLYCOSYLATED HEMOGLOBIN (HGB A1C): Hemoglobin A1C: 6.4 % — AB (ref 4.0–5.6)

## 2023-11-27 MED ORDER — TIRZEPATIDE 5 MG/0.5ML ~~LOC~~ SOAJ: 5.0000 mg | SUBCUTANEOUS | 3 refills | Status: DC

## 2023-11-27 NOTE — Telephone Encounter (Signed)
 Pharmacy Patient Advocate Encounter  Received notification from [facility] that Prior Authorization for Mounjaro 5MG /0.5ML auto-injectors has been APPROVED from 11/27/2023 to 11/26/2024   PA #/Case ID/Reference #: [id number]

## 2023-11-27 NOTE — Telephone Encounter (Signed)
 Pharmacy Patient Advocate Encounter   Received notification from CoverMyMeds that prior authorization for Mounjaro 5MG /0.5ML auto-injectors is required/requested.   Insurance verification completed.   The patient is insured through [facility] .   Per test claim: PA required; PA submitted to above mentioned insurance via CoverMyMeds Key/confirmation #/EOC [id number] Status is pending

## 2023-11-27 NOTE — Progress Notes (Signed)
 Name: Barbara Flores  MRN/ DOB: [id number], Jan 08, 1961   Age/ Sex: 63 y.o., female    PCP: Barbara Hollow, MD   Reason for Endocrinology Evaluation: Type 2 Diabetes Mellitus     Date of Initial Endocrinology Visit: 04/25/2022    PATIENT IDENTIFIER: [name] is a 63 y.o. female with a past medical history of HTN, DM and dyslipidemia. The patient presented for initial endocrinology clinic visit on 04/25/2022 for consultative assistance with her diabetes management.    HPI: [name] was    Diagnosed with DM 2018 Prior Medications tried/Intolerance: Metformin  - GI side effects            Hemoglobin A1c has ranged from 6.5% in 2021, peaking at 9.2% in 2022.   On her initial visit with me she had an A1c of 9.4%, she was on Januvia , pioglitazone , SGLT2 inhibitors were cost prohibitive and Rybelsus  was not covered by her insurance so we opted to continue pioglitazone  and Januvia  and started glipizide   She was started on Jardiance  06/2022 through PCP  She had to stop pioglitazone  due to insurance issue , stopped 06/2022   Switch Rybelsus  to Ozempic , per her request as she attributed headaches to Rybelsus  10/2022  SUBJECTIVE:   During the last visit (06/02/2023): A1c 6.4%    Today (11/27/23): [name] is here for follow-up on diabetes management, she checks her blood sugars 1 times daily.  She did have 2 episodes of BG readings of 66 Mg/DL, she felt shaky during these episodes.  Continues with  constipation - she is on stool softer  Has nausea but no vomiting  Denies genital infections   HOME DIABETES REGIMEN: Jardiance  25 mg daily  Ozempic  1 mg weekly   Statin: Yes ACE-I/ARB: Yes Prior Diabetic Education: no     METER DOWNLOAD SUMMARY: 5/15-6/13/2025 Fingerstick Blood Glucose Tests = 40 Average Number Tests/Day = 1.3 Overall Mean FS Glucose = 125 Standard Deviation = 175   BG Target % Results: % In target = 92 % Over target = 2 % Under target =  5  Hypoglycemic Events/30 Days: BG < 50 = 0 Episodes of symptomatic severe hypoglycemia = 0    DIABETIC COMPLICATIONS: Microvascular complications:   Denies: CKD,  Last eye exam: Completed 07/18/2022  Macrovascular complications:   Denies: CAD, PVD, CVA   PAST HISTORY: Past Medical History:  Past Medical History:  Diagnosis Date   Allergy     Asthma    Diabetes mellitus 08/23/2010   GERD (gastroesophageal reflux disease)    History of exercise stress test 07-2010   normal except for elevated BP   Hyperlipidemia    Hypertension    Menopause    Rosacea    Past Surgical History:  Past Surgical History:  Procedure Laterality Date   COLONOSCOPY     Eardrum surgery     HEMORRHOID SURGERY     NASAL SINUS SURGERY     OTHER SURGICAL HISTORY     female surgery after miscarriage, ? D&C or edometrial surgery   UPPER GASTROINTESTINAL ENDOSCOPY      Social History:  reports that she has never smoked. She has never been exposed to tobacco smoke. She has never used smokeless tobacco. She reports current alcohol use. She reports that she does not use drugs. Family History:  Family History  Problem Relation Age of Onset   Diabetes Mother    Hyperlipidemia Mother    Hypertension Mother    Stroke Mother 61   Kidney failure  Mother    Stomach cancer Father        deceased   Breast cancer Sister 10   Heart attack Brother 34   Colon cancer Neg Hx    Esophageal cancer Neg Hx    Rectal cancer Neg Hx      HOME MEDICATIONS: Allergies as of 11/27/2023       Reactions   Hydrocodone  Nausea Only   Penicillins    vomitting   Amoxicillin Rash        Medication List        Accurate as of November 27, 2023  7:37 AM. If you have any questions, ask your nurse or doctor.          Accu-Chek Softclix Lancets lancets USE 1 LANCET TO CHECK BLOOD SUGAR IN THE MORNING, AT NOON AND AT BEDTIME   albuterol  (2.5 MG/3ML) 0.083% nebulizer solution Commonly known as: PROVENTIL  Take 3 mLs  (2.5 mg total) by nebulization every 6 (six) hours as needed for wheezing or shortness of breath.   albuterol  108 (90 Base) MCG/ACT inhaler Commonly known as: VENTOLIN  HFA Inhale 1-2 puffs into the lungs every 6 (six) hours as needed for wheezing or shortness of breath.   amLODipine  2.5 MG tablet Commonly known as: NORVASC  Take 1 tablet (2.5 mg total) by mouth daily.   Asmanex  HFA 100 MCG/ACT Aero Generic drug: Mometasone Furoate  With respiratory illness or flare ups, start Asmanex  100mcg 1 puff twice daily for 1-2 weeks.   aspirin 81 MG tablet Take 81 mg by mouth daily.   Azelaic Acid 15 % gel Apply topically 2 (two) times daily.   azelastine  0.1 % nasal spray Commonly known as: ASTELIN  Place 1 spray into both nostrils 2 (two) times daily as needed. Use in each nostril as directed   Blood Glucose Monitoring Suppl Devi 1 each by Does not apply route in the morning, at noon, and at bedtime. May substitute to any manufacturer covered by patient's insurance.   BLOOD GLUCOSE TEST STRIPS Strp Check blood sugars three times daily   cetirizine  10 MG tablet Commonly known as: ZyrTEC  Allergy  Take 1 tablet (10 mg total) by mouth daily.   empagliflozin  25 MG Tabs tablet Commonly known as: Jardiance  Take 1 tablet (25 mg total) by mouth every morning.   Flovent  Diskus 100 MCG/BLIST Aepb Generic drug: Fluticasone  Propionate (Inhal) Take 2 puffs twice daily during flares. Rinse your mouth out after use.   fluticasone  50 MCG/ACT nasal spray Commonly known as: FLONASE  Place 2 sprays into both nostrils daily.   loratadine 10 MG tablet Commonly known as: CLARITIN Take 10 mg by mouth daily.   losartan -hydrochlorothiazide  100-25 MG tablet Commonly known as: HYZAAR Take 1 tablet by mouth daily.   meclizine  25 MG tablet Commonly known as: ANTIVERT  Take 1 tablet (25 mg total) by mouth 3 (three) times daily as needed for dizziness.   meloxicam  15 MG tablet Commonly known as:  MOBIC  Take 1 tablet (15 mg total) by mouth daily as needed.   nitroGLYCERIN  0.2 mg/hr patch Commonly known as: NITRODUR - Dosed in mg/24 hr Cut and apply 1/4 patch to most painful area q24h.   Olopatadine  HCl 0.2 % Soln Apply 1 drop to eye daily as needed (itchy watery eyes).   Ozempic  (1 MG/DOSE) 4 MG/3ML Sopn Generic drug: Semaglutide  (1 MG/DOSE) INJECT 1 MG SUBCUTANEOUSLY ONCE A WEEK   pantoprazole  40 MG tablet Commonly known as: PROTONIX  Use protonix  (pantoprazole ) 40 mg twice a day for 3 months  followed by 40 mg once daily thereafter   Restasis 0.05 % ophthalmic emulsion Generic drug: cycloSPORINE Place 1 drop into both eyes 2 (two) times daily.   rosuvastatin  20 MG tablet Commonly known as: CRESTOR  Take 1 tablet (20 mg total) by mouth at bedtime.   tiZANidine  4 MG tablet Commonly known as: Zanaflex  Take 1 tablet (4 mg total) by mouth every 6 (six) hours as needed for muscle spasms.   Vitamin D  50 MCG (2000 UT) Caps Take by mouth.         ALLERGIES: Allergies  Allergen Reactions   Hydrocodone  Nausea Only   Penicillins     vomitting   Amoxicillin Rash     REVIEW OF SYSTEMS: A comprehensive ROS was conducted with the patient and is negative except as per HPI and below:      OBJECTIVE:   VITAL SIGNS: BP 136/84 (BP Location: Left Arm, Patient Position: Sitting, Cuff Size: Normal)   Pulse 80   Ht 4' 11 (1.499 m)   Wt 149 lb (67.6 kg)   LMP 03/08/2016   SpO2 98%   BMI 30.09 kg/m    PHYSICAL EXAM:  General: Pt appears well and is in NAD  Lungs: Clear with good BS bilat   Heart: RRR   Abdomen:  soft, nontender  Extremities:  Lower extremities - No pretibial edema.   Neuro: MS is good with appropriate affect, pt is alert and Ox3    DM foot exam: 16/13/2025  The skin of the feet is intact without sores or ulcerations. The pedal pulses are 2+ on right and 2+ on left. The sensation is intact to a screening 5.07, 10 gram monofilament  bilaterally     DATA REVIEWED:  Lab Results  Component Value Date   HGBA1C 6.4 (A) 11/27/2023   HGBA1C 6.4 (A) 06/02/2023   HGBA1C 7.5 (A) 09/19/2022    Latest Reference Range & Units 05/29/23 08:36  Sodium 135 - 145 mEq/L 140  Potassium 3.5 - 5.1 mEq/L 3.4 (L)  Chloride 96 - 112 mEq/L 104  CO2 19 - 32 mEq/L 24  Glucose 70 - 99 mg/dL 161 (H)  BUN 6 - 23 mg/dL 11  Creatinine 0.96 - 0.45 mg/dL 4.09  Calcium  8.4 - 10.5 mg/dL 9.3  AST 0 - 37 U/L 21  ALT 0 - 35 U/L 26  GFR >60.00 mL/min 85.26  Total CHOL/HDL Ratio  2  Cholesterol 0 - 200 mg/dL 811  HDL Cholesterol >91.47 mg/dL 82.95  LDL (calc) 0 - 99 mg/dL 77  NonHDL  62.13  Triglycerides 0.0 - 149.0 mg/dL 08.6  VLDL 0.0 - 57.8 mg/dL 46.9    ASSESSMENT / PLAN / RECOMMENDATIONS:   1) Type 2 Diabetes Mellitus, Optimally  controlled, Without complications - Most recent A1c of 6.4 %. Goal A1c < 7.0 %.    - Patient is having severe constipation and nausea with Ozempic , we have discussed switching to Mounjaro - She did have 2 episodes of low BG's at 66 Mg/DL, we did discuss proper technique of checking glucose and to verify these readings as none of the Ozempic  nor Jardiance  will cause hypoglycemia  MEDICATIONS:   Switch Ozempic  to Mounjaro 5 mg weekly Continue Jardiance  25 mg daily   EDUCATION / INSTRUCTIONS: BG monitoring instructions: Patient is instructed to check her blood sugars 2-3 times a week. Call [facility] clinic if: BG persistently < 70  I reviewed the Rule of 15 for the treatment of hypoglycemia in detail with the  patient. Literature supplied.   2) Diabetic complications:  Eye: Does not have known diabetic retinopathy.  Neuro/ Feet: Does not have known diabetic peripheral neuropathy. Renal: Patient does not have known baseline CKD.     Follow-up in 6 months  Signed electronically by: Natale Bail, MD  [facility]  [facility] [address] [place] [place], [place] [address] Phone: [phone] FAX: [phone]   CC: Barbara Hollow, MD [address] [facility] [address] [place] [address] Phone: [phone]  Fax: [phone]    Return to Endocrinology clinic as below: Future Appointments  Date Time Provider Department Center  01/22/2024 11:20 AM Reynold Caves, MD [facility] None  01/22/2024  1:20 PM Barbara Hollow, MD [id number]

## 2023-11-27 NOTE — Patient Instructions (Addendum)
 Switch Ozempic  to Mounjaro 5 mg once weekly   Continue Jardiance  25 mg daily      HOW TO TREAT LOW BLOOD SUGARS (Blood sugar LESS THAN 70 MG/DL) Please follow the RULE OF 15 for the treatment of hypoglycemia treatment (when your (blood sugars are less than 70 mg/dL)   STEP 1: Take 15 grams of carbohydrates when your blood sugar is low, which includes:  3-4 GLUCOSE TABS  OR 3-4 OZ OF JUICE OR REGULAR SODA OR ONE TUBE OF GLUCOSE GEL    STEP 2: RECHECK blood sugar in 15 MINUTES STEP 3: If your blood sugar is still low at the 15 minute recheck --> then, go back to STEP 1 and treat AGAIN with another 15 grams of carbohydrates.

## 2023-11-30 ENCOUNTER — Encounter: Payer: BC Managed Care – PPO | Admitting: Internal Medicine

## 2023-12-02 ENCOUNTER — Other Ambulatory Visit: Payer: Self-pay | Admitting: Internal Medicine

## 2023-12-15 DIAGNOSIS — E119 Type 2 diabetes mellitus without complications: Secondary | ICD-10-CM | POA: Diagnosis not present

## 2024-01-15 DIAGNOSIS — E119 Type 2 diabetes mellitus without complications: Secondary | ICD-10-CM | POA: Diagnosis not present

## 2024-01-21 ENCOUNTER — Encounter: Payer: Self-pay | Admitting: Internal Medicine

## 2024-01-22 ENCOUNTER — Encounter (INDEPENDENT_AMBULATORY_CARE_PROVIDER_SITE_OTHER): Payer: Self-pay | Admitting: Otolaryngology

## 2024-01-22 ENCOUNTER — Ambulatory Visit (INDEPENDENT_AMBULATORY_CARE_PROVIDER_SITE_OTHER): Admitting: Otolaryngology

## 2024-01-22 ENCOUNTER — Encounter: Admitting: Internal Medicine

## 2024-01-22 VITALS — BP 135/83 | HR 86

## 2024-01-22 DIAGNOSIS — H9012 Conductive hearing loss, unilateral, left ear, with unrestricted hearing on the contralateral side: Secondary | ICD-10-CM | POA: Diagnosis not present

## 2024-01-22 DIAGNOSIS — H7292 Unspecified perforation of tympanic membrane, left ear: Secondary | ICD-10-CM | POA: Diagnosis not present

## 2024-01-22 DIAGNOSIS — H7202 Central perforation of tympanic membrane, left ear: Secondary | ICD-10-CM

## 2024-01-22 DIAGNOSIS — H6122 Impacted cerumen, left ear: Secondary | ICD-10-CM

## 2024-01-24 DIAGNOSIS — H6122 Impacted cerumen, left ear: Secondary | ICD-10-CM | POA: Insufficient documentation

## 2024-01-24 NOTE — Progress Notes (Signed)
 Patient ID: Barbara Flores, female   DOB: 1961/03/12, 63 y.o.   MRN: [id number]  Follow-up: Left tympanic membrane perforation, left ear conductive hearing loss  HPI: The patient is a 63 year old female who returns today for her follow-up evaluation.  The patient was previously noted to have a left tympanic membrane perforation, secondary to an acute ear infection.  Her hearing test showed asymmetric left ear conductive hearing loss.  The patient was placed on dry ear precaution.  The patient returns today reporting no recurrent ear infection.  She denies any significant change in her hearing.  She still has hearing difficulty on the left side.  Currently she denies any otalgia, otorrhea, or vertigo.  Exam: General: Communicates without difficulty, well nourished, no acute distress. Head: Normocephalic, no evidence injury, no tenderness, facial buttresses intact without stepoff. Face/sinus: No tenderness to palpation and percussion. Facial movement is normal and symmetric. Eyes: PERRL, EOMI. No scleral icterus, conjunctivae clear. Neuro: CN II exam reveals vision grossly intact.  No nystagmus at any point of gaze. Ears: Auricles well formed without lesions.  Left ear cerumen impaction.  The right ear canal and tympanic membrane are normal.  Nose: External evaluation reveals normal support and skin without lesions.  Dorsum is intact.  Anterior rhinoscopy reveals congested mucosa over anterior aspect of inferior turbinates and intact septum.  No purulence noted. Oral:  Oral cavity and oropharynx are intact, symmetric, without erythema or edema.  Mucosa is moist without lesions. Neck: Full range of motion without pain.  There is no significant lymphadenopathy.  No masses palpable.  Thyroid  bed within normal limits to palpation.  Parotid glands and submandibular glands equal bilaterally without mass.  Trachea is midline. Neuro:  CN 2-12 grossly intact.   Procedure: Left ear cerumen disimpaction Anesthesia:  None Description: Under the operating microscope, the cerumen is carefully removed with a combination of cerumen currette, alligator forceps, and suction catheters.  After the cerumen is removed, a 30% left TM perforation is noted. No mass, erythema, or lesions. The patient tolerated the procedure well.   Assessment: 1.  Left ear cerumen impaction.  After the cerumen removal procedure, a 30% left TM perforation is noted.  No drainage is noted. 2.  The right ear canal, tympanic membrane, and middle ear space are normal. 3.  Subjectively stable left ear conductive hearing loss.  Plan: 1.  Otomicroscopy with left ear cerumen disimpaction. 2.  The physical exam findings are reviewed with the patient. 3.  Continue dry ear precautions on the left side. 4.  The surgical option of left tympanoplasty to close the TM perforation is discussed.  The risk, benefits, and details of the procedure are reviewed. 5.  The patient would like to proceed with the procedure.  We will schedule the procedure in accordance with the patient's schedule.

## 2024-02-04 DIAGNOSIS — Z1231 Encounter for screening mammogram for malignant neoplasm of breast: Secondary | ICD-10-CM | POA: Diagnosis not present

## 2024-02-04 LAB — HM MAMMOGRAPHY

## 2024-02-08 ENCOUNTER — Encounter: Payer: Self-pay | Admitting: Obstetrics & Gynecology

## 2024-02-15 DIAGNOSIS — E119 Type 2 diabetes mellitus without complications: Secondary | ICD-10-CM | POA: Diagnosis not present

## 2024-03-07 ENCOUNTER — Telehealth: Payer: Self-pay | Admitting: Internal Medicine

## 2024-03-07 NOTE — Telephone Encounter (Signed)
Patient notified and agrees with recommendations.

## 2024-03-07 NOTE — Telephone Encounter (Signed)
 Patient is calling to say that when she gave herself the injection for the tirzepatide  (MOUNJARO ) 5 MG/0.5ML Pen  yesterday Sunday, 03/06/2024,that the injectio site became red and itchy.  Patient states that it is the same way today and would like to know \\what  she needs to do.

## 2024-03-14 ENCOUNTER — Ambulatory Visit (INDEPENDENT_AMBULATORY_CARE_PROVIDER_SITE_OTHER): Admitting: Internal Medicine

## 2024-03-14 ENCOUNTER — Encounter: Payer: Self-pay | Admitting: Internal Medicine

## 2024-03-14 VITALS — BP 132/82 | HR 82 | Temp 98.1°F | Resp 16 | Ht 59.0 in | Wt 142.5 lb

## 2024-03-14 DIAGNOSIS — E559 Vitamin D deficiency, unspecified: Secondary | ICD-10-CM | POA: Diagnosis not present

## 2024-03-14 DIAGNOSIS — Z23 Encounter for immunization: Secondary | ICD-10-CM

## 2024-03-14 DIAGNOSIS — E119 Type 2 diabetes mellitus without complications: Secondary | ICD-10-CM

## 2024-03-14 DIAGNOSIS — Z7984 Long term (current) use of oral hypoglycemic drugs: Secondary | ICD-10-CM

## 2024-03-14 DIAGNOSIS — I10 Essential (primary) hypertension: Secondary | ICD-10-CM | POA: Diagnosis not present

## 2024-03-14 DIAGNOSIS — E785 Hyperlipidemia, unspecified: Secondary | ICD-10-CM | POA: Diagnosis not present

## 2024-03-14 DIAGNOSIS — Z780 Asymptomatic menopausal state: Secondary | ICD-10-CM

## 2024-03-14 DIAGNOSIS — Z0000 Encounter for general adult medical examination without abnormal findings: Secondary | ICD-10-CM | POA: Diagnosis not present

## 2024-03-14 NOTE — Patient Instructions (Addendum)
 You got a pneumonia shot today. Please consider COVID-vaccine Take over-the-counter vitamin D  at least 2000 units every day   Check the  blood pressure regularly Blood pressure goal:  between 110/65 and  135/85. If it is consistently higher or lower, let me know    GO TO THE LAB :  Get the blood work   Your results will be posted on MyChart with my comments  Go to the front desk for the checkout Please make an appointment for a checkup in 6 months   Schedule a bone density test downstairs     YOUR PLAN:   HYPERTENSION: Your blood pressure is well-controlled  -Continue taking losartan  and amlodipine  as prescribed.  TYPE 2 DIABETES MELLITUS: Your diabetes is being managed without insulin. You had an allergic reaction to Mounjaro  and experienced constipation with it. -Continue taking Jardiance  as prescribed. -We will check your A1c and microalbumin levels.  HYPERLIPIDEMIA: Your cholesterol levels are being managed with rosuvastatin . -Continue taking rosuvastatin  as prescribed. -We will check your fasting lipid panel (FLP).  ASTHMA: Your asthma is well-controlled with your current medications. -Continue using Asmanex  for maintenance. -Use albuterol  as needed for any asthma symptoms.

## 2024-03-14 NOTE — Progress Notes (Unsigned)
 Subjective:    Patient ID: Barbara Flores, female    DOB: [birth year], 62 y.o.   MRN: [id number]  DOS:  03/14/2024 Type of visit - description: CPX  Discussed the use of AI scribe software for clinical note transcription with the patient, who gave verbal consent to proceed.  History of Present Illness   - Weight loss attributed to dietary changes - No associated gastrointestinal or systemic symptoms  Diabetes mellitus management and medication intolerance - Previously prescribed Mounjaro , discontinued due to allergic reaction at injection site - Experienced constipation while on Mounjaro , managed with stool softener - Ozempic  trial was ineffective - Currently managed with Jardiance  - No current symptoms of hyperglycemia or hypoglycemia  Hypertension management - Currently taking losartan  and amlodipine  - No symptoms of chest pain, shortness of breath, or other cardiovascular complaints  Asthma management - Uses Asmanex  and Flovent  as needed - No current respiratory symptoms or difficulty breathing      Review of Systems  Other than above, a 14 point review of systems is negative     Past Medical History:  Diagnosis Date   Allergy     Asthma    Diabetes mellitus 08/23/2010   GERD (gastroesophageal reflux disease)    History of exercise stress test 07-2010   normal except for elevated BP   Hyperlipidemia    Hypertension    Menopause    Rosacea     Past Surgical History:  Procedure Laterality Date   COLONOSCOPY     Eardrum surgery     HEMORRHOID SURGERY     NASAL SINUS SURGERY     OTHER SURGICAL HISTORY     female surgery after miscarriage, ? D&C or edometrial surgery   UPPER GASTROINTESTINAL ENDOSCOPY      Current Outpatient Medications  Medication Instructions   Accu-Chek Softclix Lancets lancets USE 1 LANCET TO CHECK BLOOD SUGAR IN THE MORNING, AT NOON AND AT BEDTIME   albuterol  (PROVENTIL ) 2.5 mg, Nebulization, Every 6 hours PRN   albuterol  (VENTOLIN  HFA)  108 (90 Base) MCG/ACT inhaler 1-2 puffs, Inhalation, Every 6 hours PRN   amLODipine  (NORVASC ) 2.5 mg, Oral, Daily   aspirin 81 mg, Daily   Azelaic Acid 15 % gel 2 times daily   azelastine  (ASTELIN ) 0.1 % nasal spray 1 spray, Each Nare, 2 times daily PRN, Use in each nostril as directed   Blood Glucose Monitoring Suppl DEVI 1 each, Does not apply, 3 times daily, May substitute to any manufacturer covered by patient's insurance.   cetirizine  (ZYRTEC  ALLERGY ) 10 mg, Oral, Daily   Cholecalciferol (VITAMIN D ) 50 MCG (2000 UT) CAPS Take by mouth.   empagliflozin  (JARDIANCE ) 25 mg, Oral, Every morning   fluticasone  (FLONASE ) 50 MCG/ACT nasal spray 2 sprays, Each Nare, Daily   Fluticasone  Propionate, Inhal, (FLOVENT  DISKUS) 100 MCG/BLIST AEPB Take 2 puffs twice daily during flares. Rinse your mouth out after use.   Glucose Blood (BLOOD GLUCOSE TEST STRIPS) STRP Check blood sugars three times daily   loratadine (CLARITIN) 10 mg, Daily   losartan -hydrochlorothiazide  (HYZAAR) 100-25 MG tablet 1 tablet, Oral, Daily   meclizine  (ANTIVERT ) 25 mg, Oral, 3 times daily PRN   Mometasone Furoate  (ASMANEX  HFA) 100 MCG/ACT AERO With respiratory illness or flare ups, start Asmanex  100mcg 1 puff twice daily for 1-2 weeks.   Olopatadine  HCl 0.2 % SOLN 1 drop, Ophthalmic, Daily PRN   pantoprazole  (PROTONIX ) 40 MG tablet Use protonix  (pantoprazole ) 40 mg twice a day for 3 months followed by 40 mg once  daily thereafter   RESTASIS 0.05 % ophthalmic emulsion 1 drop, 2 times daily   rosuvastatin  (CRESTOR ) 20 mg, Oral, Daily at bedtime       Objective:   Physical Exam BP 132/82   Pulse 82   Temp 98.1 F (36.7 C) (Oral)   Resp 16   Ht 4' 11 (1.499 m)   Wt 142 lb 8 oz (64.6 kg)   LMP 03/08/2016   SpO2 99%   BMI 28.78 kg/m  General: Well developed, NAD, BMI noted Neck: No  thyromegaly  HEENT:  Normocephalic . Face symmetric, atraumatic Lungs:  CTA B Normal respiratory effort, no intercostal retractions, no  accessory muscle use. Heart: RRR,  no murmur.  Abdomen:  Not distended, soft, non-tender. No rebound or rigidity.   DM foot exam: No edema, good pedal pulses, pinprick examination normal. She has a mole at the great toe, looks like blue mole Skin: Exposed areas without rash. Not pale. Not jaundice Neurologic:  alert & oriented X3.  Speech normal, gait appropriate for age and unassisted Strength symmetric and appropriate for age.  Psych: Cognition and judgment appear intact.  Cooperative with normal attention span and concentration.  Behavior appropriate. No anxious or depressed appearing.     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  PNM 20 today -  s/p shingrix x 2 and hep immunization done  @ [facility] per pt - Had a flu shot - Vaccines I advise:  COVID booster. -CCS:  Colonoscopy 07-2015 (-), + IFOB 12/2021 : Had a EGD and colonoscopy 06-2022, next per GI. -Female care per gynecologist    sees gyn regulalrly, PAP 2022;  MMG 01/2024 ([facility])  -Lifestyle: Doing very well with diet, taking walks regularly. -Bone health: Menopause at age 64, rec DEXA (not pursued last year), vitamin D  and stay active - Labs: See orders - Healthcare POA: Information provided  Other issues addressed HTN Blood pressure is well-controlled at 121/78 mmHg. No chest pain or difficulty breathing reported. - Continue losartan -HCT and amlodipine  Type 2 diabetes  mellitus Diabetes management is per endocrinology. On Jardiance .  Check micro Hyperlipidemia Hyperlipidemia management ongoing with rosuvastatin . - Check FLP Vitamin D  deficiency: Not taking OTC's consistently, recommend to do, check levels Asthma Asthma is well-controlled. Asmanex  is used for maintenance, and albuterol  is used as needed. - Continue the same RTC 6 months

## 2024-03-15 ENCOUNTER — Encounter: Payer: Self-pay | Admitting: Internal Medicine

## 2024-03-15 LAB — COMPREHENSIVE METABOLIC PANEL WITH GFR
ALT: 22 U/L (ref 0–35)
AST: 21 U/L (ref 0–37)
Albumin: 4.9 g/dL (ref 3.5–5.2)
Alkaline Phosphatase: 74 U/L (ref 39–117)
BUN: 11 mg/dL (ref 6–23)
CO2: 25 meq/L (ref 19–32)
Calcium: 9.6 mg/dL (ref 8.4–10.5)
Chloride: 105 meq/L (ref 96–112)
Creatinine, Ser: 0.73 mg/dL (ref 0.40–1.20)
GFR: 88.98 mL/min (ref >60.00)
Glucose, Bld: 103 mg/dL — ABNORMAL HIGH (ref 70–99)
Potassium: 3.8 meq/L (ref 3.5–5.1)
Sodium: 139 meq/L (ref 135–145)
Total Bilirubin: 0.4 mg/dL (ref 0.2–1.2)
Total Protein: 7.8 g/dL (ref 6.0–8.3)

## 2024-03-15 LAB — CBC WITH DIFFERENTIAL/PLATELET
Basophils Absolute: 0 K/uL (ref 0.0–0.1)
Basophils Relative: 0.7 % (ref 0.0–3.0)
Eosinophils Absolute: 0.1 K/uL (ref 0.0–0.7)
Eosinophils Relative: 1.8 % (ref 0.0–5.0)
HCT: 43.5 % (ref 36.0–46.0)
Hemoglobin: 14.7 g/dL (ref 12.0–15.0)
Lymphocytes Relative: 37.9 % (ref 12.0–46.0)
Lymphs Abs: 2.4 K/uL (ref 0.7–4.0)
MCHC: 33.8 g/dL (ref 30.0–36.0)
MCV: 85.1 fl (ref 78.0–100.0)
Monocytes Absolute: 0.4 K/uL (ref 0.1–1.0)
Monocytes Relative: 7.2 % (ref 3.0–12.0)
Neutro Abs: 3.3 K/uL (ref 1.4–7.7)
Neutrophils Relative %: 52.4 % (ref 43.0–77.0)
Platelets: 219 K/uL (ref 150.0–400.0)
RBC: 5.11 Mil/uL (ref 3.87–5.11)
RDW: 14.2 % (ref 11.5–15.5)
WBC: 6.3 K/uL (ref 4.0–10.5)

## 2024-03-15 LAB — LIPID PANEL
Cholesterol: 155 mg/dL (ref 0–200)
HDL: 53.7 mg/dL (ref >39.00)
LDL Cholesterol: 70 mg/dL (ref 0–99)
NonHDL: 100.9
Total CHOL/HDL Ratio: 3
Triglycerides: 154 mg/dL — ABNORMAL HIGH (ref 0.0–149.0)
VLDL: 30.8 mg/dL (ref 0.0–40.0)

## 2024-03-15 LAB — MICROALBUMIN / CREATININE URINE RATIO
Creatinine,U: 23.2 mg/dL
Microalb Creat Ratio: UNDETERMINED mg/g (ref 0.0–30.0)
Microalb, Ur: <0.7 mg/dL

## 2024-03-15 LAB — VITAMIN D 25 HYDROXY (VIT D DEFICIENCY, FRACTURES): VITD: 13.98 ng/mL — ABNORMAL LOW (ref 30.00–100.00)

## 2024-03-15 NOTE — Assessment & Plan Note (Signed)
 Here for CPX - Td 2022; - pnm shot 2015, prevnar 2016.  PNM 20 today -  s/p shingrix x 2 and hep immunization done  @ [facility] per pt - Had a flu shot - Vaccines I advise:  COVID booster. -CCS:  Colonoscopy 07-2015 (-), + IFOB 12/2021 : Had a EGD and colonoscopy 06-2022, next per GI. -Female care per gynecologist    sees gyn regulalrly, PAP 2022;  MMG 01/2024 ([facility])  -Lifestyle: Doing very well with diet, taking walks regularly. -Bone health: Menopause at age 63, rec DEXA (not pursued last year), vitamin D  and stay active - Labs: See orders - Healthcare POA: Information provided

## 2024-03-15 NOTE — Assessment & Plan Note (Signed)
 Here for CPX   Other issues addressed HTN Blood pressure is well-controlled at 121/78 mmHg. No chest pain or difficulty breathing reported. - Continue losartan -HCT and amlodipine  Type 2 diabetes mellitus Diabetes management is per endocrinology. On Jardiance .  Check micro Hyperlipidemia Hyperlipidemia management ongoing with rosuvastatin . - Check FLP Vitamin D  deficiency: Not taking OTC's consistently, recommend to do, check levels Asthma Asthma is well-controlled. Asmanex  is used for maintenance, and albuterol  is used as needed. - Continue the same RTC 6 months

## 2024-03-16 DIAGNOSIS — E119 Type 2 diabetes mellitus without complications: Secondary | ICD-10-CM | POA: Diagnosis not present

## 2024-03-17 ENCOUNTER — Ambulatory Visit: Payer: Self-pay | Admitting: Internal Medicine

## 2024-04-13 ENCOUNTER — Telehealth: Payer: Self-pay

## 2024-04-13 NOTE — Telephone Encounter (Signed)
 Patient states that she has stopped taking the Mounjaro  for 3 weeks due continued side effects. Would like to know if there is another medication she can try.

## 2024-04-16 DIAGNOSIS — E119 Type 2 diabetes mellitus without complications: Secondary | ICD-10-CM | POA: Diagnosis not present

## 2024-04-21 ENCOUNTER — Encounter (INDEPENDENT_AMBULATORY_CARE_PROVIDER_SITE_OTHER): Admitting: Otolaryngology

## 2024-04-26 DIAGNOSIS — H9012 Conductive hearing loss, unilateral, left ear, with unrestricted hearing on the contralateral side: Secondary | ICD-10-CM | POA: Diagnosis not present

## 2024-04-26 DIAGNOSIS — H7202 Central perforation of tympanic membrane, left ear: Secondary | ICD-10-CM | POA: Diagnosis not present

## 2024-04-26 HISTORY — PX: TYMPANOPLASTY WITH GRAFT: SHX6567

## 2024-05-03 ENCOUNTER — Encounter (INDEPENDENT_AMBULATORY_CARE_PROVIDER_SITE_OTHER): Payer: Self-pay | Admitting: Otolaryngology

## 2024-05-03 ENCOUNTER — Ambulatory Visit (INDEPENDENT_AMBULATORY_CARE_PROVIDER_SITE_OTHER): Admitting: Otolaryngology

## 2024-05-03 VITALS — BP 125/75 | HR 84 | Temp 98.0°F | Wt 145.0 lb

## 2024-05-03 DIAGNOSIS — H7202 Central perforation of tympanic membrane, left ear: Secondary | ICD-10-CM

## 2024-05-03 DIAGNOSIS — Z09 Encounter for follow-up examination after completed treatment for conditions other than malignant neoplasm: Secondary | ICD-10-CM

## 2024-05-03 NOTE — Progress Notes (Signed)
 Patient ID: Barbara Flores, female   DOB: 02-02-1961, 63 y.o.   MRN: [id number]  No issues postop.  The left neotympanum is intact.  Continue dry ear precautions on the left side.  Recheck in 3 to 4 months.

## 2024-05-09 ENCOUNTER — Ambulatory Visit: Admitting: Internal Medicine

## 2024-05-09 ENCOUNTER — Encounter: Payer: Self-pay | Admitting: Internal Medicine

## 2024-05-09 VITALS — BP 152/82 | Ht 59.0 in | Wt 144.0 lb

## 2024-05-09 DIAGNOSIS — Z7984 Long term (current) use of oral hypoglycemic drugs: Secondary | ICD-10-CM

## 2024-05-09 DIAGNOSIS — E119 Type 2 diabetes mellitus without complications: Secondary | ICD-10-CM | POA: Diagnosis not present

## 2024-05-09 DIAGNOSIS — Z7985 Long-term (current) use of injectable non-insulin antidiabetic drugs: Secondary | ICD-10-CM

## 2024-05-09 LAB — POCT GLYCOSYLATED HEMOGLOBIN (HGB A1C): Hemoglobin A1C: 6.6 % — AB (ref 4.0–5.6)

## 2024-05-09 MED ORDER — EMPAGLIFLOZIN 25 MG PO TABS
25.0000 mg | ORAL_TABLET | Freq: Every morning | ORAL | 3 refills | Status: AC
Start: 2024-05-09 — End: ?

## 2024-05-09 MED ORDER — TRULICITY 0.75 MG/0.5ML ~~LOC~~ SOAJ: 0.7500 mg | SUBCUTANEOUS | 3 refills | Status: AC

## 2024-05-09 NOTE — Progress Notes (Signed)
 Name: Barbara Flores  MRN/ DOB: [id number], 1960-09-29   Age/ Sex: 63 y.o., female    PCP: Amon Aloysius BRAVO, MD   Reason for Endocrinology Evaluation: Type 2 Diabetes Mellitus     Date of Initial Endocrinology Visit: 04/25/2022    PATIENT IDENTIFIER: Barbara Flores is a 63 y.o. female with a past medical history of HTN, DM and dyslipidemia. The patient presented for initial endocrinology clinic visit on 04/25/2022 for consultative assistance with her diabetes management.    HPI: Barbara Flores was    Diagnosed with DM 2018 Prior Medications tried/Intolerance: Metformin  - GI side effects            Hemoglobin A1c has ranged from 6.5% in 2021, peaking at 9.2% in 2022.   On her initial visit with me she had an A1c of 9.4%, she was on Januvia , pioglitazone , SGLT2 inhibitors were cost prohibitive and Rybelsus  was not covered by her insurance so we opted to continue pioglitazone  and Januvia  and started glipizide   She was started on Jardiance  06/2022 through PCP  She had to stop pioglitazone  due to insurance issue , stopped 06/2022   Switch Rybelsus  to Ozempic , per her request as she attributed headaches to Rybelsus  10/2022   Switched Ozempic  to Mounjaro  in June, 2025 due to severe constipation and nausea with Ozempic , but she developed a local reaction at the injection site to Mounjaro , she subsequently discontinued Mounjaro  in October, 2025  SUBJECTIVE:   During the last visit (11/27/2023): A1c 6.4%    Today (05/09/24): Barbara Flores is here for follow-up on diabetes management, she checks her blood sugars 1 times daily.  No hypoglycemia Patient has been noted weight loss She recently returned from a vacation Continue with occasional constipation - on miralax as well  No nausea  No Uti's    HOME DIABETES REGIMEN: Jardiance  25 mg daily    Statin: Yes ACE-I/ARB: Yes Prior Diabetic Education: no     METER DOWNLOAD SUMMARY: 11/10-11/24/2025 Average Number Tests/Day = 0.9 Overall  Mean FS Glucose = 139  Lowest: 113 Highest :167    Hypoglycemic Events/30 Days: BG < 50 = 0 Episodes of symptomatic severe hypoglycemia = 0    DIABETIC COMPLICATIONS: Microvascular complications:   Denies: CKD,  Last eye exam: Completed 07/18/2022  Macrovascular complications:   Denies: CAD, PVD, CVA   PAST HISTORY: Past Medical History:  Past Medical History:  Diagnosis Date   Allergy     Asthma    Diabetes mellitus 08/23/2010   GERD (gastroesophageal reflux disease)    History of exercise stress test 07-2010   normal except for elevated BP   Hyperlipidemia    Hypertension    Menopause    Rosacea    Past Surgical History:  Past Surgical History:  Procedure Laterality Date   COLONOSCOPY     Eardrum surgery     HEMORRHOID SURGERY     NASAL SINUS SURGERY     OTHER SURGICAL HISTORY     female surgery after miscarriage, ? D&C or edometrial surgery   TYMPANOPLASTY WITH GRAFT Left 04/26/2024   UPPER GASTROINTESTINAL ENDOSCOPY      Social History:  reports that she has never smoked. She has never been exposed to tobacco smoke. She has never used smokeless tobacco. She reports current alcohol use. She reports that she does not use drugs. Family History:  Family History  Problem Relation Age of Onset   Diabetes Mother    Hyperlipidemia Mother    Hypertension Mother  Stroke Mother 25   Kidney failure Mother    Stomach cancer Father        deceased   Breast cancer Sister 20   Heart attack Brother 55   Colon cancer Neg Hx    Esophageal cancer Neg Hx    Rectal cancer Neg Hx      HOME MEDICATIONS: Allergies as of 05/09/2024       Reactions   Hydrocodone  Nausea Only   Mounjaro  [tirzepatide ]    Penicillins    vomitting   Amoxicillin Rash        Medication List        Accurate as of May 09, 2024  8:40 AM. If you have any questions, ask your nurse or doctor.          Accu-Chek Softclix Lancets lancets USE 1 LANCET TO CHECK BLOOD SUGAR IN THE  MORNING, AT NOON AND AT BEDTIME   albuterol  (2.5 MG/3ML) 0.083% nebulizer solution Commonly known as: PROVENTIL  Take 3 mLs (2.5 mg total) by nebulization every 6 (six) hours as needed for wheezing or shortness of breath.   albuterol  108 (90 Base) MCG/ACT inhaler Commonly known as: VENTOLIN  HFA Inhale 1-2 puffs into the lungs every 6 (six) hours as needed for wheezing or shortness of breath.   amLODipine  2.5 MG tablet Commonly known as: NORVASC  Take 1 tablet (2.5 mg total) by mouth daily.   Asmanex  HFA 100 MCG/ACT Aero Generic drug: Mometasone Furoate  With respiratory illness or flare ups, start Asmanex  100mcg 1 puff twice daily for 1-2 weeks.   aspirin 81 MG tablet Take 81 mg by mouth daily.   Azelaic Acid 15 % gel Apply topically 2 (two) times daily.   azelastine  0.1 % nasal spray Commonly known as: ASTELIN  Place 1 spray into both nostrils 2 (two) times daily as needed. Use in each nostril as directed   Blood Glucose Monitoring Suppl Devi 1 each by Does not apply route in the morning, at noon, and at bedtime. May substitute to any manufacturer covered by patient's insurance.   BLOOD GLUCOSE TEST STRIPS Strp Check blood sugars three times daily   cetirizine  10 MG tablet Commonly known as: ZyrTEC  Allergy  Take 1 tablet (10 mg total) by mouth daily.   empagliflozin  25 MG Tabs tablet Commonly known as: Jardiance  Take 1 tablet (25 mg total) by mouth every morning.   Flovent  Diskus 100 MCG/BLIST Aepb Generic drug: Fluticasone  Propionate (Inhal) Take 2 puffs twice daily during flares. Rinse your mouth out after use. What changed:  how much to take how to take this when to take this reasons to take this   fluticasone  50 MCG/ACT nasal spray Commonly known as: FLONASE  Place 2 sprays into both nostrils daily.   loratadine 10 MG tablet Commonly known as: CLARITIN Take 10 mg by mouth daily.   losartan -hydrochlorothiazide  100-25 MG tablet Commonly known as: HYZAAR Take  1 tablet by mouth daily.   meclizine  25 MG tablet Commonly known as: ANTIVERT  Take 1 tablet (25 mg total) by mouth 3 (three) times daily as needed for dizziness.   Olopatadine  HCl 0.2 % Soln Apply 1 drop to eye daily as needed (itchy watery eyes).   pantoprazole  40 MG tablet Commonly known as: PROTONIX  Use protonix  (pantoprazole ) 40 mg twice a day for 3 months followed by 40 mg once daily thereafter   Restasis 0.05 % ophthalmic emulsion Generic drug: cycloSPORINE Place 1 drop into both eyes 2 (two) times daily.   rosuvastatin  20 MG tablet Commonly known  as: CRESTOR  Take 1 tablet (20 mg total) by mouth at bedtime.   Vitamin D  50 MCG (2000 UT) Caps Take by mouth.         ALLERGIES: Allergies  Allergen Reactions   Hydrocodone  Nausea Only   Mounjaro  [Tirzepatide ]    Penicillins     vomitting   Amoxicillin Rash     REVIEW OF SYSTEMS: A comprehensive ROS was conducted with the patient and is negative except as per HPI and below:      OBJECTIVE:   VITAL SIGNS: BP (!) 152/82   Ht 4' 11 (1.499 m)   Wt 144 lb (65.3 kg)   LMP 03/08/2016   BMI 29.08 kg/m    PHYSICAL EXAM:  General: Pt appears well and is in NAD  Lungs: Clear with good BS bilat   Heart: RRR   Abdomen:  soft, nontender  Extremities:  Lower extremities - No pretibial edema.   Neuro: MS is good with appropriate affect, pt is alert and Ox3    DM foot exam: 02/2024 per PCP's office     DATA REVIEWED:  Lab Results  Component Value Date   HGBA1C 6.6 (A) 05/09/2024   HGBA1C 6.4 (A) 11/27/2023   HGBA1C 6.4 (A) 06/02/2023    Latest Reference Range & Units 03/14/24 13:50  Sodium 135 - 145 mEq/L 139  Potassium 3.5 - 5.1 mEq/L 3.8  Chloride 96 - 112 mEq/L 105  CO2 19 - 32 mEq/L 25  Glucose 70 - 99 mg/dL 896 (H)  BUN 6 - 23 mg/dL 11  Creatinine 9.59 - 8.79 mg/dL 9.26  Calcium  8.4 - 10.5 mg/dL 9.6  Alkaline Phosphatase 39 - 117 U/L 74  Albumin 3.5 - 5.2 g/dL 4.9  AST 0 - 37 U/L 21  ALT 0 -  35 U/L 22  Total Protein 6.0 - 8.3 g/dL 7.8  Total Bilirubin 0.2 - 1.2 mg/dL 0.4  GFR >39.99 mL/min 88.98    Latest Reference Range & Units 03/14/24 13:50  Total CHOL/HDL Ratio  3  Cholesterol 0 - 200 mg/dL 844  HDL Cholesterol >60.99 mg/dL 46.29  LDL (calc) 0 - 99 mg/dL 70  MICROALB/CREAT RATIO 0.0 - 30.0 mg/g Unable to calculate  NonHDL  100.90  Triglycerides 0.0 - 149.0 mg/dL 845.9 (H)  VLDL 0.0 - 59.9 mg/dL 69.1     Latest Reference Range & Units 03/14/24 13:50  Creatinine,U mg/dL 76.7  Microalb, Ur mg/dL <9.2  MICROALB/CREAT RATIO 0.0 - 30.0 mg/g Unable to calculate     ASSESSMENT / PLAN / RECOMMENDATIONS:   1) Type 2 Diabetes Mellitus, Optimally  controlled, Without complications - Most recent A1c of 6.6 %. Goal A1c < 7.0 %.    -A1c is optimal -Intolerant to Ozempic  due to nausea and severe constipation -Intolerant to Mounjaro  due to skin rash at the site of injection - Patient opted to try GLP-1 agonist, the only option left is Trulicity , I did caution the patient against rash, and GI side effects   MEDICATIONS:  Continue Jardiance  25 mg daily Start Trulicity  0.75 mg weekly   EDUCATION / INSTRUCTIONS: BG monitoring instructions: Patient is instructed to check her blood sugars 2-3 times a week. Call [facility] if: BG persistently < 70  I reviewed the Rule of 15 for the treatment of hypoglycemia in detail with the patient. Literature supplied.   2) Diabetic complications:  Eye: Does not have known diabetic retinopathy.  Neuro/ Feet: Does not have known diabetic peripheral neuropathy. Renal: Patient does  not have known baseline CKD.     Follow-up in 6 months  Signed electronically by: Stefano Redgie Butts, MD  [facility]  [facility] [address] [place], [place] [address] Phone: [phone] FAX: [phone]   CC: Amon Aloysius BRAVO, MD [address] [place] [address] Phone:  [phone]  Fax: [phone]    Return to Endocrinology clinic as below: Future Appointments  Date Time Provider Department Center  08/19/2024  9:20 AM Karis Clunes, MD [facility] None  09/13/2024  9:00 AM Amon Aloysius BRAVO, MD [facility] [id number]

## 2024-05-09 NOTE — Patient Instructions (Signed)
 Start Trulicity  0.75 mg once weekly   Continue Jardiance  25 mg daily      HOW TO TREAT LOW BLOOD SUGARS (Blood sugar LESS THAN 70 MG/DL) Please follow the RULE OF 15 for the treatment of hypoglycemia treatment (when your (blood sugars are less than 70 mg/dL)   STEP 1: Take 15 grams of carbohydrates when your blood sugar is low, which includes:  3-4 GLUCOSE TABS  OR 3-4 OZ OF JUICE OR REGULAR SODA OR ONE TUBE OF GLUCOSE GEL    STEP 2: RECHECK blood sugar in 15 MINUTES STEP 3: If your blood sugar is still low at the 15 minute recheck --> then, go back to STEP 1 and treat AGAIN with another 15 grams of carbohydrates.

## 2024-05-23 ENCOUNTER — Telehealth: Payer: Self-pay | Admitting: Internal Medicine

## 2024-05-23 ENCOUNTER — Other Ambulatory Visit: Payer: Self-pay | Admitting: Internal Medicine

## 2024-05-23 NOTE — Telephone Encounter (Signed)
 Patient is calling to say that   [facility] [address] [place], [place] - [address] (Ph: [phone])  Is telling her that her Dulaglutide  (TRULICITY ) 0.75 MG/0.5ML SOAJ needs a prior authorization.

## 2024-05-24 ENCOUNTER — Telehealth: Payer: Self-pay

## 2024-05-24 ENCOUNTER — Other Ambulatory Visit (HOSPITAL_COMMUNITY): Payer: Self-pay

## 2024-05-24 NOTE — Telephone Encounter (Signed)
 Pharmacy Patient Advocate Encounter   Received notification from Pt Calls Messages that prior authorization for Trulicity  0.75mg /0.55ml is required/requested.   Insurance verification completed.   The patient is insured through [facility].   Per test claim: PA required; PA submitted to above mentioned insurance via Latent Key/confirmation #/EOC [id number] Status is pending

## 2024-06-02 NOTE — Telephone Encounter (Signed)
 Pharmacy Patient Advocate Encounter  Received notification from [facility] that Prior Authorization for Trulicity  0.75mg /0.20ml  has been APPROVED from 12-09-205 to 05-24-2025   PA #/Case ID/Reference #: [id number]

## 2024-06-14 ENCOUNTER — Ambulatory Visit: Admitting: Internal Medicine

## 2024-06-14 DIAGNOSIS — Z01419 Encounter for gynecological examination (general) (routine) without abnormal findings: Secondary | ICD-10-CM | POA: Diagnosis not present

## 2024-06-14 DIAGNOSIS — Z1331 Encounter for screening for depression: Secondary | ICD-10-CM | POA: Diagnosis not present

## 2024-06-14 DIAGNOSIS — Z124 Encounter for screening for malignant neoplasm of cervix: Secondary | ICD-10-CM | POA: Diagnosis not present

## 2024-07-19 ENCOUNTER — Other Ambulatory Visit: Payer: Self-pay | Admitting: Internal Medicine

## 2024-07-19 DIAGNOSIS — E119 Type 2 diabetes mellitus without complications: Secondary | ICD-10-CM

## 2024-08-19 ENCOUNTER — Ambulatory Visit (INDEPENDENT_AMBULATORY_CARE_PROVIDER_SITE_OTHER): Admitting: Otolaryngology

## 2024-09-13 ENCOUNTER — Ambulatory Visit: Admitting: Internal Medicine

## 2024-11-08 ENCOUNTER — Ambulatory Visit: Admitting: Internal Medicine
